# Patient Record
Sex: Female | Born: 1935
Health system: Southern US, Community
[De-identification: ages and names within clinical notes are randomized; demographics above are authoritative.]

## PROBLEM LIST (undated history)

## (undated) DIAGNOSIS — E079 Disorder of thyroid, unspecified: Secondary | ICD-10-CM

## (undated) DIAGNOSIS — N189 Chronic kidney disease, unspecified: Secondary | ICD-10-CM

## (undated) DIAGNOSIS — F32A Depression, unspecified: Secondary | ICD-10-CM

## (undated) DIAGNOSIS — I10 Essential (primary) hypertension: Secondary | ICD-10-CM

## (undated) DIAGNOSIS — L309 Dermatitis, unspecified: Secondary | ICD-10-CM

## (undated) DIAGNOSIS — F419 Anxiety disorder, unspecified: Secondary | ICD-10-CM

## (undated) DIAGNOSIS — F0390 Unspecified dementia without behavioral disturbance: Secondary | ICD-10-CM

## (undated) DIAGNOSIS — C801 Malignant (primary) neoplasm, unspecified: Secondary | ICD-10-CM

## (undated) DIAGNOSIS — J45909 Unspecified asthma, uncomplicated: Secondary | ICD-10-CM

## (undated) DIAGNOSIS — I639 Cerebral infarction, unspecified: Secondary | ICD-10-CM

## (undated) DIAGNOSIS — F329 Major depressive disorder, single episode, unspecified: Secondary | ICD-10-CM

## (undated) DIAGNOSIS — M1990 Unspecified osteoarthritis, unspecified site: Secondary | ICD-10-CM

## (undated) DIAGNOSIS — K219 Gastro-esophageal reflux disease without esophagitis: Secondary | ICD-10-CM

## (undated) DIAGNOSIS — Z1379 Encounter for other screening for genetic and chromosomal anomalies: Secondary | ICD-10-CM

## (undated) HISTORY — DX: Encounter for other screening for genetic and chromosomal anomalies: Z13.79

## (undated) HISTORY — PX: ABDOMINAL HYSTERECTOMY: SHX81

## (undated) HISTORY — DX: Malignant (primary) neoplasm, unspecified: C80.1

## (undated) HISTORY — PX: TONSILLECTOMY: SUR1361

## (undated) HISTORY — PX: CHOLECYSTECTOMY: SHX55

## (undated) HISTORY — PX: CATARACT EXTRACTION: SUR2

## (undated) HISTORY — PX: BREAST LUMPECTOMY: SHX2

## (undated) HISTORY — PX: LAMINECTOMY: SHX219

---

## 2004-10-11 ENCOUNTER — Ambulatory Visit: Payer: Self-pay

## 2004-11-28 ENCOUNTER — Other Ambulatory Visit: Payer: Self-pay

## 2004-12-13 ENCOUNTER — Inpatient Hospital Stay: Payer: Self-pay | Admitting: Orthopedic Surgery

## 2005-04-12 ENCOUNTER — Other Ambulatory Visit: Payer: Self-pay

## 2005-04-12 ENCOUNTER — Emergency Department: Payer: Self-pay | Admitting: Emergency Medicine

## 2005-05-14 ENCOUNTER — Other Ambulatory Visit: Payer: Self-pay

## 2005-05-14 ENCOUNTER — Emergency Department: Payer: Self-pay | Admitting: Emergency Medicine

## 2005-05-15 ENCOUNTER — Ambulatory Visit: Payer: Self-pay | Admitting: Emergency Medicine

## 2005-07-13 ENCOUNTER — Ambulatory Visit: Payer: Self-pay | Admitting: Internal Medicine

## 2005-10-17 ENCOUNTER — Ambulatory Visit: Payer: Self-pay | Admitting: Gastroenterology

## 2005-12-17 ENCOUNTER — Emergency Department: Payer: Self-pay | Admitting: Emergency Medicine

## 2006-01-10 ENCOUNTER — Ambulatory Visit: Payer: Self-pay | Admitting: Gastroenterology

## 2006-08-06 ENCOUNTER — Ambulatory Visit: Payer: Self-pay | Admitting: Internal Medicine

## 2006-08-07 ENCOUNTER — Emergency Department: Payer: Self-pay | Admitting: Emergency Medicine

## 2007-03-28 ENCOUNTER — Ambulatory Visit: Payer: Self-pay | Admitting: Internal Medicine

## 2007-09-16 ENCOUNTER — Ambulatory Visit: Payer: Self-pay | Admitting: Internal Medicine

## 2008-07-31 ENCOUNTER — Ambulatory Visit: Payer: Self-pay | Admitting: Specialist

## 2008-08-12 ENCOUNTER — Ambulatory Visit: Payer: Self-pay | Admitting: Specialist

## 2009-03-25 ENCOUNTER — Ambulatory Visit: Payer: Self-pay | Admitting: Internal Medicine

## 2009-11-10 ENCOUNTER — Ambulatory Visit: Payer: Self-pay | Admitting: Gastroenterology

## 2009-12-08 ENCOUNTER — Ambulatory Visit: Payer: Self-pay | Admitting: Gastroenterology

## 2010-03-01 ENCOUNTER — Ambulatory Visit: Payer: Self-pay | Admitting: Specialist

## 2010-03-31 ENCOUNTER — Ambulatory Visit: Payer: Self-pay | Admitting: Internal Medicine

## 2010-09-28 ENCOUNTER — Inpatient Hospital Stay: Payer: Self-pay | Admitting: Internal Medicine

## 2010-10-27 ENCOUNTER — Ambulatory Visit: Payer: Self-pay | Admitting: Nephrology

## 2011-05-10 DIAGNOSIS — M6281 Muscle weakness (generalized): Secondary | ICD-10-CM | POA: Diagnosis not present

## 2011-05-10 DIAGNOSIS — I10 Essential (primary) hypertension: Secondary | ICD-10-CM | POA: Diagnosis not present

## 2011-05-10 DIAGNOSIS — IMO0001 Reserved for inherently not codable concepts without codable children: Secondary | ICD-10-CM | POA: Diagnosis not present

## 2011-05-10 DIAGNOSIS — R262 Difficulty in walking, not elsewhere classified: Secondary | ICD-10-CM | POA: Diagnosis not present

## 2011-05-10 DIAGNOSIS — R569 Unspecified convulsions: Secondary | ICD-10-CM | POA: Diagnosis not present

## 2011-05-10 DIAGNOSIS — G309 Alzheimer's disease, unspecified: Secondary | ICD-10-CM | POA: Diagnosis not present

## 2011-05-10 DIAGNOSIS — J449 Chronic obstructive pulmonary disease, unspecified: Secondary | ICD-10-CM | POA: Diagnosis not present

## 2011-05-10 DIAGNOSIS — F0280 Dementia in other diseases classified elsewhere without behavioral disturbance: Secondary | ICD-10-CM | POA: Diagnosis not present

## 2011-05-10 DIAGNOSIS — Z87440 Personal history of urinary (tract) infections: Secondary | ICD-10-CM | POA: Diagnosis not present

## 2011-05-14 DIAGNOSIS — I10 Essential (primary) hypertension: Secondary | ICD-10-CM | POA: Diagnosis not present

## 2011-05-14 DIAGNOSIS — IMO0001 Reserved for inherently not codable concepts without codable children: Secondary | ICD-10-CM | POA: Diagnosis not present

## 2011-05-14 DIAGNOSIS — R262 Difficulty in walking, not elsewhere classified: Secondary | ICD-10-CM | POA: Diagnosis not present

## 2011-05-14 DIAGNOSIS — M6281 Muscle weakness (generalized): Secondary | ICD-10-CM | POA: Diagnosis not present

## 2011-05-14 DIAGNOSIS — J449 Chronic obstructive pulmonary disease, unspecified: Secondary | ICD-10-CM | POA: Diagnosis not present

## 2011-05-14 DIAGNOSIS — G309 Alzheimer's disease, unspecified: Secondary | ICD-10-CM | POA: Diagnosis not present

## 2011-05-16 DIAGNOSIS — G309 Alzheimer's disease, unspecified: Secondary | ICD-10-CM | POA: Diagnosis not present

## 2011-05-16 DIAGNOSIS — I10 Essential (primary) hypertension: Secondary | ICD-10-CM | POA: Diagnosis not present

## 2011-05-16 DIAGNOSIS — J449 Chronic obstructive pulmonary disease, unspecified: Secondary | ICD-10-CM | POA: Diagnosis not present

## 2011-05-16 DIAGNOSIS — IMO0001 Reserved for inherently not codable concepts without codable children: Secondary | ICD-10-CM | POA: Diagnosis not present

## 2011-05-16 DIAGNOSIS — M6281 Muscle weakness (generalized): Secondary | ICD-10-CM | POA: Diagnosis not present

## 2011-05-16 DIAGNOSIS — R262 Difficulty in walking, not elsewhere classified: Secondary | ICD-10-CM | POA: Diagnosis not present

## 2011-05-22 DIAGNOSIS — IMO0001 Reserved for inherently not codable concepts without codable children: Secondary | ICD-10-CM | POA: Diagnosis not present

## 2011-05-22 DIAGNOSIS — R262 Difficulty in walking, not elsewhere classified: Secondary | ICD-10-CM | POA: Diagnosis not present

## 2011-05-22 DIAGNOSIS — G309 Alzheimer's disease, unspecified: Secondary | ICD-10-CM | POA: Diagnosis not present

## 2011-05-22 DIAGNOSIS — I10 Essential (primary) hypertension: Secondary | ICD-10-CM | POA: Diagnosis not present

## 2011-05-22 DIAGNOSIS — J449 Chronic obstructive pulmonary disease, unspecified: Secondary | ICD-10-CM | POA: Diagnosis not present

## 2011-05-22 DIAGNOSIS — M6281 Muscle weakness (generalized): Secondary | ICD-10-CM | POA: Diagnosis not present

## 2011-05-24 DIAGNOSIS — J449 Chronic obstructive pulmonary disease, unspecified: Secondary | ICD-10-CM | POA: Diagnosis not present

## 2011-05-24 DIAGNOSIS — F0280 Dementia in other diseases classified elsewhere without behavioral disturbance: Secondary | ICD-10-CM | POA: Diagnosis not present

## 2011-05-24 DIAGNOSIS — R262 Difficulty in walking, not elsewhere classified: Secondary | ICD-10-CM | POA: Diagnosis not present

## 2011-05-24 DIAGNOSIS — I10 Essential (primary) hypertension: Secondary | ICD-10-CM | POA: Diagnosis not present

## 2011-05-24 DIAGNOSIS — IMO0001 Reserved for inherently not codable concepts without codable children: Secondary | ICD-10-CM | POA: Diagnosis not present

## 2011-05-24 DIAGNOSIS — M6281 Muscle weakness (generalized): Secondary | ICD-10-CM | POA: Diagnosis not present

## 2011-05-25 DIAGNOSIS — IMO0001 Reserved for inherently not codable concepts without codable children: Secondary | ICD-10-CM | POA: Diagnosis not present

## 2011-05-25 DIAGNOSIS — J449 Chronic obstructive pulmonary disease, unspecified: Secondary | ICD-10-CM | POA: Diagnosis not present

## 2011-05-25 DIAGNOSIS — I10 Essential (primary) hypertension: Secondary | ICD-10-CM | POA: Diagnosis not present

## 2011-05-25 DIAGNOSIS — M6281 Muscle weakness (generalized): Secondary | ICD-10-CM | POA: Diagnosis not present

## 2011-05-25 DIAGNOSIS — R262 Difficulty in walking, not elsewhere classified: Secondary | ICD-10-CM | POA: Diagnosis not present

## 2011-05-25 DIAGNOSIS — G309 Alzheimer's disease, unspecified: Secondary | ICD-10-CM | POA: Diagnosis not present

## 2011-05-30 DIAGNOSIS — IMO0001 Reserved for inherently not codable concepts without codable children: Secondary | ICD-10-CM | POA: Diagnosis not present

## 2011-05-30 DIAGNOSIS — M6281 Muscle weakness (generalized): Secondary | ICD-10-CM | POA: Diagnosis not present

## 2011-05-30 DIAGNOSIS — G309 Alzheimer's disease, unspecified: Secondary | ICD-10-CM | POA: Diagnosis not present

## 2011-05-30 DIAGNOSIS — I10 Essential (primary) hypertension: Secondary | ICD-10-CM | POA: Diagnosis not present

## 2011-05-30 DIAGNOSIS — R262 Difficulty in walking, not elsewhere classified: Secondary | ICD-10-CM | POA: Diagnosis not present

## 2011-05-30 DIAGNOSIS — J449 Chronic obstructive pulmonary disease, unspecified: Secondary | ICD-10-CM | POA: Diagnosis not present

## 2011-05-31 DIAGNOSIS — G309 Alzheimer's disease, unspecified: Secondary | ICD-10-CM | POA: Diagnosis not present

## 2011-05-31 DIAGNOSIS — IMO0001 Reserved for inherently not codable concepts without codable children: Secondary | ICD-10-CM | POA: Diagnosis not present

## 2011-05-31 DIAGNOSIS — R262 Difficulty in walking, not elsewhere classified: Secondary | ICD-10-CM | POA: Diagnosis not present

## 2011-05-31 DIAGNOSIS — J449 Chronic obstructive pulmonary disease, unspecified: Secondary | ICD-10-CM | POA: Diagnosis not present

## 2011-05-31 DIAGNOSIS — I10 Essential (primary) hypertension: Secondary | ICD-10-CM | POA: Diagnosis not present

## 2011-05-31 DIAGNOSIS — M6281 Muscle weakness (generalized): Secondary | ICD-10-CM | POA: Diagnosis not present

## 2011-06-01 DIAGNOSIS — R262 Difficulty in walking, not elsewhere classified: Secondary | ICD-10-CM | POA: Diagnosis not present

## 2011-06-01 DIAGNOSIS — G309 Alzheimer's disease, unspecified: Secondary | ICD-10-CM | POA: Diagnosis not present

## 2011-06-01 DIAGNOSIS — I10 Essential (primary) hypertension: Secondary | ICD-10-CM | POA: Diagnosis not present

## 2011-06-01 DIAGNOSIS — J449 Chronic obstructive pulmonary disease, unspecified: Secondary | ICD-10-CM | POA: Diagnosis not present

## 2011-06-01 DIAGNOSIS — M6281 Muscle weakness (generalized): Secondary | ICD-10-CM | POA: Diagnosis not present

## 2011-06-01 DIAGNOSIS — IMO0001 Reserved for inherently not codable concepts without codable children: Secondary | ICD-10-CM | POA: Diagnosis not present

## 2011-06-05 DIAGNOSIS — F0280 Dementia in other diseases classified elsewhere without behavioral disturbance: Secondary | ICD-10-CM | POA: Diagnosis not present

## 2011-06-05 DIAGNOSIS — I10 Essential (primary) hypertension: Secondary | ICD-10-CM | POA: Diagnosis not present

## 2011-06-05 DIAGNOSIS — J449 Chronic obstructive pulmonary disease, unspecified: Secondary | ICD-10-CM | POA: Diagnosis not present

## 2011-06-05 DIAGNOSIS — R262 Difficulty in walking, not elsewhere classified: Secondary | ICD-10-CM | POA: Diagnosis not present

## 2011-06-05 DIAGNOSIS — IMO0001 Reserved for inherently not codable concepts without codable children: Secondary | ICD-10-CM | POA: Diagnosis not present

## 2011-06-05 DIAGNOSIS — M6281 Muscle weakness (generalized): Secondary | ICD-10-CM | POA: Diagnosis not present

## 2011-06-06 DIAGNOSIS — IMO0001 Reserved for inherently not codable concepts without codable children: Secondary | ICD-10-CM | POA: Diagnosis not present

## 2011-06-06 DIAGNOSIS — I10 Essential (primary) hypertension: Secondary | ICD-10-CM | POA: Diagnosis not present

## 2011-06-06 DIAGNOSIS — G309 Alzheimer's disease, unspecified: Secondary | ICD-10-CM | POA: Diagnosis not present

## 2011-06-06 DIAGNOSIS — M6281 Muscle weakness (generalized): Secondary | ICD-10-CM | POA: Diagnosis not present

## 2011-06-06 DIAGNOSIS — R262 Difficulty in walking, not elsewhere classified: Secondary | ICD-10-CM | POA: Diagnosis not present

## 2011-06-06 DIAGNOSIS — J449 Chronic obstructive pulmonary disease, unspecified: Secondary | ICD-10-CM | POA: Diagnosis not present

## 2011-06-14 DIAGNOSIS — M6281 Muscle weakness (generalized): Secondary | ICD-10-CM | POA: Diagnosis not present

## 2011-06-14 DIAGNOSIS — IMO0001 Reserved for inherently not codable concepts without codable children: Secondary | ICD-10-CM | POA: Diagnosis not present

## 2011-06-14 DIAGNOSIS — J449 Chronic obstructive pulmonary disease, unspecified: Secondary | ICD-10-CM | POA: Diagnosis not present

## 2011-06-14 DIAGNOSIS — G309 Alzheimer's disease, unspecified: Secondary | ICD-10-CM | POA: Diagnosis not present

## 2011-06-14 DIAGNOSIS — I10 Essential (primary) hypertension: Secondary | ICD-10-CM | POA: Diagnosis not present

## 2011-06-14 DIAGNOSIS — R262 Difficulty in walking, not elsewhere classified: Secondary | ICD-10-CM | POA: Diagnosis not present

## 2011-06-15 DIAGNOSIS — IMO0001 Reserved for inherently not codable concepts without codable children: Secondary | ICD-10-CM | POA: Diagnosis not present

## 2011-06-15 DIAGNOSIS — J449 Chronic obstructive pulmonary disease, unspecified: Secondary | ICD-10-CM | POA: Diagnosis not present

## 2011-06-15 DIAGNOSIS — M6281 Muscle weakness (generalized): Secondary | ICD-10-CM | POA: Diagnosis not present

## 2011-06-15 DIAGNOSIS — R262 Difficulty in walking, not elsewhere classified: Secondary | ICD-10-CM | POA: Diagnosis not present

## 2011-06-15 DIAGNOSIS — I10 Essential (primary) hypertension: Secondary | ICD-10-CM | POA: Diagnosis not present

## 2011-06-15 DIAGNOSIS — G309 Alzheimer's disease, unspecified: Secondary | ICD-10-CM | POA: Diagnosis not present

## 2011-06-16 DIAGNOSIS — R262 Difficulty in walking, not elsewhere classified: Secondary | ICD-10-CM | POA: Diagnosis not present

## 2011-06-16 DIAGNOSIS — IMO0001 Reserved for inherently not codable concepts without codable children: Secondary | ICD-10-CM | POA: Diagnosis not present

## 2011-06-16 DIAGNOSIS — J449 Chronic obstructive pulmonary disease, unspecified: Secondary | ICD-10-CM | POA: Diagnosis not present

## 2011-06-16 DIAGNOSIS — M6281 Muscle weakness (generalized): Secondary | ICD-10-CM | POA: Diagnosis not present

## 2011-06-16 DIAGNOSIS — F0280 Dementia in other diseases classified elsewhere without behavioral disturbance: Secondary | ICD-10-CM | POA: Diagnosis not present

## 2011-06-16 DIAGNOSIS — I10 Essential (primary) hypertension: Secondary | ICD-10-CM | POA: Diagnosis not present

## 2011-06-21 DIAGNOSIS — IMO0001 Reserved for inherently not codable concepts without codable children: Secondary | ICD-10-CM | POA: Diagnosis not present

## 2011-06-21 DIAGNOSIS — R262 Difficulty in walking, not elsewhere classified: Secondary | ICD-10-CM | POA: Diagnosis not present

## 2011-06-21 DIAGNOSIS — J449 Chronic obstructive pulmonary disease, unspecified: Secondary | ICD-10-CM | POA: Diagnosis not present

## 2011-06-21 DIAGNOSIS — I10 Essential (primary) hypertension: Secondary | ICD-10-CM | POA: Diagnosis not present

## 2011-06-21 DIAGNOSIS — F0280 Dementia in other diseases classified elsewhere without behavioral disturbance: Secondary | ICD-10-CM | POA: Diagnosis not present

## 2011-06-21 DIAGNOSIS — M6281 Muscle weakness (generalized): Secondary | ICD-10-CM | POA: Diagnosis not present

## 2011-06-28 DIAGNOSIS — F0280 Dementia in other diseases classified elsewhere without behavioral disturbance: Secondary | ICD-10-CM | POA: Diagnosis not present

## 2011-06-28 DIAGNOSIS — J449 Chronic obstructive pulmonary disease, unspecified: Secondary | ICD-10-CM | POA: Diagnosis not present

## 2011-06-28 DIAGNOSIS — R262 Difficulty in walking, not elsewhere classified: Secondary | ICD-10-CM | POA: Diagnosis not present

## 2011-06-28 DIAGNOSIS — M6281 Muscle weakness (generalized): Secondary | ICD-10-CM | POA: Diagnosis not present

## 2011-06-28 DIAGNOSIS — IMO0001 Reserved for inherently not codable concepts without codable children: Secondary | ICD-10-CM | POA: Diagnosis not present

## 2011-06-28 DIAGNOSIS — I10 Essential (primary) hypertension: Secondary | ICD-10-CM | POA: Diagnosis not present

## 2011-07-05 DIAGNOSIS — J449 Chronic obstructive pulmonary disease, unspecified: Secondary | ICD-10-CM | POA: Diagnosis not present

## 2011-07-05 DIAGNOSIS — G309 Alzheimer's disease, unspecified: Secondary | ICD-10-CM | POA: Diagnosis not present

## 2011-07-05 DIAGNOSIS — M6281 Muscle weakness (generalized): Secondary | ICD-10-CM | POA: Diagnosis not present

## 2011-07-05 DIAGNOSIS — IMO0001 Reserved for inherently not codable concepts without codable children: Secondary | ICD-10-CM | POA: Diagnosis not present

## 2011-07-05 DIAGNOSIS — I10 Essential (primary) hypertension: Secondary | ICD-10-CM | POA: Diagnosis not present

## 2011-07-05 DIAGNOSIS — R262 Difficulty in walking, not elsewhere classified: Secondary | ICD-10-CM | POA: Diagnosis not present

## 2011-07-11 DIAGNOSIS — B351 Tinea unguium: Secondary | ICD-10-CM | POA: Diagnosis not present

## 2011-07-11 DIAGNOSIS — M79609 Pain in unspecified limb: Secondary | ICD-10-CM | POA: Diagnosis not present

## 2011-07-14 DIAGNOSIS — M19019 Primary osteoarthritis, unspecified shoulder: Secondary | ICD-10-CM | POA: Diagnosis not present

## 2011-07-14 DIAGNOSIS — S40019A Contusion of unspecified shoulder, initial encounter: Secondary | ICD-10-CM | POA: Diagnosis not present

## 2011-07-18 DIAGNOSIS — L578 Other skin changes due to chronic exposure to nonionizing radiation: Secondary | ICD-10-CM | POA: Diagnosis not present

## 2011-07-18 DIAGNOSIS — L408 Other psoriasis: Secondary | ICD-10-CM | POA: Diagnosis not present

## 2011-07-27 DIAGNOSIS — R609 Edema, unspecified: Secondary | ICD-10-CM | POA: Diagnosis not present

## 2011-07-27 DIAGNOSIS — E782 Mixed hyperlipidemia: Secondary | ICD-10-CM | POA: Diagnosis not present

## 2011-07-27 DIAGNOSIS — N393 Stress incontinence (female) (male): Secondary | ICD-10-CM | POA: Diagnosis not present

## 2011-07-27 DIAGNOSIS — E039 Hypothyroidism, unspecified: Secondary | ICD-10-CM | POA: Diagnosis not present

## 2011-07-27 DIAGNOSIS — I10 Essential (primary) hypertension: Secondary | ICD-10-CM | POA: Diagnosis not present

## 2011-07-27 DIAGNOSIS — K219 Gastro-esophageal reflux disease without esophagitis: Secondary | ICD-10-CM | POA: Diagnosis not present

## 2011-08-22 DIAGNOSIS — J45909 Unspecified asthma, uncomplicated: Secondary | ICD-10-CM | POA: Diagnosis not present

## 2011-08-22 DIAGNOSIS — R5383 Other fatigue: Secondary | ICD-10-CM | POA: Diagnosis not present

## 2011-08-22 DIAGNOSIS — H6690 Otitis media, unspecified, unspecified ear: Secondary | ICD-10-CM | POA: Diagnosis not present

## 2011-08-22 DIAGNOSIS — I10 Essential (primary) hypertension: Secondary | ICD-10-CM | POA: Diagnosis not present

## 2011-08-22 DIAGNOSIS — F329 Major depressive disorder, single episode, unspecified: Secondary | ICD-10-CM | POA: Diagnosis not present

## 2011-08-22 DIAGNOSIS — F411 Generalized anxiety disorder: Secondary | ICD-10-CM | POA: Diagnosis not present

## 2011-08-22 DIAGNOSIS — R300 Dysuria: Secondary | ICD-10-CM | POA: Diagnosis not present

## 2011-08-22 DIAGNOSIS — R5381 Other malaise: Secondary | ICD-10-CM | POA: Diagnosis not present

## 2011-08-28 DIAGNOSIS — N3946 Mixed incontinence: Secondary | ICD-10-CM | POA: Diagnosis not present

## 2011-09-01 DIAGNOSIS — E559 Vitamin D deficiency, unspecified: Secondary | ICD-10-CM | POA: Diagnosis not present

## 2011-09-01 DIAGNOSIS — R609 Edema, unspecified: Secondary | ICD-10-CM | POA: Diagnosis not present

## 2011-09-01 DIAGNOSIS — I10 Essential (primary) hypertension: Secondary | ICD-10-CM | POA: Diagnosis not present

## 2011-09-06 DIAGNOSIS — N390 Urinary tract infection, site not specified: Secondary | ICD-10-CM | POA: Diagnosis not present

## 2011-09-11 DIAGNOSIS — M79609 Pain in unspecified limb: Secondary | ICD-10-CM | POA: Diagnosis not present

## 2011-09-11 DIAGNOSIS — B351 Tinea unguium: Secondary | ICD-10-CM | POA: Diagnosis not present

## 2011-09-12 DIAGNOSIS — R262 Difficulty in walking, not elsewhere classified: Secondary | ICD-10-CM | POA: Diagnosis not present

## 2011-09-12 DIAGNOSIS — R32 Unspecified urinary incontinence: Secondary | ICD-10-CM | POA: Diagnosis not present

## 2011-09-12 DIAGNOSIS — L909 Atrophic disorder of skin, unspecified: Secondary | ICD-10-CM | POA: Diagnosis not present

## 2011-09-12 DIAGNOSIS — N390 Urinary tract infection, site not specified: Secondary | ICD-10-CM | POA: Diagnosis not present

## 2011-09-12 DIAGNOSIS — R609 Edema, unspecified: Secondary | ICD-10-CM | POA: Diagnosis not present

## 2011-09-12 DIAGNOSIS — M1990 Unspecified osteoarthritis, unspecified site: Secondary | ICD-10-CM | POA: Diagnosis not present

## 2011-09-12 DIAGNOSIS — F0280 Dementia in other diseases classified elsewhere without behavioral disturbance: Secondary | ICD-10-CM | POA: Diagnosis not present

## 2011-09-12 DIAGNOSIS — J449 Chronic obstructive pulmonary disease, unspecified: Secondary | ICD-10-CM | POA: Diagnosis not present

## 2011-09-12 DIAGNOSIS — M810 Age-related osteoporosis without current pathological fracture: Secondary | ICD-10-CM | POA: Diagnosis not present

## 2011-09-12 DIAGNOSIS — Z8673 Personal history of transient ischemic attack (TIA), and cerebral infarction without residual deficits: Secondary | ICD-10-CM | POA: Diagnosis not present

## 2011-09-12 DIAGNOSIS — M6281 Muscle weakness (generalized): Secondary | ICD-10-CM | POA: Diagnosis not present

## 2011-09-12 DIAGNOSIS — F411 Generalized anxiety disorder: Secondary | ICD-10-CM | POA: Diagnosis not present

## 2011-09-12 DIAGNOSIS — Z9181 History of falling: Secondary | ICD-10-CM | POA: Diagnosis not present

## 2011-09-12 DIAGNOSIS — G309 Alzheimer's disease, unspecified: Secondary | ICD-10-CM | POA: Diagnosis not present

## 2011-09-13 DIAGNOSIS — R609 Edema, unspecified: Secondary | ICD-10-CM | POA: Diagnosis not present

## 2011-09-13 DIAGNOSIS — M1990 Unspecified osteoarthritis, unspecified site: Secondary | ICD-10-CM | POA: Diagnosis not present

## 2011-09-13 DIAGNOSIS — G309 Alzheimer's disease, unspecified: Secondary | ICD-10-CM | POA: Diagnosis not present

## 2011-09-13 DIAGNOSIS — F0280 Dementia in other diseases classified elsewhere without behavioral disturbance: Secondary | ICD-10-CM | POA: Diagnosis not present

## 2011-09-13 DIAGNOSIS — J449 Chronic obstructive pulmonary disease, unspecified: Secondary | ICD-10-CM | POA: Diagnosis not present

## 2011-09-13 DIAGNOSIS — N390 Urinary tract infection, site not specified: Secondary | ICD-10-CM | POA: Diagnosis not present

## 2011-09-14 ENCOUNTER — Ambulatory Visit: Payer: Self-pay | Admitting: Internal Medicine

## 2011-09-14 DIAGNOSIS — F0280 Dementia in other diseases classified elsewhere without behavioral disturbance: Secondary | ICD-10-CM | POA: Diagnosis not present

## 2011-09-14 DIAGNOSIS — N390 Urinary tract infection, site not specified: Secondary | ICD-10-CM | POA: Diagnosis not present

## 2011-09-14 DIAGNOSIS — N6489 Other specified disorders of breast: Secondary | ICD-10-CM | POA: Diagnosis not present

## 2011-09-14 DIAGNOSIS — R609 Edema, unspecified: Secondary | ICD-10-CM | POA: Diagnosis not present

## 2011-09-14 DIAGNOSIS — J449 Chronic obstructive pulmonary disease, unspecified: Secondary | ICD-10-CM | POA: Diagnosis not present

## 2011-09-14 DIAGNOSIS — Z853 Personal history of malignant neoplasm of breast: Secondary | ICD-10-CM | POA: Diagnosis not present

## 2011-09-14 DIAGNOSIS — G309 Alzheimer's disease, unspecified: Secondary | ICD-10-CM | POA: Diagnosis not present

## 2011-09-14 DIAGNOSIS — M1990 Unspecified osteoarthritis, unspecified site: Secondary | ICD-10-CM | POA: Diagnosis not present

## 2011-09-15 DIAGNOSIS — R609 Edema, unspecified: Secondary | ICD-10-CM | POA: Diagnosis not present

## 2011-09-15 DIAGNOSIS — M1990 Unspecified osteoarthritis, unspecified site: Secondary | ICD-10-CM | POA: Diagnosis not present

## 2011-09-15 DIAGNOSIS — F0280 Dementia in other diseases classified elsewhere without behavioral disturbance: Secondary | ICD-10-CM | POA: Diagnosis not present

## 2011-09-15 DIAGNOSIS — J449 Chronic obstructive pulmonary disease, unspecified: Secondary | ICD-10-CM | POA: Diagnosis not present

## 2011-09-15 DIAGNOSIS — N390 Urinary tract infection, site not specified: Secondary | ICD-10-CM | POA: Diagnosis not present

## 2011-09-18 DIAGNOSIS — N3946 Mixed incontinence: Secondary | ICD-10-CM | POA: Diagnosis not present

## 2011-09-18 DIAGNOSIS — F0280 Dementia in other diseases classified elsewhere without behavioral disturbance: Secondary | ICD-10-CM | POA: Diagnosis not present

## 2011-09-18 DIAGNOSIS — M1990 Unspecified osteoarthritis, unspecified site: Secondary | ICD-10-CM | POA: Diagnosis not present

## 2011-09-18 DIAGNOSIS — R609 Edema, unspecified: Secondary | ICD-10-CM | POA: Diagnosis not present

## 2011-09-18 DIAGNOSIS — N390 Urinary tract infection, site not specified: Secondary | ICD-10-CM | POA: Diagnosis not present

## 2011-09-18 DIAGNOSIS — J449 Chronic obstructive pulmonary disease, unspecified: Secondary | ICD-10-CM | POA: Diagnosis not present

## 2011-09-19 DIAGNOSIS — F0280 Dementia in other diseases classified elsewhere without behavioral disturbance: Secondary | ICD-10-CM | POA: Diagnosis not present

## 2011-09-19 DIAGNOSIS — N390 Urinary tract infection, site not specified: Secondary | ICD-10-CM | POA: Diagnosis not present

## 2011-09-19 DIAGNOSIS — M1990 Unspecified osteoarthritis, unspecified site: Secondary | ICD-10-CM | POA: Diagnosis not present

## 2011-09-19 DIAGNOSIS — R609 Edema, unspecified: Secondary | ICD-10-CM | POA: Diagnosis not present

## 2011-09-19 DIAGNOSIS — J449 Chronic obstructive pulmonary disease, unspecified: Secondary | ICD-10-CM | POA: Diagnosis not present

## 2011-09-21 DIAGNOSIS — G309 Alzheimer's disease, unspecified: Secondary | ICD-10-CM | POA: Diagnosis not present

## 2011-09-21 DIAGNOSIS — F0280 Dementia in other diseases classified elsewhere without behavioral disturbance: Secondary | ICD-10-CM | POA: Diagnosis not present

## 2011-09-21 DIAGNOSIS — R609 Edema, unspecified: Secondary | ICD-10-CM | POA: Diagnosis not present

## 2011-09-21 DIAGNOSIS — M1990 Unspecified osteoarthritis, unspecified site: Secondary | ICD-10-CM | POA: Diagnosis not present

## 2011-09-21 DIAGNOSIS — J449 Chronic obstructive pulmonary disease, unspecified: Secondary | ICD-10-CM | POA: Diagnosis not present

## 2011-09-21 DIAGNOSIS — N390 Urinary tract infection, site not specified: Secondary | ICD-10-CM | POA: Diagnosis not present

## 2011-09-22 DIAGNOSIS — G309 Alzheimer's disease, unspecified: Secondary | ICD-10-CM | POA: Diagnosis not present

## 2011-09-22 DIAGNOSIS — F0280 Dementia in other diseases classified elsewhere without behavioral disturbance: Secondary | ICD-10-CM | POA: Diagnosis not present

## 2011-09-22 DIAGNOSIS — M1990 Unspecified osteoarthritis, unspecified site: Secondary | ICD-10-CM | POA: Diagnosis not present

## 2011-09-22 DIAGNOSIS — J449 Chronic obstructive pulmonary disease, unspecified: Secondary | ICD-10-CM | POA: Diagnosis not present

## 2011-09-22 DIAGNOSIS — R609 Edema, unspecified: Secondary | ICD-10-CM | POA: Diagnosis not present

## 2011-09-22 DIAGNOSIS — N390 Urinary tract infection, site not specified: Secondary | ICD-10-CM | POA: Diagnosis not present

## 2011-09-25 DIAGNOSIS — J449 Chronic obstructive pulmonary disease, unspecified: Secondary | ICD-10-CM | POA: Diagnosis not present

## 2011-09-25 DIAGNOSIS — F0280 Dementia in other diseases classified elsewhere without behavioral disturbance: Secondary | ICD-10-CM | POA: Diagnosis not present

## 2011-09-25 DIAGNOSIS — N390 Urinary tract infection, site not specified: Secondary | ICD-10-CM | POA: Diagnosis not present

## 2011-09-25 DIAGNOSIS — M1990 Unspecified osteoarthritis, unspecified site: Secondary | ICD-10-CM | POA: Diagnosis not present

## 2011-09-25 DIAGNOSIS — R609 Edema, unspecified: Secondary | ICD-10-CM | POA: Diagnosis not present

## 2011-09-27 DIAGNOSIS — R609 Edema, unspecified: Secondary | ICD-10-CM | POA: Diagnosis not present

## 2011-09-27 DIAGNOSIS — J449 Chronic obstructive pulmonary disease, unspecified: Secondary | ICD-10-CM | POA: Diagnosis not present

## 2011-09-27 DIAGNOSIS — F0280 Dementia in other diseases classified elsewhere without behavioral disturbance: Secondary | ICD-10-CM | POA: Diagnosis not present

## 2011-09-27 DIAGNOSIS — M1990 Unspecified osteoarthritis, unspecified site: Secondary | ICD-10-CM | POA: Diagnosis not present

## 2011-09-27 DIAGNOSIS — G309 Alzheimer's disease, unspecified: Secondary | ICD-10-CM | POA: Diagnosis not present

## 2011-09-27 DIAGNOSIS — N390 Urinary tract infection, site not specified: Secondary | ICD-10-CM | POA: Diagnosis not present

## 2011-10-02 DIAGNOSIS — I10 Essential (primary) hypertension: Secondary | ICD-10-CM | POA: Diagnosis not present

## 2011-10-02 DIAGNOSIS — F329 Major depressive disorder, single episode, unspecified: Secondary | ICD-10-CM | POA: Diagnosis not present

## 2011-10-03 DIAGNOSIS — F0280 Dementia in other diseases classified elsewhere without behavioral disturbance: Secondary | ICD-10-CM | POA: Diagnosis not present

## 2011-10-03 DIAGNOSIS — R609 Edema, unspecified: Secondary | ICD-10-CM | POA: Diagnosis not present

## 2011-10-03 DIAGNOSIS — M1990 Unspecified osteoarthritis, unspecified site: Secondary | ICD-10-CM | POA: Diagnosis not present

## 2011-10-03 DIAGNOSIS — J449 Chronic obstructive pulmonary disease, unspecified: Secondary | ICD-10-CM | POA: Diagnosis not present

## 2011-10-03 DIAGNOSIS — N390 Urinary tract infection, site not specified: Secondary | ICD-10-CM | POA: Diagnosis not present

## 2011-10-04 DIAGNOSIS — J449 Chronic obstructive pulmonary disease, unspecified: Secondary | ICD-10-CM | POA: Diagnosis not present

## 2011-10-04 DIAGNOSIS — M1990 Unspecified osteoarthritis, unspecified site: Secondary | ICD-10-CM | POA: Diagnosis not present

## 2011-10-04 DIAGNOSIS — N390 Urinary tract infection, site not specified: Secondary | ICD-10-CM | POA: Diagnosis not present

## 2011-10-04 DIAGNOSIS — G309 Alzheimer's disease, unspecified: Secondary | ICD-10-CM | POA: Diagnosis not present

## 2011-10-04 DIAGNOSIS — R609 Edema, unspecified: Secondary | ICD-10-CM | POA: Diagnosis not present

## 2011-10-04 DIAGNOSIS — F0280 Dementia in other diseases classified elsewhere without behavioral disturbance: Secondary | ICD-10-CM | POA: Diagnosis not present

## 2011-10-10 DIAGNOSIS — G309 Alzheimer's disease, unspecified: Secondary | ICD-10-CM | POA: Diagnosis not present

## 2011-10-10 DIAGNOSIS — M1990 Unspecified osteoarthritis, unspecified site: Secondary | ICD-10-CM | POA: Diagnosis not present

## 2011-10-10 DIAGNOSIS — N390 Urinary tract infection, site not specified: Secondary | ICD-10-CM | POA: Diagnosis not present

## 2011-10-10 DIAGNOSIS — J449 Chronic obstructive pulmonary disease, unspecified: Secondary | ICD-10-CM | POA: Diagnosis not present

## 2011-10-10 DIAGNOSIS — F0280 Dementia in other diseases classified elsewhere without behavioral disturbance: Secondary | ICD-10-CM | POA: Diagnosis not present

## 2011-10-10 DIAGNOSIS — R609 Edema, unspecified: Secondary | ICD-10-CM | POA: Diagnosis not present

## 2011-10-11 DIAGNOSIS — J449 Chronic obstructive pulmonary disease, unspecified: Secondary | ICD-10-CM | POA: Diagnosis not present

## 2011-10-11 DIAGNOSIS — R609 Edema, unspecified: Secondary | ICD-10-CM | POA: Diagnosis not present

## 2011-10-11 DIAGNOSIS — M1990 Unspecified osteoarthritis, unspecified site: Secondary | ICD-10-CM | POA: Diagnosis not present

## 2011-10-11 DIAGNOSIS — F0280 Dementia in other diseases classified elsewhere without behavioral disturbance: Secondary | ICD-10-CM | POA: Diagnosis not present

## 2011-10-11 DIAGNOSIS — N390 Urinary tract infection, site not specified: Secondary | ICD-10-CM | POA: Diagnosis not present

## 2011-10-16 DIAGNOSIS — H353290 Exudative age-related macular degeneration, unspecified eye, stage unspecified: Secondary | ICD-10-CM | POA: Diagnosis not present

## 2011-10-17 DIAGNOSIS — R609 Edema, unspecified: Secondary | ICD-10-CM | POA: Diagnosis not present

## 2011-10-17 DIAGNOSIS — F0280 Dementia in other diseases classified elsewhere without behavioral disturbance: Secondary | ICD-10-CM | POA: Diagnosis not present

## 2011-10-17 DIAGNOSIS — N390 Urinary tract infection, site not specified: Secondary | ICD-10-CM | POA: Diagnosis not present

## 2011-10-17 DIAGNOSIS — M1990 Unspecified osteoarthritis, unspecified site: Secondary | ICD-10-CM | POA: Diagnosis not present

## 2011-10-17 DIAGNOSIS — J449 Chronic obstructive pulmonary disease, unspecified: Secondary | ICD-10-CM | POA: Diagnosis not present

## 2011-10-18 DIAGNOSIS — R609 Edema, unspecified: Secondary | ICD-10-CM | POA: Diagnosis not present

## 2011-10-18 DIAGNOSIS — M1990 Unspecified osteoarthritis, unspecified site: Secondary | ICD-10-CM | POA: Diagnosis not present

## 2011-10-18 DIAGNOSIS — J449 Chronic obstructive pulmonary disease, unspecified: Secondary | ICD-10-CM | POA: Diagnosis not present

## 2011-10-18 DIAGNOSIS — N390 Urinary tract infection, site not specified: Secondary | ICD-10-CM | POA: Diagnosis not present

## 2011-10-18 DIAGNOSIS — F0280 Dementia in other diseases classified elsewhere without behavioral disturbance: Secondary | ICD-10-CM | POA: Diagnosis not present

## 2011-10-23 DIAGNOSIS — R609 Edema, unspecified: Secondary | ICD-10-CM | POA: Diagnosis not present

## 2011-10-23 DIAGNOSIS — M1990 Unspecified osteoarthritis, unspecified site: Secondary | ICD-10-CM | POA: Diagnosis not present

## 2011-10-23 DIAGNOSIS — N390 Urinary tract infection, site not specified: Secondary | ICD-10-CM | POA: Diagnosis not present

## 2011-10-23 DIAGNOSIS — J449 Chronic obstructive pulmonary disease, unspecified: Secondary | ICD-10-CM | POA: Diagnosis not present

## 2011-10-23 DIAGNOSIS — F0280 Dementia in other diseases classified elsewhere without behavioral disturbance: Secondary | ICD-10-CM | POA: Diagnosis not present

## 2011-10-25 DIAGNOSIS — N390 Urinary tract infection, site not specified: Secondary | ICD-10-CM | POA: Diagnosis not present

## 2011-10-25 DIAGNOSIS — R609 Edema, unspecified: Secondary | ICD-10-CM | POA: Diagnosis not present

## 2011-10-25 DIAGNOSIS — J449 Chronic obstructive pulmonary disease, unspecified: Secondary | ICD-10-CM | POA: Diagnosis not present

## 2011-10-25 DIAGNOSIS — F0280 Dementia in other diseases classified elsewhere without behavioral disturbance: Secondary | ICD-10-CM | POA: Diagnosis not present

## 2011-10-25 DIAGNOSIS — M1990 Unspecified osteoarthritis, unspecified site: Secondary | ICD-10-CM | POA: Diagnosis not present

## 2011-10-31 DIAGNOSIS — M1990 Unspecified osteoarthritis, unspecified site: Secondary | ICD-10-CM | POA: Diagnosis not present

## 2011-10-31 DIAGNOSIS — N390 Urinary tract infection, site not specified: Secondary | ICD-10-CM | POA: Diagnosis not present

## 2011-10-31 DIAGNOSIS — R609 Edema, unspecified: Secondary | ICD-10-CM | POA: Diagnosis not present

## 2011-10-31 DIAGNOSIS — F0280 Dementia in other diseases classified elsewhere without behavioral disturbance: Secondary | ICD-10-CM | POA: Diagnosis not present

## 2011-10-31 DIAGNOSIS — J449 Chronic obstructive pulmonary disease, unspecified: Secondary | ICD-10-CM | POA: Diagnosis not present

## 2011-10-31 DIAGNOSIS — G309 Alzheimer's disease, unspecified: Secondary | ICD-10-CM | POA: Diagnosis not present

## 2011-11-01 DIAGNOSIS — J449 Chronic obstructive pulmonary disease, unspecified: Secondary | ICD-10-CM | POA: Diagnosis not present

## 2011-11-01 DIAGNOSIS — E782 Mixed hyperlipidemia: Secondary | ICD-10-CM | POA: Diagnosis not present

## 2011-11-01 DIAGNOSIS — M1990 Unspecified osteoarthritis, unspecified site: Secondary | ICD-10-CM | POA: Diagnosis not present

## 2011-11-01 DIAGNOSIS — R609 Edema, unspecified: Secondary | ICD-10-CM | POA: Diagnosis not present

## 2011-11-01 DIAGNOSIS — E559 Vitamin D deficiency, unspecified: Secondary | ICD-10-CM | POA: Diagnosis not present

## 2011-11-01 DIAGNOSIS — F0280 Dementia in other diseases classified elsewhere without behavioral disturbance: Secondary | ICD-10-CM | POA: Diagnosis not present

## 2011-11-01 DIAGNOSIS — I10 Essential (primary) hypertension: Secondary | ICD-10-CM | POA: Diagnosis not present

## 2011-11-01 DIAGNOSIS — R062 Wheezing: Secondary | ICD-10-CM | POA: Diagnosis not present

## 2011-11-01 DIAGNOSIS — E039 Hypothyroidism, unspecified: Secondary | ICD-10-CM | POA: Diagnosis not present

## 2011-11-01 DIAGNOSIS — N390 Urinary tract infection, site not specified: Secondary | ICD-10-CM | POA: Diagnosis not present

## 2011-11-01 DIAGNOSIS — J45909 Unspecified asthma, uncomplicated: Secondary | ICD-10-CM | POA: Diagnosis not present

## 2011-11-01 DIAGNOSIS — R0602 Shortness of breath: Secondary | ICD-10-CM | POA: Diagnosis not present

## 2011-11-07 DIAGNOSIS — J449 Chronic obstructive pulmonary disease, unspecified: Secondary | ICD-10-CM | POA: Diagnosis not present

## 2011-11-07 DIAGNOSIS — R609 Edema, unspecified: Secondary | ICD-10-CM | POA: Diagnosis not present

## 2011-11-07 DIAGNOSIS — N390 Urinary tract infection, site not specified: Secondary | ICD-10-CM | POA: Diagnosis not present

## 2011-11-07 DIAGNOSIS — F0280 Dementia in other diseases classified elsewhere without behavioral disturbance: Secondary | ICD-10-CM | POA: Diagnosis not present

## 2011-11-07 DIAGNOSIS — M1990 Unspecified osteoarthritis, unspecified site: Secondary | ICD-10-CM | POA: Diagnosis not present

## 2011-11-08 DIAGNOSIS — F0280 Dementia in other diseases classified elsewhere without behavioral disturbance: Secondary | ICD-10-CM | POA: Diagnosis not present

## 2011-11-08 DIAGNOSIS — M1990 Unspecified osteoarthritis, unspecified site: Secondary | ICD-10-CM | POA: Diagnosis not present

## 2011-11-08 DIAGNOSIS — R609 Edema, unspecified: Secondary | ICD-10-CM | POA: Diagnosis not present

## 2011-11-08 DIAGNOSIS — N390 Urinary tract infection, site not specified: Secondary | ICD-10-CM | POA: Diagnosis not present

## 2011-11-08 DIAGNOSIS — J449 Chronic obstructive pulmonary disease, unspecified: Secondary | ICD-10-CM | POA: Diagnosis not present

## 2011-11-11 DIAGNOSIS — Z8673 Personal history of transient ischemic attack (TIA), and cerebral infarction without residual deficits: Secondary | ICD-10-CM | POA: Diagnosis not present

## 2011-11-11 DIAGNOSIS — M810 Age-related osteoporosis without current pathological fracture: Secondary | ICD-10-CM | POA: Diagnosis not present

## 2011-11-11 DIAGNOSIS — J440 Chronic obstructive pulmonary disease with acute lower respiratory infection: Secondary | ICD-10-CM | POA: Diagnosis not present

## 2011-11-11 DIAGNOSIS — R262 Difficulty in walking, not elsewhere classified: Secondary | ICD-10-CM | POA: Diagnosis not present

## 2011-11-11 DIAGNOSIS — Z87440 Personal history of urinary (tract) infections: Secondary | ICD-10-CM | POA: Diagnosis not present

## 2011-11-11 DIAGNOSIS — I10 Essential (primary) hypertension: Secondary | ICD-10-CM | POA: Diagnosis not present

## 2011-11-11 DIAGNOSIS — F411 Generalized anxiety disorder: Secondary | ICD-10-CM | POA: Diagnosis not present

## 2011-11-11 DIAGNOSIS — M1990 Unspecified osteoarthritis, unspecified site: Secondary | ICD-10-CM | POA: Diagnosis not present

## 2011-11-11 DIAGNOSIS — IMO0001 Reserved for inherently not codable concepts without codable children: Secondary | ICD-10-CM | POA: Diagnosis not present

## 2011-11-11 DIAGNOSIS — R279 Unspecified lack of coordination: Secondary | ICD-10-CM | POA: Diagnosis not present

## 2011-11-11 DIAGNOSIS — Z9181 History of falling: Secondary | ICD-10-CM | POA: Diagnosis not present

## 2011-11-11 DIAGNOSIS — F0280 Dementia in other diseases classified elsewhere without behavioral disturbance: Secondary | ICD-10-CM | POA: Diagnosis not present

## 2011-11-17 DIAGNOSIS — R279 Unspecified lack of coordination: Secondary | ICD-10-CM | POA: Diagnosis not present

## 2011-11-17 DIAGNOSIS — IMO0001 Reserved for inherently not codable concepts without codable children: Secondary | ICD-10-CM | POA: Diagnosis not present

## 2011-11-17 DIAGNOSIS — R262 Difficulty in walking, not elsewhere classified: Secondary | ICD-10-CM | POA: Diagnosis not present

## 2011-11-17 DIAGNOSIS — J440 Chronic obstructive pulmonary disease with acute lower respiratory infection: Secondary | ICD-10-CM | POA: Diagnosis not present

## 2011-11-17 DIAGNOSIS — F0280 Dementia in other diseases classified elsewhere without behavioral disturbance: Secondary | ICD-10-CM | POA: Diagnosis not present

## 2011-11-20 DIAGNOSIS — F0280 Dementia in other diseases classified elsewhere without behavioral disturbance: Secondary | ICD-10-CM | POA: Diagnosis not present

## 2011-11-20 DIAGNOSIS — G309 Alzheimer's disease, unspecified: Secondary | ICD-10-CM | POA: Diagnosis not present

## 2011-11-20 DIAGNOSIS — IMO0001 Reserved for inherently not codable concepts without codable children: Secondary | ICD-10-CM | POA: Diagnosis not present

## 2011-11-20 DIAGNOSIS — J440 Chronic obstructive pulmonary disease with acute lower respiratory infection: Secondary | ICD-10-CM | POA: Diagnosis not present

## 2011-11-20 DIAGNOSIS — R262 Difficulty in walking, not elsewhere classified: Secondary | ICD-10-CM | POA: Diagnosis not present

## 2011-11-20 DIAGNOSIS — R279 Unspecified lack of coordination: Secondary | ICD-10-CM | POA: Diagnosis not present

## 2011-11-23 DIAGNOSIS — R279 Unspecified lack of coordination: Secondary | ICD-10-CM | POA: Diagnosis not present

## 2011-11-23 DIAGNOSIS — J440 Chronic obstructive pulmonary disease with acute lower respiratory infection: Secondary | ICD-10-CM | POA: Diagnosis not present

## 2011-11-23 DIAGNOSIS — F0280 Dementia in other diseases classified elsewhere without behavioral disturbance: Secondary | ICD-10-CM | POA: Diagnosis not present

## 2011-11-23 DIAGNOSIS — W010XXA Fall on same level from slipping, tripping and stumbling without subsequent striking against object, initial encounter: Secondary | ICD-10-CM | POA: Diagnosis not present

## 2011-11-23 DIAGNOSIS — IMO0001 Reserved for inherently not codable concepts without codable children: Secondary | ICD-10-CM | POA: Diagnosis not present

## 2011-11-23 DIAGNOSIS — R262 Difficulty in walking, not elsewhere classified: Secondary | ICD-10-CM | POA: Diagnosis not present

## 2011-11-23 DIAGNOSIS — IMO0002 Reserved for concepts with insufficient information to code with codable children: Secondary | ICD-10-CM | POA: Diagnosis not present

## 2011-11-28 DIAGNOSIS — J440 Chronic obstructive pulmonary disease with acute lower respiratory infection: Secondary | ICD-10-CM | POA: Diagnosis not present

## 2011-11-28 DIAGNOSIS — IMO0001 Reserved for inherently not codable concepts without codable children: Secondary | ICD-10-CM | POA: Diagnosis not present

## 2011-11-28 DIAGNOSIS — R279 Unspecified lack of coordination: Secondary | ICD-10-CM | POA: Diagnosis not present

## 2011-11-28 DIAGNOSIS — F0280 Dementia in other diseases classified elsewhere without behavioral disturbance: Secondary | ICD-10-CM | POA: Diagnosis not present

## 2011-11-28 DIAGNOSIS — R262 Difficulty in walking, not elsewhere classified: Secondary | ICD-10-CM | POA: Diagnosis not present

## 2011-11-29 DIAGNOSIS — IMO0001 Reserved for inherently not codable concepts without codable children: Secondary | ICD-10-CM | POA: Diagnosis not present

## 2011-11-29 DIAGNOSIS — R279 Unspecified lack of coordination: Secondary | ICD-10-CM | POA: Diagnosis not present

## 2011-11-29 DIAGNOSIS — R262 Difficulty in walking, not elsewhere classified: Secondary | ICD-10-CM | POA: Diagnosis not present

## 2011-11-29 DIAGNOSIS — F0280 Dementia in other diseases classified elsewhere without behavioral disturbance: Secondary | ICD-10-CM | POA: Diagnosis not present

## 2011-11-29 DIAGNOSIS — G309 Alzheimer's disease, unspecified: Secondary | ICD-10-CM | POA: Diagnosis not present

## 2011-11-29 DIAGNOSIS — J440 Chronic obstructive pulmonary disease with acute lower respiratory infection: Secondary | ICD-10-CM | POA: Diagnosis not present

## 2011-11-30 DIAGNOSIS — W19XXXA Unspecified fall, initial encounter: Secondary | ICD-10-CM | POA: Diagnosis not present

## 2011-11-30 DIAGNOSIS — J309 Allergic rhinitis, unspecified: Secondary | ICD-10-CM | POA: Diagnosis not present

## 2011-11-30 DIAGNOSIS — IMO0002 Reserved for concepts with insufficient information to code with codable children: Secondary | ICD-10-CM | POA: Diagnosis not present

## 2011-12-04 DIAGNOSIS — R262 Difficulty in walking, not elsewhere classified: Secondary | ICD-10-CM | POA: Diagnosis not present

## 2011-12-04 DIAGNOSIS — F0280 Dementia in other diseases classified elsewhere without behavioral disturbance: Secondary | ICD-10-CM | POA: Diagnosis not present

## 2011-12-04 DIAGNOSIS — R279 Unspecified lack of coordination: Secondary | ICD-10-CM | POA: Diagnosis not present

## 2011-12-04 DIAGNOSIS — IMO0001 Reserved for inherently not codable concepts without codable children: Secondary | ICD-10-CM | POA: Diagnosis not present

## 2011-12-04 DIAGNOSIS — J440 Chronic obstructive pulmonary disease with acute lower respiratory infection: Secondary | ICD-10-CM | POA: Diagnosis not present

## 2011-12-06 DIAGNOSIS — J440 Chronic obstructive pulmonary disease with acute lower respiratory infection: Secondary | ICD-10-CM | POA: Diagnosis not present

## 2011-12-06 DIAGNOSIS — R262 Difficulty in walking, not elsewhere classified: Secondary | ICD-10-CM | POA: Diagnosis not present

## 2011-12-06 DIAGNOSIS — R279 Unspecified lack of coordination: Secondary | ICD-10-CM | POA: Diagnosis not present

## 2011-12-06 DIAGNOSIS — IMO0001 Reserved for inherently not codable concepts without codable children: Secondary | ICD-10-CM | POA: Diagnosis not present

## 2011-12-06 DIAGNOSIS — G309 Alzheimer's disease, unspecified: Secondary | ICD-10-CM | POA: Diagnosis not present

## 2011-12-06 DIAGNOSIS — F0280 Dementia in other diseases classified elsewhere without behavioral disturbance: Secondary | ICD-10-CM | POA: Diagnosis not present

## 2011-12-12 DIAGNOSIS — F0280 Dementia in other diseases classified elsewhere without behavioral disturbance: Secondary | ICD-10-CM | POA: Diagnosis not present

## 2011-12-12 DIAGNOSIS — R262 Difficulty in walking, not elsewhere classified: Secondary | ICD-10-CM | POA: Diagnosis not present

## 2011-12-12 DIAGNOSIS — IMO0001 Reserved for inherently not codable concepts without codable children: Secondary | ICD-10-CM | POA: Diagnosis not present

## 2011-12-12 DIAGNOSIS — R279 Unspecified lack of coordination: Secondary | ICD-10-CM | POA: Diagnosis not present

## 2011-12-12 DIAGNOSIS — IMO0002 Reserved for concepts with insufficient information to code with codable children: Secondary | ICD-10-CM | POA: Diagnosis not present

## 2011-12-12 DIAGNOSIS — J440 Chronic obstructive pulmonary disease with acute lower respiratory infection: Secondary | ICD-10-CM | POA: Diagnosis not present

## 2011-12-14 DIAGNOSIS — R279 Unspecified lack of coordination: Secondary | ICD-10-CM | POA: Diagnosis not present

## 2011-12-14 DIAGNOSIS — F0280 Dementia in other diseases classified elsewhere without behavioral disturbance: Secondary | ICD-10-CM | POA: Diagnosis not present

## 2011-12-14 DIAGNOSIS — R262 Difficulty in walking, not elsewhere classified: Secondary | ICD-10-CM | POA: Diagnosis not present

## 2011-12-14 DIAGNOSIS — J440 Chronic obstructive pulmonary disease with acute lower respiratory infection: Secondary | ICD-10-CM | POA: Diagnosis not present

## 2011-12-14 DIAGNOSIS — IMO0001 Reserved for inherently not codable concepts without codable children: Secondary | ICD-10-CM | POA: Diagnosis not present

## 2011-12-19 DIAGNOSIS — E559 Vitamin D deficiency, unspecified: Secondary | ICD-10-CM | POA: Diagnosis not present

## 2011-12-19 DIAGNOSIS — F0280 Dementia in other diseases classified elsewhere without behavioral disturbance: Secondary | ICD-10-CM | POA: Diagnosis not present

## 2011-12-19 DIAGNOSIS — R279 Unspecified lack of coordination: Secondary | ICD-10-CM | POA: Diagnosis not present

## 2011-12-19 DIAGNOSIS — J440 Chronic obstructive pulmonary disease with acute lower respiratory infection: Secondary | ICD-10-CM | POA: Diagnosis not present

## 2011-12-19 DIAGNOSIS — R609 Edema, unspecified: Secondary | ICD-10-CM | POA: Diagnosis not present

## 2011-12-19 DIAGNOSIS — I10 Essential (primary) hypertension: Secondary | ICD-10-CM | POA: Diagnosis not present

## 2011-12-19 DIAGNOSIS — R262 Difficulty in walking, not elsewhere classified: Secondary | ICD-10-CM | POA: Diagnosis not present

## 2011-12-19 DIAGNOSIS — IMO0001 Reserved for inherently not codable concepts without codable children: Secondary | ICD-10-CM | POA: Diagnosis not present

## 2011-12-22 DIAGNOSIS — R262 Difficulty in walking, not elsewhere classified: Secondary | ICD-10-CM | POA: Diagnosis not present

## 2011-12-22 DIAGNOSIS — G309 Alzheimer's disease, unspecified: Secondary | ICD-10-CM | POA: Diagnosis not present

## 2011-12-22 DIAGNOSIS — R279 Unspecified lack of coordination: Secondary | ICD-10-CM | POA: Diagnosis not present

## 2011-12-22 DIAGNOSIS — F0280 Dementia in other diseases classified elsewhere without behavioral disturbance: Secondary | ICD-10-CM | POA: Diagnosis not present

## 2011-12-22 DIAGNOSIS — IMO0001 Reserved for inherently not codable concepts without codable children: Secondary | ICD-10-CM | POA: Diagnosis not present

## 2011-12-22 DIAGNOSIS — J440 Chronic obstructive pulmonary disease with acute lower respiratory infection: Secondary | ICD-10-CM | POA: Diagnosis not present

## 2011-12-27 DIAGNOSIS — R262 Difficulty in walking, not elsewhere classified: Secondary | ICD-10-CM | POA: Diagnosis not present

## 2011-12-27 DIAGNOSIS — J440 Chronic obstructive pulmonary disease with acute lower respiratory infection: Secondary | ICD-10-CM | POA: Diagnosis not present

## 2011-12-27 DIAGNOSIS — R279 Unspecified lack of coordination: Secondary | ICD-10-CM | POA: Diagnosis not present

## 2011-12-27 DIAGNOSIS — F0280 Dementia in other diseases classified elsewhere without behavioral disturbance: Secondary | ICD-10-CM | POA: Diagnosis not present

## 2011-12-27 DIAGNOSIS — IMO0001 Reserved for inherently not codable concepts without codable children: Secondary | ICD-10-CM | POA: Diagnosis not present

## 2011-12-28 DIAGNOSIS — J440 Chronic obstructive pulmonary disease with acute lower respiratory infection: Secondary | ICD-10-CM | POA: Diagnosis not present

## 2011-12-28 DIAGNOSIS — G309 Alzheimer's disease, unspecified: Secondary | ICD-10-CM | POA: Diagnosis not present

## 2011-12-28 DIAGNOSIS — R262 Difficulty in walking, not elsewhere classified: Secondary | ICD-10-CM | POA: Diagnosis not present

## 2011-12-28 DIAGNOSIS — R279 Unspecified lack of coordination: Secondary | ICD-10-CM | POA: Diagnosis not present

## 2011-12-28 DIAGNOSIS — IMO0001 Reserved for inherently not codable concepts without codable children: Secondary | ICD-10-CM | POA: Diagnosis not present

## 2011-12-28 DIAGNOSIS — F0280 Dementia in other diseases classified elsewhere without behavioral disturbance: Secondary | ICD-10-CM | POA: Diagnosis not present

## 2012-01-05 DIAGNOSIS — J449 Chronic obstructive pulmonary disease, unspecified: Secondary | ICD-10-CM | POA: Diagnosis not present

## 2012-01-09 DIAGNOSIS — M159 Polyosteoarthritis, unspecified: Secondary | ICD-10-CM | POA: Diagnosis not present

## 2012-01-09 DIAGNOSIS — I10 Essential (primary) hypertension: Secondary | ICD-10-CM | POA: Diagnosis not present

## 2012-01-09 DIAGNOSIS — H8110 Benign paroxysmal vertigo, unspecified ear: Secondary | ICD-10-CM | POA: Diagnosis not present

## 2012-01-09 DIAGNOSIS — J45909 Unspecified asthma, uncomplicated: Secondary | ICD-10-CM | POA: Diagnosis not present

## 2012-01-09 DIAGNOSIS — N390 Urinary tract infection, site not specified: Secondary | ICD-10-CM | POA: Diagnosis not present

## 2012-01-09 DIAGNOSIS — R5381 Other malaise: Secondary | ICD-10-CM | POA: Diagnosis not present

## 2012-01-22 DIAGNOSIS — R609 Edema, unspecified: Secondary | ICD-10-CM | POA: Diagnosis not present

## 2012-01-22 DIAGNOSIS — R0602 Shortness of breath: Secondary | ICD-10-CM | POA: Diagnosis not present

## 2012-01-22 DIAGNOSIS — N3946 Mixed incontinence: Secondary | ICD-10-CM | POA: Diagnosis not present

## 2012-01-22 DIAGNOSIS — J45909 Unspecified asthma, uncomplicated: Secondary | ICD-10-CM | POA: Diagnosis not present

## 2012-02-01 DIAGNOSIS — M79609 Pain in unspecified limb: Secondary | ICD-10-CM | POA: Diagnosis not present

## 2012-02-01 DIAGNOSIS — B351 Tinea unguium: Secondary | ICD-10-CM | POA: Diagnosis not present

## 2012-02-02 DIAGNOSIS — R0602 Shortness of breath: Secondary | ICD-10-CM | POA: Diagnosis not present

## 2012-02-14 DIAGNOSIS — J449 Chronic obstructive pulmonary disease, unspecified: Secondary | ICD-10-CM | POA: Diagnosis not present

## 2012-02-15 DIAGNOSIS — N390 Urinary tract infection, site not specified: Secondary | ICD-10-CM | POA: Diagnosis not present

## 2012-02-15 DIAGNOSIS — D485 Neoplasm of uncertain behavior of skin: Secondary | ICD-10-CM | POA: Diagnosis not present

## 2012-02-15 DIAGNOSIS — F411 Generalized anxiety disorder: Secondary | ICD-10-CM | POA: Diagnosis not present

## 2012-02-15 DIAGNOSIS — R300 Dysuria: Secondary | ICD-10-CM | POA: Diagnosis not present

## 2012-03-07 DIAGNOSIS — N390 Urinary tract infection, site not specified: Secondary | ICD-10-CM | POA: Diagnosis not present

## 2012-03-28 DIAGNOSIS — R609 Edema, unspecified: Secondary | ICD-10-CM | POA: Diagnosis not present

## 2012-03-28 DIAGNOSIS — J45909 Unspecified asthma, uncomplicated: Secondary | ICD-10-CM | POA: Diagnosis not present

## 2012-03-28 DIAGNOSIS — I288 Other diseases of pulmonary vessels: Secondary | ICD-10-CM | POA: Diagnosis not present

## 2012-04-05 DIAGNOSIS — B351 Tinea unguium: Secondary | ICD-10-CM | POA: Diagnosis not present

## 2012-04-05 DIAGNOSIS — M79609 Pain in unspecified limb: Secondary | ICD-10-CM | POA: Diagnosis not present

## 2012-04-10 DIAGNOSIS — R300 Dysuria: Secondary | ICD-10-CM | POA: Diagnosis not present

## 2012-04-12 DIAGNOSIS — F329 Major depressive disorder, single episode, unspecified: Secondary | ICD-10-CM | POA: Diagnosis not present

## 2012-04-12 DIAGNOSIS — F0281 Dementia in other diseases classified elsewhere with behavioral disturbance: Secondary | ICD-10-CM | POA: Diagnosis not present

## 2012-04-12 DIAGNOSIS — E039 Hypothyroidism, unspecified: Secondary | ICD-10-CM | POA: Diagnosis not present

## 2012-04-12 DIAGNOSIS — R5381 Other malaise: Secondary | ICD-10-CM | POA: Diagnosis not present

## 2012-04-12 DIAGNOSIS — G4700 Insomnia, unspecified: Secondary | ICD-10-CM | POA: Diagnosis not present

## 2012-04-12 DIAGNOSIS — R443 Hallucinations, unspecified: Secondary | ICD-10-CM | POA: Diagnosis not present

## 2012-04-29 DIAGNOSIS — R0602 Shortness of breath: Secondary | ICD-10-CM | POA: Diagnosis not present

## 2012-04-29 DIAGNOSIS — R413 Other amnesia: Secondary | ICD-10-CM | POA: Diagnosis not present

## 2012-04-29 DIAGNOSIS — G4700 Insomnia, unspecified: Secondary | ICD-10-CM | POA: Diagnosis not present

## 2012-04-29 DIAGNOSIS — N189 Chronic kidney disease, unspecified: Secondary | ICD-10-CM | POA: Diagnosis not present

## 2012-04-29 DIAGNOSIS — F0281 Dementia in other diseases classified elsewhere with behavioral disturbance: Secondary | ICD-10-CM | POA: Diagnosis not present

## 2012-04-29 DIAGNOSIS — G4710 Hypersomnia, unspecified: Secondary | ICD-10-CM | POA: Diagnosis not present

## 2012-04-29 DIAGNOSIS — R262 Difficulty in walking, not elsewhere classified: Secondary | ICD-10-CM | POA: Diagnosis not present

## 2012-04-29 DIAGNOSIS — R443 Hallucinations, unspecified: Secondary | ICD-10-CM | POA: Diagnosis not present

## 2012-04-29 DIAGNOSIS — D649 Anemia, unspecified: Secondary | ICD-10-CM | POA: Diagnosis not present

## 2012-04-30 DIAGNOSIS — F0281 Dementia in other diseases classified elsewhere with behavioral disturbance: Secondary | ICD-10-CM | POA: Diagnosis not present

## 2012-04-30 DIAGNOSIS — R350 Frequency of micturition: Secondary | ICD-10-CM | POA: Diagnosis not present

## 2012-05-02 DIAGNOSIS — Z23 Encounter for immunization: Secondary | ICD-10-CM | POA: Diagnosis not present

## 2012-05-07 DIAGNOSIS — E559 Vitamin D deficiency, unspecified: Secondary | ICD-10-CM | POA: Diagnosis not present

## 2012-05-07 DIAGNOSIS — D631 Anemia in chronic kidney disease: Secondary | ICD-10-CM | POA: Diagnosis not present

## 2012-05-07 DIAGNOSIS — N039 Chronic nephritic syndrome with unspecified morphologic changes: Secondary | ICD-10-CM | POA: Diagnosis not present

## 2012-05-07 DIAGNOSIS — R609 Edema, unspecified: Secondary | ICD-10-CM | POA: Diagnosis not present

## 2012-05-07 DIAGNOSIS — I10 Essential (primary) hypertension: Secondary | ICD-10-CM | POA: Diagnosis not present

## 2012-05-09 DIAGNOSIS — H353190 Nonexudative age-related macular degeneration, unspecified eye, stage unspecified: Secondary | ICD-10-CM | POA: Diagnosis not present

## 2012-05-14 DIAGNOSIS — R413 Other amnesia: Secondary | ICD-10-CM | POA: Diagnosis not present

## 2012-05-14 DIAGNOSIS — E039 Hypothyroidism, unspecified: Secondary | ICD-10-CM | POA: Diagnosis not present

## 2012-05-14 DIAGNOSIS — R443 Hallucinations, unspecified: Secondary | ICD-10-CM | POA: Diagnosis not present

## 2012-05-14 DIAGNOSIS — M159 Polyosteoarthritis, unspecified: Secondary | ICD-10-CM | POA: Diagnosis not present

## 2012-05-14 DIAGNOSIS — F329 Major depressive disorder, single episode, unspecified: Secondary | ICD-10-CM | POA: Diagnosis not present

## 2012-05-14 DIAGNOSIS — F068 Other specified mental disorders due to known physiological condition: Secondary | ICD-10-CM | POA: Diagnosis not present

## 2012-05-14 DIAGNOSIS — I10 Essential (primary) hypertension: Secondary | ICD-10-CM | POA: Diagnosis not present

## 2012-05-14 DIAGNOSIS — G4700 Insomnia, unspecified: Secondary | ICD-10-CM | POA: Diagnosis not present

## 2012-06-07 DIAGNOSIS — M79609 Pain in unspecified limb: Secondary | ICD-10-CM | POA: Diagnosis not present

## 2012-06-07 DIAGNOSIS — B351 Tinea unguium: Secondary | ICD-10-CM | POA: Diagnosis not present

## 2012-06-20 DIAGNOSIS — F329 Major depressive disorder, single episode, unspecified: Secondary | ICD-10-CM | POA: Diagnosis not present

## 2012-06-20 DIAGNOSIS — F068 Other specified mental disorders due to known physiological condition: Secondary | ICD-10-CM | POA: Diagnosis not present

## 2012-06-25 DIAGNOSIS — N390 Urinary tract infection, site not specified: Secondary | ICD-10-CM | POA: Diagnosis not present

## 2012-06-25 DIAGNOSIS — D509 Iron deficiency anemia, unspecified: Secondary | ICD-10-CM | POA: Diagnosis not present

## 2012-06-25 DIAGNOSIS — D518 Other vitamin B12 deficiency anemias: Secondary | ICD-10-CM | POA: Diagnosis not present

## 2012-06-25 DIAGNOSIS — E039 Hypothyroidism, unspecified: Secondary | ICD-10-CM | POA: Diagnosis not present

## 2012-06-25 DIAGNOSIS — R300 Dysuria: Secondary | ICD-10-CM | POA: Diagnosis not present

## 2012-06-25 DIAGNOSIS — F0280 Dementia in other diseases classified elsewhere without behavioral disturbance: Secondary | ICD-10-CM | POA: Diagnosis not present

## 2012-07-17 DIAGNOSIS — F068 Other specified mental disorders due to known physiological condition: Secondary | ICD-10-CM | POA: Diagnosis not present

## 2012-07-17 DIAGNOSIS — F329 Major depressive disorder, single episode, unspecified: Secondary | ICD-10-CM | POA: Diagnosis not present

## 2012-07-25 DIAGNOSIS — L03317 Cellulitis of buttock: Secondary | ICD-10-CM | POA: Diagnosis not present

## 2012-07-25 DIAGNOSIS — R262 Difficulty in walking, not elsewhere classified: Secondary | ICD-10-CM | POA: Diagnosis not present

## 2012-07-29 DIAGNOSIS — J449 Chronic obstructive pulmonary disease, unspecified: Secondary | ICD-10-CM | POA: Diagnosis not present

## 2012-07-29 DIAGNOSIS — I10 Essential (primary) hypertension: Secondary | ICD-10-CM | POA: Diagnosis not present

## 2012-07-29 DIAGNOSIS — G309 Alzheimer's disease, unspecified: Secondary | ICD-10-CM | POA: Diagnosis not present

## 2012-07-29 DIAGNOSIS — R609 Edema, unspecified: Secondary | ICD-10-CM | POA: Diagnosis not present

## 2012-07-29 DIAGNOSIS — M1990 Unspecified osteoarthritis, unspecified site: Secondary | ICD-10-CM | POA: Diagnosis not present

## 2012-07-29 DIAGNOSIS — F0280 Dementia in other diseases classified elsewhere without behavioral disturbance: Secondary | ICD-10-CM | POA: Diagnosis not present

## 2012-07-29 DIAGNOSIS — F411 Generalized anxiety disorder: Secondary | ICD-10-CM | POA: Diagnosis not present

## 2012-07-29 DIAGNOSIS — L0231 Cutaneous abscess of buttock: Secondary | ICD-10-CM | POA: Diagnosis not present

## 2012-07-29 DIAGNOSIS — Z9181 History of falling: Secondary | ICD-10-CM | POA: Diagnosis not present

## 2012-07-29 DIAGNOSIS — Z87440 Personal history of urinary (tract) infections: Secondary | ICD-10-CM | POA: Diagnosis not present

## 2012-07-29 DIAGNOSIS — Z9981 Dependence on supplemental oxygen: Secondary | ICD-10-CM | POA: Diagnosis not present

## 2012-07-29 DIAGNOSIS — L03317 Cellulitis of buttock: Secondary | ICD-10-CM | POA: Diagnosis not present

## 2012-08-02 DIAGNOSIS — J449 Chronic obstructive pulmonary disease, unspecified: Secondary | ICD-10-CM | POA: Diagnosis not present

## 2012-08-02 DIAGNOSIS — I10 Essential (primary) hypertension: Secondary | ICD-10-CM | POA: Diagnosis not present

## 2012-08-02 DIAGNOSIS — L0231 Cutaneous abscess of buttock: Secondary | ICD-10-CM | POA: Diagnosis not present

## 2012-08-02 DIAGNOSIS — F0280 Dementia in other diseases classified elsewhere without behavioral disturbance: Secondary | ICD-10-CM | POA: Diagnosis not present

## 2012-08-02 DIAGNOSIS — M1990 Unspecified osteoarthritis, unspecified site: Secondary | ICD-10-CM | POA: Diagnosis not present

## 2012-08-05 DIAGNOSIS — M1990 Unspecified osteoarthritis, unspecified site: Secondary | ICD-10-CM | POA: Diagnosis not present

## 2012-08-05 DIAGNOSIS — F0280 Dementia in other diseases classified elsewhere without behavioral disturbance: Secondary | ICD-10-CM | POA: Diagnosis not present

## 2012-08-05 DIAGNOSIS — J449 Chronic obstructive pulmonary disease, unspecified: Secondary | ICD-10-CM | POA: Diagnosis not present

## 2012-08-05 DIAGNOSIS — I10 Essential (primary) hypertension: Secondary | ICD-10-CM | POA: Diagnosis not present

## 2012-08-05 DIAGNOSIS — L0231 Cutaneous abscess of buttock: Secondary | ICD-10-CM | POA: Diagnosis not present

## 2012-08-12 DIAGNOSIS — R609 Edema, unspecified: Secondary | ICD-10-CM | POA: Diagnosis not present

## 2012-08-12 DIAGNOSIS — J449 Chronic obstructive pulmonary disease, unspecified: Secondary | ICD-10-CM | POA: Diagnosis not present

## 2012-08-12 DIAGNOSIS — L0231 Cutaneous abscess of buttock: Secondary | ICD-10-CM | POA: Diagnosis not present

## 2012-08-12 DIAGNOSIS — G309 Alzheimer's disease, unspecified: Secondary | ICD-10-CM | POA: Diagnosis not present

## 2012-08-12 DIAGNOSIS — I10 Essential (primary) hypertension: Secondary | ICD-10-CM | POA: Diagnosis not present

## 2012-08-12 DIAGNOSIS — F411 Generalized anxiety disorder: Secondary | ICD-10-CM | POA: Diagnosis not present

## 2012-08-12 DIAGNOSIS — M1990 Unspecified osteoarthritis, unspecified site: Secondary | ICD-10-CM | POA: Diagnosis not present

## 2012-08-19 DIAGNOSIS — L0231 Cutaneous abscess of buttock: Secondary | ICD-10-CM | POA: Diagnosis not present

## 2012-08-19 DIAGNOSIS — J449 Chronic obstructive pulmonary disease, unspecified: Secondary | ICD-10-CM | POA: Diagnosis not present

## 2012-08-22 DIAGNOSIS — M79609 Pain in unspecified limb: Secondary | ICD-10-CM | POA: Diagnosis not present

## 2012-08-22 DIAGNOSIS — B351 Tinea unguium: Secondary | ICD-10-CM | POA: Diagnosis not present

## 2012-08-26 DIAGNOSIS — I10 Essential (primary) hypertension: Secondary | ICD-10-CM | POA: Diagnosis not present

## 2012-08-27 DIAGNOSIS — R609 Edema, unspecified: Secondary | ICD-10-CM | POA: Diagnosis not present

## 2012-08-27 DIAGNOSIS — I10 Essential (primary) hypertension: Secondary | ICD-10-CM | POA: Diagnosis not present

## 2012-08-27 DIAGNOSIS — N039 Chronic nephritic syndrome with unspecified morphologic changes: Secondary | ICD-10-CM | POA: Diagnosis not present

## 2012-08-27 DIAGNOSIS — N183 Chronic kidney disease, stage 3 unspecified: Secondary | ICD-10-CM | POA: Diagnosis not present

## 2012-08-29 DIAGNOSIS — L570 Actinic keratosis: Secondary | ICD-10-CM | POA: Diagnosis not present

## 2012-08-29 DIAGNOSIS — L821 Other seborrheic keratosis: Secondary | ICD-10-CM | POA: Diagnosis not present

## 2012-08-29 DIAGNOSIS — L408 Other psoriasis: Secondary | ICD-10-CM | POA: Diagnosis not present

## 2012-08-29 DIAGNOSIS — L578 Other skin changes due to chronic exposure to nonionizing radiation: Secondary | ICD-10-CM | POA: Diagnosis not present

## 2012-09-12 DIAGNOSIS — F329 Major depressive disorder, single episode, unspecified: Secondary | ICD-10-CM | POA: Diagnosis not present

## 2012-09-12 DIAGNOSIS — R300 Dysuria: Secondary | ICD-10-CM | POA: Diagnosis not present

## 2012-09-12 DIAGNOSIS — I10 Essential (primary) hypertension: Secondary | ICD-10-CM | POA: Diagnosis not present

## 2012-09-12 DIAGNOSIS — N390 Urinary tract infection, site not specified: Secondary | ICD-10-CM | POA: Diagnosis not present

## 2012-09-12 DIAGNOSIS — F0280 Dementia in other diseases classified elsewhere without behavioral disturbance: Secondary | ICD-10-CM | POA: Diagnosis not present

## 2012-09-12 DIAGNOSIS — N3946 Mixed incontinence: Secondary | ICD-10-CM | POA: Diagnosis not present

## 2012-09-12 DIAGNOSIS — R413 Other amnesia: Secondary | ICD-10-CM | POA: Diagnosis not present

## 2012-09-12 DIAGNOSIS — N189 Chronic kidney disease, unspecified: Secondary | ICD-10-CM | POA: Diagnosis not present

## 2012-09-12 DIAGNOSIS — I288 Other diseases of pulmonary vessels: Secondary | ICD-10-CM | POA: Diagnosis not present

## 2012-09-12 DIAGNOSIS — E039 Hypothyroidism, unspecified: Secondary | ICD-10-CM | POA: Diagnosis not present

## 2012-09-12 DIAGNOSIS — R262 Difficulty in walking, not elsewhere classified: Secondary | ICD-10-CM | POA: Diagnosis not present

## 2012-09-12 DIAGNOSIS — F068 Other specified mental disorders due to known physiological condition: Secondary | ICD-10-CM | POA: Diagnosis not present

## 2012-10-03 DIAGNOSIS — J45909 Unspecified asthma, uncomplicated: Secondary | ICD-10-CM | POA: Diagnosis not present

## 2012-10-03 DIAGNOSIS — I288 Other diseases of pulmonary vessels: Secondary | ICD-10-CM | POA: Diagnosis not present

## 2012-10-25 DIAGNOSIS — B351 Tinea unguium: Secondary | ICD-10-CM | POA: Diagnosis not present

## 2012-10-25 DIAGNOSIS — M79609 Pain in unspecified limb: Secondary | ICD-10-CM | POA: Diagnosis not present

## 2012-11-07 DIAGNOSIS — H353190 Nonexudative age-related macular degeneration, unspecified eye, stage unspecified: Secondary | ICD-10-CM | POA: Diagnosis not present

## 2012-11-14 DIAGNOSIS — E559 Vitamin D deficiency, unspecified: Secondary | ICD-10-CM | POA: Diagnosis not present

## 2012-11-14 DIAGNOSIS — I10 Essential (primary) hypertension: Secondary | ICD-10-CM | POA: Diagnosis not present

## 2012-11-14 DIAGNOSIS — N3946 Mixed incontinence: Secondary | ICD-10-CM | POA: Diagnosis not present

## 2012-11-14 DIAGNOSIS — N3020 Other chronic cystitis without hematuria: Secondary | ICD-10-CM | POA: Diagnosis not present

## 2012-11-14 DIAGNOSIS — D649 Anemia, unspecified: Secondary | ICD-10-CM | POA: Diagnosis not present

## 2012-11-14 DIAGNOSIS — E039 Hypothyroidism, unspecified: Secondary | ICD-10-CM | POA: Diagnosis not present

## 2012-11-14 DIAGNOSIS — Z0000 Encounter for general adult medical examination without abnormal findings: Secondary | ICD-10-CM | POA: Diagnosis not present

## 2012-11-14 DIAGNOSIS — F0280 Dementia in other diseases classified elsewhere without behavioral disturbance: Secondary | ICD-10-CM | POA: Diagnosis not present

## 2012-11-14 DIAGNOSIS — N189 Chronic kidney disease, unspecified: Secondary | ICD-10-CM | POA: Diagnosis not present

## 2012-11-14 DIAGNOSIS — I288 Other diseases of pulmonary vessels: Secondary | ICD-10-CM | POA: Diagnosis not present

## 2012-12-17 DIAGNOSIS — R609 Edema, unspecified: Secondary | ICD-10-CM | POA: Diagnosis not present

## 2012-12-17 DIAGNOSIS — N039 Chronic nephritic syndrome with unspecified morphologic changes: Secondary | ICD-10-CM | POA: Diagnosis not present

## 2012-12-17 DIAGNOSIS — I10 Essential (primary) hypertension: Secondary | ICD-10-CM | POA: Diagnosis not present

## 2012-12-17 DIAGNOSIS — D631 Anemia in chronic kidney disease: Secondary | ICD-10-CM | POA: Diagnosis not present

## 2013-01-08 DIAGNOSIS — M79609 Pain in unspecified limb: Secondary | ICD-10-CM | POA: Diagnosis not present

## 2013-01-08 DIAGNOSIS — B351 Tinea unguium: Secondary | ICD-10-CM | POA: Diagnosis not present

## 2013-02-07 DIAGNOSIS — R300 Dysuria: Secondary | ICD-10-CM | POA: Diagnosis not present

## 2013-02-07 DIAGNOSIS — N3946 Mixed incontinence: Secondary | ICD-10-CM | POA: Diagnosis not present

## 2013-02-07 DIAGNOSIS — N390 Urinary tract infection, site not specified: Secondary | ICD-10-CM | POA: Diagnosis not present

## 2013-02-11 DIAGNOSIS — H6120 Impacted cerumen, unspecified ear: Secondary | ICD-10-CM | POA: Diagnosis not present

## 2013-02-11 DIAGNOSIS — F0280 Dementia in other diseases classified elsewhere without behavioral disturbance: Secondary | ICD-10-CM | POA: Diagnosis not present

## 2013-02-11 DIAGNOSIS — N3020 Other chronic cystitis without hematuria: Secondary | ICD-10-CM | POA: Diagnosis not present

## 2013-02-11 DIAGNOSIS — D649 Anemia, unspecified: Secondary | ICD-10-CM | POA: Diagnosis not present

## 2013-02-11 DIAGNOSIS — N390 Urinary tract infection, site not specified: Secondary | ICD-10-CM | POA: Diagnosis not present

## 2013-02-11 DIAGNOSIS — E559 Vitamin D deficiency, unspecified: Secondary | ICD-10-CM | POA: Diagnosis not present

## 2013-02-11 DIAGNOSIS — Z006 Encounter for examination for normal comparison and control in clinical research program: Secondary | ICD-10-CM | POA: Diagnosis not present

## 2013-02-11 DIAGNOSIS — I10 Essential (primary) hypertension: Secondary | ICD-10-CM | POA: Diagnosis not present

## 2013-02-11 DIAGNOSIS — N3946 Mixed incontinence: Secondary | ICD-10-CM | POA: Diagnosis not present

## 2013-02-11 DIAGNOSIS — N189 Chronic kidney disease, unspecified: Secondary | ICD-10-CM | POA: Diagnosis not present

## 2013-02-11 DIAGNOSIS — F411 Generalized anxiety disorder: Secondary | ICD-10-CM | POA: Diagnosis not present

## 2013-03-07 DIAGNOSIS — N390 Urinary tract infection, site not specified: Secondary | ICD-10-CM | POA: Diagnosis not present

## 2013-03-20 DIAGNOSIS — L821 Other seborrheic keratosis: Secondary | ICD-10-CM | POA: Diagnosis not present

## 2013-03-20 DIAGNOSIS — L408 Other psoriasis: Secondary | ICD-10-CM | POA: Diagnosis not present

## 2013-03-20 DIAGNOSIS — L570 Actinic keratosis: Secondary | ICD-10-CM | POA: Diagnosis not present

## 2013-03-20 DIAGNOSIS — D239 Other benign neoplasm of skin, unspecified: Secondary | ICD-10-CM | POA: Diagnosis not present

## 2013-03-20 DIAGNOSIS — L578 Other skin changes due to chronic exposure to nonionizing radiation: Secondary | ICD-10-CM | POA: Diagnosis not present

## 2013-03-20 DIAGNOSIS — L820 Inflamed seborrheic keratosis: Secondary | ICD-10-CM | POA: Diagnosis not present

## 2013-03-20 DIAGNOSIS — D1800 Hemangioma unspecified site: Secondary | ICD-10-CM | POA: Diagnosis not present

## 2013-03-31 DIAGNOSIS — M79609 Pain in unspecified limb: Secondary | ICD-10-CM | POA: Diagnosis not present

## 2013-03-31 DIAGNOSIS — B351 Tinea unguium: Secondary | ICD-10-CM | POA: Diagnosis not present

## 2013-04-03 DIAGNOSIS — J309 Allergic rhinitis, unspecified: Secondary | ICD-10-CM | POA: Diagnosis not present

## 2013-04-03 DIAGNOSIS — I288 Other diseases of pulmonary vessels: Secondary | ICD-10-CM | POA: Diagnosis not present

## 2013-04-03 DIAGNOSIS — J449 Chronic obstructive pulmonary disease, unspecified: Secondary | ICD-10-CM | POA: Diagnosis not present

## 2013-04-07 DIAGNOSIS — I10 Essential (primary) hypertension: Secondary | ICD-10-CM | POA: Diagnosis not present

## 2013-04-07 DIAGNOSIS — D631 Anemia in chronic kidney disease: Secondary | ICD-10-CM | POA: Diagnosis not present

## 2013-04-07 DIAGNOSIS — R609 Edema, unspecified: Secondary | ICD-10-CM | POA: Diagnosis not present

## 2013-04-19 DIAGNOSIS — Z23 Encounter for immunization: Secondary | ICD-10-CM | POA: Diagnosis not present

## 2013-05-07 DIAGNOSIS — F329 Major depressive disorder, single episode, unspecified: Secondary | ICD-10-CM | POA: Diagnosis not present

## 2013-05-07 DIAGNOSIS — F068 Other specified mental disorders due to known physiological condition: Secondary | ICD-10-CM | POA: Diagnosis not present

## 2013-05-08 DIAGNOSIS — H40009 Preglaucoma, unspecified, unspecified eye: Secondary | ICD-10-CM | POA: Diagnosis not present

## 2013-05-11 DIAGNOSIS — F0280 Dementia in other diseases classified elsewhere without behavioral disturbance: Secondary | ICD-10-CM | POA: Diagnosis not present

## 2013-05-11 DIAGNOSIS — R0602 Shortness of breath: Secondary | ICD-10-CM | POA: Diagnosis not present

## 2013-05-11 DIAGNOSIS — J441 Chronic obstructive pulmonary disease with (acute) exacerbation: Secondary | ICD-10-CM | POA: Diagnosis not present

## 2013-05-11 DIAGNOSIS — R569 Unspecified convulsions: Secondary | ICD-10-CM | POA: Diagnosis not present

## 2013-05-11 DIAGNOSIS — F411 Generalized anxiety disorder: Secondary | ICD-10-CM | POA: Diagnosis not present

## 2013-05-11 DIAGNOSIS — Z9181 History of falling: Secondary | ICD-10-CM | POA: Diagnosis not present

## 2013-05-11 DIAGNOSIS — R609 Edema, unspecified: Secondary | ICD-10-CM | POA: Diagnosis not present

## 2013-05-11 DIAGNOSIS — M6281 Muscle weakness (generalized): Secondary | ICD-10-CM | POA: Diagnosis not present

## 2013-05-12 DIAGNOSIS — N189 Chronic kidney disease, unspecified: Secondary | ICD-10-CM | POA: Diagnosis not present

## 2013-05-12 DIAGNOSIS — E039 Hypothyroidism, unspecified: Secondary | ICD-10-CM | POA: Diagnosis not present

## 2013-05-12 DIAGNOSIS — J449 Chronic obstructive pulmonary disease, unspecified: Secondary | ICD-10-CM | POA: Diagnosis not present

## 2013-05-12 DIAGNOSIS — I10 Essential (primary) hypertension: Secondary | ICD-10-CM | POA: Diagnosis not present

## 2013-05-12 DIAGNOSIS — H6120 Impacted cerumen, unspecified ear: Secondary | ICD-10-CM | POA: Diagnosis not present

## 2013-05-12 DIAGNOSIS — J309 Allergic rhinitis, unspecified: Secondary | ICD-10-CM | POA: Diagnosis not present

## 2013-05-16 DIAGNOSIS — R0602 Shortness of breath: Secondary | ICD-10-CM | POA: Diagnosis not present

## 2013-05-16 DIAGNOSIS — R609 Edema, unspecified: Secondary | ICD-10-CM | POA: Diagnosis not present

## 2013-05-16 DIAGNOSIS — J441 Chronic obstructive pulmonary disease with (acute) exacerbation: Secondary | ICD-10-CM | POA: Diagnosis not present

## 2013-05-16 DIAGNOSIS — M6281 Muscle weakness (generalized): Secondary | ICD-10-CM | POA: Diagnosis not present

## 2013-05-16 DIAGNOSIS — F0280 Dementia in other diseases classified elsewhere without behavioral disturbance: Secondary | ICD-10-CM | POA: Diagnosis not present

## 2013-05-20 DIAGNOSIS — R0602 Shortness of breath: Secondary | ICD-10-CM | POA: Diagnosis not present

## 2013-05-20 DIAGNOSIS — F0280 Dementia in other diseases classified elsewhere without behavioral disturbance: Secondary | ICD-10-CM | POA: Diagnosis not present

## 2013-05-20 DIAGNOSIS — M6281 Muscle weakness (generalized): Secondary | ICD-10-CM | POA: Diagnosis not present

## 2013-05-20 DIAGNOSIS — R609 Edema, unspecified: Secondary | ICD-10-CM | POA: Diagnosis not present

## 2013-05-20 DIAGNOSIS — J441 Chronic obstructive pulmonary disease with (acute) exacerbation: Secondary | ICD-10-CM | POA: Diagnosis not present

## 2013-05-21 DIAGNOSIS — F0280 Dementia in other diseases classified elsewhere without behavioral disturbance: Secondary | ICD-10-CM | POA: Diagnosis not present

## 2013-05-21 DIAGNOSIS — R609 Edema, unspecified: Secondary | ICD-10-CM | POA: Diagnosis not present

## 2013-05-21 DIAGNOSIS — J441 Chronic obstructive pulmonary disease with (acute) exacerbation: Secondary | ICD-10-CM | POA: Diagnosis not present

## 2013-05-21 DIAGNOSIS — R0602 Shortness of breath: Secondary | ICD-10-CM | POA: Diagnosis not present

## 2013-05-21 DIAGNOSIS — M6281 Muscle weakness (generalized): Secondary | ICD-10-CM | POA: Diagnosis not present

## 2013-05-23 DIAGNOSIS — F0280 Dementia in other diseases classified elsewhere without behavioral disturbance: Secondary | ICD-10-CM | POA: Diagnosis not present

## 2013-05-23 DIAGNOSIS — J441 Chronic obstructive pulmonary disease with (acute) exacerbation: Secondary | ICD-10-CM | POA: Diagnosis not present

## 2013-05-23 DIAGNOSIS — M6281 Muscle weakness (generalized): Secondary | ICD-10-CM | POA: Diagnosis not present

## 2013-05-23 DIAGNOSIS — R609 Edema, unspecified: Secondary | ICD-10-CM | POA: Diagnosis not present

## 2013-05-23 DIAGNOSIS — R0602 Shortness of breath: Secondary | ICD-10-CM | POA: Diagnosis not present

## 2013-05-26 DIAGNOSIS — R0602 Shortness of breath: Secondary | ICD-10-CM | POA: Diagnosis not present

## 2013-05-26 DIAGNOSIS — R609 Edema, unspecified: Secondary | ICD-10-CM | POA: Diagnosis not present

## 2013-05-26 DIAGNOSIS — J441 Chronic obstructive pulmonary disease with (acute) exacerbation: Secondary | ICD-10-CM | POA: Diagnosis not present

## 2013-05-26 DIAGNOSIS — F0280 Dementia in other diseases classified elsewhere without behavioral disturbance: Secondary | ICD-10-CM | POA: Diagnosis not present

## 2013-05-26 DIAGNOSIS — M6281 Muscle weakness (generalized): Secondary | ICD-10-CM | POA: Diagnosis not present

## 2013-05-27 DIAGNOSIS — R0602 Shortness of breath: Secondary | ICD-10-CM | POA: Diagnosis not present

## 2013-05-27 DIAGNOSIS — F411 Generalized anxiety disorder: Secondary | ICD-10-CM | POA: Diagnosis not present

## 2013-05-27 DIAGNOSIS — J441 Chronic obstructive pulmonary disease with (acute) exacerbation: Secondary | ICD-10-CM | POA: Diagnosis not present

## 2013-05-27 DIAGNOSIS — R609 Edema, unspecified: Secondary | ICD-10-CM | POA: Diagnosis not present

## 2013-05-27 DIAGNOSIS — R569 Unspecified convulsions: Secondary | ICD-10-CM | POA: Diagnosis not present

## 2013-05-27 DIAGNOSIS — F0280 Dementia in other diseases classified elsewhere without behavioral disturbance: Secondary | ICD-10-CM | POA: Diagnosis not present

## 2013-05-27 DIAGNOSIS — M6281 Muscle weakness (generalized): Secondary | ICD-10-CM | POA: Diagnosis not present

## 2013-05-30 DIAGNOSIS — R300 Dysuria: Secondary | ICD-10-CM | POA: Diagnosis not present

## 2013-05-30 DIAGNOSIS — N390 Urinary tract infection, site not specified: Secondary | ICD-10-CM | POA: Diagnosis not present

## 2013-06-02 DIAGNOSIS — F0280 Dementia in other diseases classified elsewhere without behavioral disturbance: Secondary | ICD-10-CM | POA: Diagnosis not present

## 2013-06-02 DIAGNOSIS — M6281 Muscle weakness (generalized): Secondary | ICD-10-CM | POA: Diagnosis not present

## 2013-06-02 DIAGNOSIS — J441 Chronic obstructive pulmonary disease with (acute) exacerbation: Secondary | ICD-10-CM | POA: Diagnosis not present

## 2013-06-02 DIAGNOSIS — R0602 Shortness of breath: Secondary | ICD-10-CM | POA: Diagnosis not present

## 2013-06-02 DIAGNOSIS — R609 Edema, unspecified: Secondary | ICD-10-CM | POA: Diagnosis not present

## 2013-06-09 DIAGNOSIS — M6281 Muscle weakness (generalized): Secondary | ICD-10-CM | POA: Diagnosis not present

## 2013-06-09 DIAGNOSIS — J441 Chronic obstructive pulmonary disease with (acute) exacerbation: Secondary | ICD-10-CM | POA: Diagnosis not present

## 2013-06-09 DIAGNOSIS — F0280 Dementia in other diseases classified elsewhere without behavioral disturbance: Secondary | ICD-10-CM | POA: Diagnosis not present

## 2013-06-09 DIAGNOSIS — R0602 Shortness of breath: Secondary | ICD-10-CM | POA: Diagnosis not present

## 2013-06-09 DIAGNOSIS — R609 Edema, unspecified: Secondary | ICD-10-CM | POA: Diagnosis not present

## 2013-06-17 DIAGNOSIS — B351 Tinea unguium: Secondary | ICD-10-CM | POA: Diagnosis not present

## 2013-06-17 DIAGNOSIS — M79609 Pain in unspecified limb: Secondary | ICD-10-CM | POA: Diagnosis not present

## 2013-07-05 DIAGNOSIS — R05 Cough: Secondary | ICD-10-CM | POA: Diagnosis not present

## 2013-07-05 DIAGNOSIS — J00 Acute nasopharyngitis [common cold]: Secondary | ICD-10-CM | POA: Diagnosis not present

## 2013-07-05 DIAGNOSIS — R059 Cough, unspecified: Secondary | ICD-10-CM | POA: Diagnosis not present

## 2013-07-08 DIAGNOSIS — R05 Cough: Secondary | ICD-10-CM | POA: Diagnosis not present

## 2013-07-08 DIAGNOSIS — J209 Acute bronchitis, unspecified: Secondary | ICD-10-CM | POA: Diagnosis not present

## 2013-07-08 DIAGNOSIS — R059 Cough, unspecified: Secondary | ICD-10-CM | POA: Diagnosis not present

## 2013-07-18 DIAGNOSIS — M159 Polyosteoarthritis, unspecified: Secondary | ICD-10-CM | POA: Diagnosis not present

## 2013-07-18 DIAGNOSIS — J45901 Unspecified asthma with (acute) exacerbation: Secondary | ICD-10-CM | POA: Diagnosis not present

## 2013-07-18 DIAGNOSIS — Z9181 History of falling: Secondary | ICD-10-CM | POA: Diagnosis not present

## 2013-07-18 DIAGNOSIS — G309 Alzheimer's disease, unspecified: Secondary | ICD-10-CM | POA: Diagnosis not present

## 2013-07-18 DIAGNOSIS — M6281 Muscle weakness (generalized): Secondary | ICD-10-CM | POA: Diagnosis not present

## 2013-07-18 DIAGNOSIS — N189 Chronic kidney disease, unspecified: Secondary | ICD-10-CM | POA: Diagnosis not present

## 2013-07-18 DIAGNOSIS — I129 Hypertensive chronic kidney disease with stage 1 through stage 4 chronic kidney disease, or unspecified chronic kidney disease: Secondary | ICD-10-CM | POA: Diagnosis not present

## 2013-07-18 DIAGNOSIS — J449 Chronic obstructive pulmonary disease, unspecified: Secondary | ICD-10-CM | POA: Diagnosis not present

## 2013-07-18 DIAGNOSIS — Z87440 Personal history of urinary (tract) infections: Secondary | ICD-10-CM | POA: Diagnosis not present

## 2013-07-18 DIAGNOSIS — F0280 Dementia in other diseases classified elsewhere without behavioral disturbance: Secondary | ICD-10-CM | POA: Diagnosis not present

## 2013-07-18 DIAGNOSIS — J441 Chronic obstructive pulmonary disease with (acute) exacerbation: Secondary | ICD-10-CM | POA: Diagnosis not present

## 2013-07-18 DIAGNOSIS — Z9981 Dependence on supplemental oxygen: Secondary | ICD-10-CM | POA: Diagnosis not present

## 2013-07-22 DIAGNOSIS — I10 Essential (primary) hypertension: Secondary | ICD-10-CM | POA: Diagnosis not present

## 2013-07-22 DIAGNOSIS — R062 Wheezing: Secondary | ICD-10-CM | POA: Diagnosis not present

## 2013-07-22 DIAGNOSIS — J209 Acute bronchitis, unspecified: Secondary | ICD-10-CM | POA: Diagnosis not present

## 2013-07-22 DIAGNOSIS — E039 Hypothyroidism, unspecified: Secondary | ICD-10-CM | POA: Diagnosis not present

## 2013-07-22 DIAGNOSIS — J449 Chronic obstructive pulmonary disease, unspecified: Secondary | ICD-10-CM | POA: Diagnosis not present

## 2013-07-22 DIAGNOSIS — R0602 Shortness of breath: Secondary | ICD-10-CM | POA: Diagnosis not present

## 2013-07-23 DIAGNOSIS — N189 Chronic kidney disease, unspecified: Secondary | ICD-10-CM | POA: Diagnosis not present

## 2013-07-23 DIAGNOSIS — M6281 Muscle weakness (generalized): Secondary | ICD-10-CM | POA: Diagnosis not present

## 2013-07-23 DIAGNOSIS — G309 Alzheimer's disease, unspecified: Secondary | ICD-10-CM | POA: Diagnosis not present

## 2013-07-23 DIAGNOSIS — F0280 Dementia in other diseases classified elsewhere without behavioral disturbance: Secondary | ICD-10-CM | POA: Diagnosis not present

## 2013-07-23 DIAGNOSIS — J449 Chronic obstructive pulmonary disease, unspecified: Secondary | ICD-10-CM | POA: Diagnosis not present

## 2013-07-23 DIAGNOSIS — J441 Chronic obstructive pulmonary disease with (acute) exacerbation: Secondary | ICD-10-CM | POA: Diagnosis not present

## 2013-07-23 DIAGNOSIS — I129 Hypertensive chronic kidney disease with stage 1 through stage 4 chronic kidney disease, or unspecified chronic kidney disease: Secondary | ICD-10-CM | POA: Diagnosis not present

## 2013-07-24 DIAGNOSIS — F0280 Dementia in other diseases classified elsewhere without behavioral disturbance: Secondary | ICD-10-CM | POA: Diagnosis not present

## 2013-07-24 DIAGNOSIS — G309 Alzheimer's disease, unspecified: Secondary | ICD-10-CM | POA: Diagnosis not present

## 2013-07-24 DIAGNOSIS — J449 Chronic obstructive pulmonary disease, unspecified: Secondary | ICD-10-CM | POA: Diagnosis not present

## 2013-07-24 DIAGNOSIS — J441 Chronic obstructive pulmonary disease with (acute) exacerbation: Secondary | ICD-10-CM | POA: Diagnosis not present

## 2013-07-24 DIAGNOSIS — J45901 Unspecified asthma with (acute) exacerbation: Secondary | ICD-10-CM | POA: Diagnosis not present

## 2013-07-24 DIAGNOSIS — M6281 Muscle weakness (generalized): Secondary | ICD-10-CM | POA: Diagnosis not present

## 2013-07-25 DIAGNOSIS — M6281 Muscle weakness (generalized): Secondary | ICD-10-CM | POA: Diagnosis not present

## 2013-07-25 DIAGNOSIS — G309 Alzheimer's disease, unspecified: Secondary | ICD-10-CM | POA: Diagnosis not present

## 2013-07-25 DIAGNOSIS — F0280 Dementia in other diseases classified elsewhere without behavioral disturbance: Secondary | ICD-10-CM | POA: Diagnosis not present

## 2013-07-25 DIAGNOSIS — J449 Chronic obstructive pulmonary disease, unspecified: Secondary | ICD-10-CM | POA: Diagnosis not present

## 2013-07-25 DIAGNOSIS — J441 Chronic obstructive pulmonary disease with (acute) exacerbation: Secondary | ICD-10-CM | POA: Diagnosis not present

## 2013-07-28 DIAGNOSIS — J441 Chronic obstructive pulmonary disease with (acute) exacerbation: Secondary | ICD-10-CM | POA: Diagnosis not present

## 2013-07-28 DIAGNOSIS — N183 Chronic kidney disease, stage 3 unspecified: Secondary | ICD-10-CM | POA: Diagnosis not present

## 2013-07-28 DIAGNOSIS — R609 Edema, unspecified: Secondary | ICD-10-CM | POA: Diagnosis not present

## 2013-07-28 DIAGNOSIS — I10 Essential (primary) hypertension: Secondary | ICD-10-CM | POA: Diagnosis not present

## 2013-07-28 DIAGNOSIS — J449 Chronic obstructive pulmonary disease, unspecified: Secondary | ICD-10-CM | POA: Diagnosis not present

## 2013-07-28 DIAGNOSIS — G309 Alzheimer's disease, unspecified: Secondary | ICD-10-CM | POA: Diagnosis not present

## 2013-07-28 DIAGNOSIS — E559 Vitamin D deficiency, unspecified: Secondary | ICD-10-CM | POA: Diagnosis not present

## 2013-07-28 DIAGNOSIS — M6281 Muscle weakness (generalized): Secondary | ICD-10-CM | POA: Diagnosis not present

## 2013-07-28 DIAGNOSIS — J45901 Unspecified asthma with (acute) exacerbation: Secondary | ICD-10-CM | POA: Diagnosis not present

## 2013-07-28 DIAGNOSIS — F0280 Dementia in other diseases classified elsewhere without behavioral disturbance: Secondary | ICD-10-CM | POA: Diagnosis not present

## 2013-07-30 DIAGNOSIS — F0280 Dementia in other diseases classified elsewhere without behavioral disturbance: Secondary | ICD-10-CM | POA: Diagnosis not present

## 2013-07-30 DIAGNOSIS — G309 Alzheimer's disease, unspecified: Secondary | ICD-10-CM | POA: Diagnosis not present

## 2013-07-30 DIAGNOSIS — J441 Chronic obstructive pulmonary disease with (acute) exacerbation: Secondary | ICD-10-CM | POA: Diagnosis not present

## 2013-07-30 DIAGNOSIS — J449 Chronic obstructive pulmonary disease, unspecified: Secondary | ICD-10-CM | POA: Diagnosis not present

## 2013-07-30 DIAGNOSIS — M6281 Muscle weakness (generalized): Secondary | ICD-10-CM | POA: Diagnosis not present

## 2013-08-01 DIAGNOSIS — M6281 Muscle weakness (generalized): Secondary | ICD-10-CM | POA: Diagnosis not present

## 2013-08-01 DIAGNOSIS — F0280 Dementia in other diseases classified elsewhere without behavioral disturbance: Secondary | ICD-10-CM | POA: Diagnosis not present

## 2013-08-01 DIAGNOSIS — J449 Chronic obstructive pulmonary disease, unspecified: Secondary | ICD-10-CM | POA: Diagnosis not present

## 2013-08-01 DIAGNOSIS — G309 Alzheimer's disease, unspecified: Secondary | ICD-10-CM | POA: Diagnosis not present

## 2013-08-01 DIAGNOSIS — J441 Chronic obstructive pulmonary disease with (acute) exacerbation: Secondary | ICD-10-CM | POA: Diagnosis not present

## 2013-08-06 DIAGNOSIS — J449 Chronic obstructive pulmonary disease, unspecified: Secondary | ICD-10-CM | POA: Diagnosis not present

## 2013-08-06 DIAGNOSIS — G309 Alzheimer's disease, unspecified: Secondary | ICD-10-CM | POA: Diagnosis not present

## 2013-08-06 DIAGNOSIS — F0280 Dementia in other diseases classified elsewhere without behavioral disturbance: Secondary | ICD-10-CM | POA: Diagnosis not present

## 2013-08-06 DIAGNOSIS — J441 Chronic obstructive pulmonary disease with (acute) exacerbation: Secondary | ICD-10-CM | POA: Diagnosis not present

## 2013-08-06 DIAGNOSIS — M6281 Muscle weakness (generalized): Secondary | ICD-10-CM | POA: Diagnosis not present

## 2013-08-08 DIAGNOSIS — F3289 Other specified depressive episodes: Secondary | ICD-10-CM | POA: Diagnosis not present

## 2013-08-08 DIAGNOSIS — F329 Major depressive disorder, single episode, unspecified: Secondary | ICD-10-CM | POA: Diagnosis not present

## 2013-08-08 DIAGNOSIS — J441 Chronic obstructive pulmonary disease with (acute) exacerbation: Secondary | ICD-10-CM | POA: Diagnosis not present

## 2013-08-08 DIAGNOSIS — G309 Alzheimer's disease, unspecified: Secondary | ICD-10-CM | POA: Diagnosis not present

## 2013-08-08 DIAGNOSIS — J449 Chronic obstructive pulmonary disease, unspecified: Secondary | ICD-10-CM | POA: Diagnosis not present

## 2013-08-08 DIAGNOSIS — F0280 Dementia in other diseases classified elsewhere without behavioral disturbance: Secondary | ICD-10-CM | POA: Diagnosis not present

## 2013-08-08 DIAGNOSIS — F068 Other specified mental disorders due to known physiological condition: Secondary | ICD-10-CM | POA: Diagnosis not present

## 2013-08-08 DIAGNOSIS — M6281 Muscle weakness (generalized): Secondary | ICD-10-CM | POA: Diagnosis not present

## 2013-08-12 DIAGNOSIS — J441 Chronic obstructive pulmonary disease with (acute) exacerbation: Secondary | ICD-10-CM | POA: Diagnosis not present

## 2013-08-12 DIAGNOSIS — M6281 Muscle weakness (generalized): Secondary | ICD-10-CM | POA: Diagnosis not present

## 2013-08-12 DIAGNOSIS — G309 Alzheimer's disease, unspecified: Secondary | ICD-10-CM | POA: Diagnosis not present

## 2013-08-12 DIAGNOSIS — F0280 Dementia in other diseases classified elsewhere without behavioral disturbance: Secondary | ICD-10-CM | POA: Diagnosis not present

## 2013-08-12 DIAGNOSIS — J449 Chronic obstructive pulmonary disease, unspecified: Secondary | ICD-10-CM | POA: Diagnosis not present

## 2013-08-13 DIAGNOSIS — J441 Chronic obstructive pulmonary disease with (acute) exacerbation: Secondary | ICD-10-CM | POA: Diagnosis not present

## 2013-08-13 DIAGNOSIS — M6281 Muscle weakness (generalized): Secondary | ICD-10-CM | POA: Diagnosis not present

## 2013-08-13 DIAGNOSIS — J449 Chronic obstructive pulmonary disease, unspecified: Secondary | ICD-10-CM | POA: Diagnosis not present

## 2013-08-13 DIAGNOSIS — F0280 Dementia in other diseases classified elsewhere without behavioral disturbance: Secondary | ICD-10-CM | POA: Diagnosis not present

## 2013-08-15 DIAGNOSIS — M6281 Muscle weakness (generalized): Secondary | ICD-10-CM | POA: Diagnosis not present

## 2013-08-15 DIAGNOSIS — J449 Chronic obstructive pulmonary disease, unspecified: Secondary | ICD-10-CM | POA: Diagnosis not present

## 2013-08-15 DIAGNOSIS — J441 Chronic obstructive pulmonary disease with (acute) exacerbation: Secondary | ICD-10-CM | POA: Diagnosis not present

## 2013-08-15 DIAGNOSIS — F0280 Dementia in other diseases classified elsewhere without behavioral disturbance: Secondary | ICD-10-CM | POA: Diagnosis not present

## 2013-08-20 DIAGNOSIS — J449 Chronic obstructive pulmonary disease, unspecified: Secondary | ICD-10-CM | POA: Diagnosis not present

## 2013-08-20 DIAGNOSIS — J45901 Unspecified asthma with (acute) exacerbation: Secondary | ICD-10-CM | POA: Diagnosis not present

## 2013-08-20 DIAGNOSIS — M6281 Muscle weakness (generalized): Secondary | ICD-10-CM | POA: Diagnosis not present

## 2013-08-20 DIAGNOSIS — F0280 Dementia in other diseases classified elsewhere without behavioral disturbance: Secondary | ICD-10-CM | POA: Diagnosis not present

## 2013-08-20 DIAGNOSIS — J441 Chronic obstructive pulmonary disease with (acute) exacerbation: Secondary | ICD-10-CM | POA: Diagnosis not present

## 2013-08-21 DIAGNOSIS — F0280 Dementia in other diseases classified elsewhere without behavioral disturbance: Secondary | ICD-10-CM | POA: Diagnosis not present

## 2013-08-21 DIAGNOSIS — J441 Chronic obstructive pulmonary disease with (acute) exacerbation: Secondary | ICD-10-CM | POA: Diagnosis not present

## 2013-08-21 DIAGNOSIS — G309 Alzheimer's disease, unspecified: Secondary | ICD-10-CM | POA: Diagnosis not present

## 2013-08-21 DIAGNOSIS — M6281 Muscle weakness (generalized): Secondary | ICD-10-CM | POA: Diagnosis not present

## 2013-08-21 DIAGNOSIS — J449 Chronic obstructive pulmonary disease, unspecified: Secondary | ICD-10-CM | POA: Diagnosis not present

## 2013-08-22 DIAGNOSIS — F0280 Dementia in other diseases classified elsewhere without behavioral disturbance: Secondary | ICD-10-CM | POA: Diagnosis not present

## 2013-08-22 DIAGNOSIS — G309 Alzheimer's disease, unspecified: Secondary | ICD-10-CM | POA: Diagnosis not present

## 2013-08-22 DIAGNOSIS — J441 Chronic obstructive pulmonary disease with (acute) exacerbation: Secondary | ICD-10-CM | POA: Diagnosis not present

## 2013-08-22 DIAGNOSIS — M6281 Muscle weakness (generalized): Secondary | ICD-10-CM | POA: Diagnosis not present

## 2013-08-22 DIAGNOSIS — J449 Chronic obstructive pulmonary disease, unspecified: Secondary | ICD-10-CM | POA: Diagnosis not present

## 2013-08-22 DIAGNOSIS — J45901 Unspecified asthma with (acute) exacerbation: Secondary | ICD-10-CM | POA: Diagnosis not present

## 2013-08-26 DIAGNOSIS — F0280 Dementia in other diseases classified elsewhere without behavioral disturbance: Secondary | ICD-10-CM | POA: Diagnosis not present

## 2013-08-26 DIAGNOSIS — M6281 Muscle weakness (generalized): Secondary | ICD-10-CM | POA: Diagnosis not present

## 2013-08-26 DIAGNOSIS — J441 Chronic obstructive pulmonary disease with (acute) exacerbation: Secondary | ICD-10-CM | POA: Diagnosis not present

## 2013-08-26 DIAGNOSIS — J45901 Unspecified asthma with (acute) exacerbation: Secondary | ICD-10-CM | POA: Diagnosis not present

## 2013-08-26 DIAGNOSIS — J449 Chronic obstructive pulmonary disease, unspecified: Secondary | ICD-10-CM | POA: Diagnosis not present

## 2013-08-27 DIAGNOSIS — F0280 Dementia in other diseases classified elsewhere without behavioral disturbance: Secondary | ICD-10-CM | POA: Diagnosis not present

## 2013-08-27 DIAGNOSIS — M6281 Muscle weakness (generalized): Secondary | ICD-10-CM | POA: Diagnosis not present

## 2013-08-27 DIAGNOSIS — G309 Alzheimer's disease, unspecified: Secondary | ICD-10-CM | POA: Diagnosis not present

## 2013-08-27 DIAGNOSIS — J449 Chronic obstructive pulmonary disease, unspecified: Secondary | ICD-10-CM | POA: Diagnosis not present

## 2013-08-27 DIAGNOSIS — J441 Chronic obstructive pulmonary disease with (acute) exacerbation: Secondary | ICD-10-CM | POA: Diagnosis not present

## 2013-09-04 DIAGNOSIS — J449 Chronic obstructive pulmonary disease, unspecified: Secondary | ICD-10-CM | POA: Diagnosis not present

## 2013-09-04 DIAGNOSIS — J45901 Unspecified asthma with (acute) exacerbation: Secondary | ICD-10-CM | POA: Diagnosis not present

## 2013-09-04 DIAGNOSIS — M6281 Muscle weakness (generalized): Secondary | ICD-10-CM | POA: Diagnosis not present

## 2013-09-04 DIAGNOSIS — G309 Alzheimer's disease, unspecified: Secondary | ICD-10-CM | POA: Diagnosis not present

## 2013-09-04 DIAGNOSIS — J441 Chronic obstructive pulmonary disease with (acute) exacerbation: Secondary | ICD-10-CM | POA: Diagnosis not present

## 2013-09-04 DIAGNOSIS — F0280 Dementia in other diseases classified elsewhere without behavioral disturbance: Secondary | ICD-10-CM | POA: Diagnosis not present

## 2013-09-23 DIAGNOSIS — L820 Inflamed seborrheic keratosis: Secondary | ICD-10-CM | POA: Diagnosis not present

## 2013-09-23 DIAGNOSIS — L570 Actinic keratosis: Secondary | ICD-10-CM | POA: Diagnosis not present

## 2013-09-23 DIAGNOSIS — L578 Other skin changes due to chronic exposure to nonionizing radiation: Secondary | ICD-10-CM | POA: Diagnosis not present

## 2013-09-23 DIAGNOSIS — L408 Other psoriasis: Secondary | ICD-10-CM | POA: Diagnosis not present

## 2013-10-02 DIAGNOSIS — J449 Chronic obstructive pulmonary disease, unspecified: Secondary | ICD-10-CM | POA: Diagnosis not present

## 2013-10-02 DIAGNOSIS — J309 Allergic rhinitis, unspecified: Secondary | ICD-10-CM | POA: Diagnosis not present

## 2013-10-06 DIAGNOSIS — M79609 Pain in unspecified limb: Secondary | ICD-10-CM | POA: Diagnosis not present

## 2013-10-06 DIAGNOSIS — B351 Tinea unguium: Secondary | ICD-10-CM | POA: Diagnosis not present

## 2013-10-10 ENCOUNTER — Emergency Department: Payer: Self-pay | Admitting: Emergency Medicine

## 2013-10-10 DIAGNOSIS — I10 Essential (primary) hypertension: Secondary | ICD-10-CM | POA: Diagnosis not present

## 2013-10-10 DIAGNOSIS — Z8679 Personal history of other diseases of the circulatory system: Secondary | ICD-10-CM | POA: Diagnosis not present

## 2013-10-10 DIAGNOSIS — Z79899 Other long term (current) drug therapy: Secondary | ICD-10-CM | POA: Diagnosis not present

## 2013-10-10 DIAGNOSIS — S4980XA Other specified injuries of shoulder and upper arm, unspecified arm, initial encounter: Secondary | ICD-10-CM | POA: Diagnosis not present

## 2013-10-10 DIAGNOSIS — M25519 Pain in unspecified shoulder: Secondary | ICD-10-CM | POA: Diagnosis not present

## 2013-10-10 DIAGNOSIS — S52609A Unspecified fracture of lower end of unspecified ulna, initial encounter for closed fracture: Secondary | ICD-10-CM | POA: Diagnosis not present

## 2013-10-10 DIAGNOSIS — S59909A Unspecified injury of unspecified elbow, initial encounter: Secondary | ICD-10-CM | POA: Diagnosis not present

## 2013-10-10 DIAGNOSIS — S46909A Unspecified injury of unspecified muscle, fascia and tendon at shoulder and upper arm level, unspecified arm, initial encounter: Secondary | ICD-10-CM | POA: Diagnosis not present

## 2013-10-10 DIAGNOSIS — S6990XA Unspecified injury of unspecified wrist, hand and finger(s), initial encounter: Secondary | ICD-10-CM | POA: Diagnosis not present

## 2013-10-10 DIAGNOSIS — R9431 Abnormal electrocardiogram [ECG] [EKG]: Secondary | ICD-10-CM | POA: Diagnosis not present

## 2013-10-10 DIAGNOSIS — S52509A Unspecified fracture of the lower end of unspecified radius, initial encounter for closed fracture: Secondary | ICD-10-CM | POA: Diagnosis not present

## 2013-10-10 DIAGNOSIS — J45909 Unspecified asthma, uncomplicated: Secondary | ICD-10-CM | POA: Diagnosis not present

## 2013-10-10 DIAGNOSIS — IMO0002 Reserved for concepts with insufficient information to code with codable children: Secondary | ICD-10-CM | POA: Diagnosis not present

## 2013-10-10 DIAGNOSIS — S51809A Unspecified open wound of unspecified forearm, initial encounter: Secondary | ICD-10-CM | POA: Diagnosis not present

## 2013-10-10 DIAGNOSIS — S52539A Colles' fracture of unspecified radius, initial encounter for closed fracture: Secondary | ICD-10-CM | POA: Diagnosis not present

## 2013-10-10 LAB — CBC
HCT: 38.3 % (ref 35.0–47.0)
HGB: 12.5 g/dL (ref 12.0–16.0)
MCH: 31.8 pg (ref 26.0–34.0)
MCHC: 32.7 g/dL (ref 32.0–36.0)
MCV: 98 fL (ref 80–100)
PLATELETS: 139 10*3/uL — AB (ref 150–440)
RBC: 3.93 10*6/uL (ref 3.80–5.20)
RDW: 13.9 % (ref 11.5–14.5)
WBC: 6 10*3/uL (ref 3.6–11.0)

## 2013-10-10 LAB — COMPREHENSIVE METABOLIC PANEL
ALBUMIN: 3.4 g/dL (ref 3.4–5.0)
Alkaline Phosphatase: 80 U/L
Anion Gap: 3 — ABNORMAL LOW (ref 7–16)
BILIRUBIN TOTAL: 0.5 mg/dL (ref 0.2–1.0)
BUN: 19 mg/dL — ABNORMAL HIGH (ref 7–18)
CHLORIDE: 107 mmol/L (ref 98–107)
CO2: 31 mmol/L (ref 21–32)
Calcium, Total: 8.8 mg/dL (ref 8.5–10.1)
Creatinine: 1.41 mg/dL — ABNORMAL HIGH (ref 0.60–1.30)
EGFR (African American): 38 — ABNORMAL LOW
GFR CALC NON AF AMER: 33 — AB
GLUCOSE: 86 mg/dL (ref 65–99)
OSMOLALITY: 283 (ref 275–301)
POTASSIUM: 4.3 mmol/L (ref 3.5–5.1)
SGOT(AST): 11 U/L — ABNORMAL LOW (ref 15–37)
SGPT (ALT): 22 U/L (ref 12–78)
Sodium: 141 mmol/L (ref 136–145)
TOTAL PROTEIN: 6.4 g/dL (ref 6.4–8.2)

## 2013-10-10 LAB — TROPONIN I: Troponin-I: <0.02 ng/mL

## 2013-10-15 DIAGNOSIS — S52539A Colles' fracture of unspecified radius, initial encounter for closed fracture: Secondary | ICD-10-CM | POA: Diagnosis not present

## 2013-10-20 ENCOUNTER — Emergency Department: Payer: Self-pay | Admitting: Emergency Medicine

## 2013-10-20 DIAGNOSIS — F29 Unspecified psychosis not due to a substance or known physiological condition: Secondary | ICD-10-CM | POA: Diagnosis not present

## 2013-10-20 DIAGNOSIS — Z79899 Other long term (current) drug therapy: Secondary | ICD-10-CM | POA: Diagnosis not present

## 2013-10-20 DIAGNOSIS — I10 Essential (primary) hypertension: Secondary | ICD-10-CM | POA: Diagnosis not present

## 2013-10-20 DIAGNOSIS — I498 Other specified cardiac arrhythmias: Secondary | ICD-10-CM | POA: Diagnosis not present

## 2013-10-20 DIAGNOSIS — R4182 Altered mental status, unspecified: Secondary | ICD-10-CM | POA: Diagnosis not present

## 2013-10-20 DIAGNOSIS — J45909 Unspecified asthma, uncomplicated: Secondary | ICD-10-CM | POA: Diagnosis not present

## 2013-10-20 DIAGNOSIS — Z66 Do not resuscitate: Secondary | ICD-10-CM | POA: Diagnosis not present

## 2013-10-20 DIAGNOSIS — N390 Urinary tract infection, site not specified: Secondary | ICD-10-CM | POA: Diagnosis not present

## 2013-10-20 DIAGNOSIS — R05 Cough: Secondary | ICD-10-CM | POA: Diagnosis not present

## 2013-10-20 DIAGNOSIS — Z8679 Personal history of other diseases of the circulatory system: Secondary | ICD-10-CM | POA: Diagnosis not present

## 2013-10-20 DIAGNOSIS — R059 Cough, unspecified: Secondary | ICD-10-CM | POA: Diagnosis not present

## 2013-10-20 DIAGNOSIS — E039 Hypothyroidism, unspecified: Secondary | ICD-10-CM | POA: Diagnosis not present

## 2013-10-20 LAB — URINALYSIS, COMPLETE
Bacteria: NONE SEEN
Bilirubin,UR: NEGATIVE
Blood: NEGATIVE
Glucose,UR: NEGATIVE mg/dL (ref 0–75)
KETONE: NEGATIVE
NITRITE: NEGATIVE
PH: 6 (ref 4.5–8.0)
Protein: 30
Specific Gravity: 1.016 (ref 1.003–1.030)
Squamous Epithelial: 2

## 2013-10-20 LAB — COMPREHENSIVE METABOLIC PANEL
ALBUMIN: 3.1 g/dL — AB (ref 3.4–5.0)
ALK PHOS: 151 U/L — AB
Anion Gap: 5 — ABNORMAL LOW (ref 7–16)
BILIRUBIN TOTAL: 0.4 mg/dL (ref 0.2–1.0)
BUN: 21 mg/dL — ABNORMAL HIGH (ref 7–18)
Calcium, Total: 9.4 mg/dL (ref 8.5–10.1)
Chloride: 104 mmol/L (ref 98–107)
Co2: 29 mmol/L (ref 21–32)
Creatinine: 1.28 mg/dL (ref 0.60–1.30)
EGFR (African American): 43 — ABNORMAL LOW
EGFR (Non-African Amer.): 37 — ABNORMAL LOW
GLUCOSE: 99 mg/dL (ref 65–99)
OSMOLALITY: 279 (ref 275–301)
POTASSIUM: 4.3 mmol/L (ref 3.5–5.1)
SGOT(AST): 27 U/L (ref 15–37)
SGPT (ALT): 42 U/L (ref 12–78)
SODIUM: 138 mmol/L (ref 136–145)
TOTAL PROTEIN: 7 g/dL (ref 6.4–8.2)

## 2013-10-20 LAB — CBC
HCT: 34.5 % — AB (ref 35.0–47.0)
HGB: 11.5 g/dL — AB (ref 12.0–16.0)
MCH: 31.9 pg (ref 26.0–34.0)
MCHC: 33.4 g/dL (ref 32.0–36.0)
MCV: 96 fL (ref 80–100)
Platelet: 347 10*3/uL (ref 150–440)
RBC: 3.61 10*6/uL — ABNORMAL LOW (ref 3.80–5.20)
RDW: 13.7 % (ref 11.5–14.5)
WBC: 6.6 10*3/uL (ref 3.6–11.0)

## 2013-10-20 LAB — TROPONIN I: Troponin-I: <0.02 ng/mL

## 2013-10-20 LAB — CK TOTAL AND CKMB (NOT AT ARMC)
CK, Total: 53 U/L
CK-MB: 0.7 ng/mL (ref 0.5–3.6)

## 2013-10-23 LAB — URINE CULTURE

## 2013-10-24 DIAGNOSIS — S52539A Colles' fracture of unspecified radius, initial encounter for closed fracture: Secondary | ICD-10-CM | POA: Diagnosis not present

## 2013-11-06 DIAGNOSIS — F411 Generalized anxiety disorder: Secondary | ICD-10-CM | POA: Diagnosis not present

## 2013-11-06 DIAGNOSIS — R262 Difficulty in walking, not elsewhere classified: Secondary | ICD-10-CM | POA: Diagnosis not present

## 2013-11-06 DIAGNOSIS — M159 Polyosteoarthritis, unspecified: Secondary | ICD-10-CM | POA: Diagnosis not present

## 2013-11-06 DIAGNOSIS — M545 Low back pain, unspecified: Secondary | ICD-10-CM | POA: Diagnosis not present

## 2013-11-06 DIAGNOSIS — M25559 Pain in unspecified hip: Secondary | ICD-10-CM | POA: Diagnosis not present

## 2013-11-06 DIAGNOSIS — E039 Hypothyroidism, unspecified: Secondary | ICD-10-CM | POA: Diagnosis not present

## 2013-11-06 DIAGNOSIS — I10 Essential (primary) hypertension: Secondary | ICD-10-CM | POA: Diagnosis not present

## 2013-11-18 DIAGNOSIS — I10 Essential (primary) hypertension: Secondary | ICD-10-CM | POA: Diagnosis not present

## 2013-11-18 DIAGNOSIS — N183 Chronic kidney disease, stage 3 unspecified: Secondary | ICD-10-CM | POA: Diagnosis not present

## 2013-11-18 DIAGNOSIS — R809 Proteinuria, unspecified: Secondary | ICD-10-CM | POA: Diagnosis not present

## 2013-11-18 DIAGNOSIS — R609 Edema, unspecified: Secondary | ICD-10-CM | POA: Diagnosis not present

## 2013-11-19 DIAGNOSIS — I10 Essential (primary) hypertension: Secondary | ICD-10-CM | POA: Diagnosis not present

## 2013-11-19 DIAGNOSIS — N183 Chronic kidney disease, stage 3 unspecified: Secondary | ICD-10-CM | POA: Diagnosis not present

## 2013-11-19 DIAGNOSIS — R609 Edema, unspecified: Secondary | ICD-10-CM | POA: Diagnosis not present

## 2013-11-19 DIAGNOSIS — R809 Proteinuria, unspecified: Secondary | ICD-10-CM | POA: Diagnosis not present

## 2013-12-08 DIAGNOSIS — S52539A Colles' fracture of unspecified radius, initial encounter for closed fracture: Secondary | ICD-10-CM | POA: Diagnosis not present

## 2013-12-12 DIAGNOSIS — H02059 Trichiasis without entropian unspecified eye, unspecified eyelid: Secondary | ICD-10-CM | POA: Diagnosis not present

## 2013-12-12 DIAGNOSIS — F411 Generalized anxiety disorder: Secondary | ICD-10-CM | POA: Diagnosis not present

## 2013-12-12 DIAGNOSIS — H353190 Nonexudative age-related macular degeneration, unspecified eye, stage unspecified: Secondary | ICD-10-CM | POA: Diagnosis not present

## 2013-12-15 DIAGNOSIS — F3289 Other specified depressive episodes: Secondary | ICD-10-CM | POA: Diagnosis not present

## 2013-12-15 DIAGNOSIS — M159 Polyosteoarthritis, unspecified: Secondary | ICD-10-CM | POA: Diagnosis not present

## 2013-12-15 DIAGNOSIS — R569 Unspecified convulsions: Secondary | ICD-10-CM | POA: Diagnosis not present

## 2013-12-15 DIAGNOSIS — I10 Essential (primary) hypertension: Secondary | ICD-10-CM | POA: Diagnosis not present

## 2013-12-15 DIAGNOSIS — F0280 Dementia in other diseases classified elsewhere without behavioral disturbance: Secondary | ICD-10-CM | POA: Diagnosis not present

## 2013-12-15 DIAGNOSIS — H3530 Unspecified macular degeneration: Secondary | ICD-10-CM | POA: Diagnosis not present

## 2013-12-15 DIAGNOSIS — G309 Alzheimer's disease, unspecified: Secondary | ICD-10-CM | POA: Diagnosis not present

## 2013-12-15 DIAGNOSIS — M76899 Other specified enthesopathies of unspecified lower limb, excluding foot: Secondary | ICD-10-CM | POA: Diagnosis not present

## 2013-12-15 DIAGNOSIS — F329 Major depressive disorder, single episode, unspecified: Secondary | ICD-10-CM | POA: Diagnosis not present

## 2013-12-16 DIAGNOSIS — F0280 Dementia in other diseases classified elsewhere without behavioral disturbance: Secondary | ICD-10-CM | POA: Diagnosis not present

## 2013-12-16 DIAGNOSIS — I10 Essential (primary) hypertension: Secondary | ICD-10-CM | POA: Diagnosis not present

## 2013-12-16 DIAGNOSIS — F329 Major depressive disorder, single episode, unspecified: Secondary | ICD-10-CM | POA: Diagnosis not present

## 2013-12-16 DIAGNOSIS — F3289 Other specified depressive episodes: Secondary | ICD-10-CM | POA: Diagnosis not present

## 2013-12-16 DIAGNOSIS — M76899 Other specified enthesopathies of unspecified lower limb, excluding foot: Secondary | ICD-10-CM | POA: Diagnosis not present

## 2013-12-16 DIAGNOSIS — M159 Polyosteoarthritis, unspecified: Secondary | ICD-10-CM | POA: Diagnosis not present

## 2013-12-16 DIAGNOSIS — G309 Alzheimer's disease, unspecified: Secondary | ICD-10-CM | POA: Diagnosis not present

## 2013-12-22 DIAGNOSIS — M76899 Other specified enthesopathies of unspecified lower limb, excluding foot: Secondary | ICD-10-CM | POA: Diagnosis not present

## 2013-12-22 DIAGNOSIS — M159 Polyosteoarthritis, unspecified: Secondary | ICD-10-CM | POA: Diagnosis not present

## 2013-12-22 DIAGNOSIS — F329 Major depressive disorder, single episode, unspecified: Secondary | ICD-10-CM | POA: Diagnosis not present

## 2013-12-22 DIAGNOSIS — I10 Essential (primary) hypertension: Secondary | ICD-10-CM | POA: Diagnosis not present

## 2013-12-22 DIAGNOSIS — G309 Alzheimer's disease, unspecified: Secondary | ICD-10-CM | POA: Diagnosis not present

## 2013-12-22 DIAGNOSIS — F3289 Other specified depressive episodes: Secondary | ICD-10-CM | POA: Diagnosis not present

## 2013-12-22 DIAGNOSIS — F0280 Dementia in other diseases classified elsewhere without behavioral disturbance: Secondary | ICD-10-CM | POA: Diagnosis not present

## 2013-12-23 DIAGNOSIS — F3289 Other specified depressive episodes: Secondary | ICD-10-CM | POA: Diagnosis not present

## 2013-12-23 DIAGNOSIS — F329 Major depressive disorder, single episode, unspecified: Secondary | ICD-10-CM | POA: Diagnosis not present

## 2013-12-23 DIAGNOSIS — M76899 Other specified enthesopathies of unspecified lower limb, excluding foot: Secondary | ICD-10-CM | POA: Diagnosis not present

## 2013-12-23 DIAGNOSIS — B351 Tinea unguium: Secondary | ICD-10-CM | POA: Diagnosis not present

## 2013-12-23 DIAGNOSIS — F0280 Dementia in other diseases classified elsewhere without behavioral disturbance: Secondary | ICD-10-CM | POA: Diagnosis not present

## 2013-12-23 DIAGNOSIS — G309 Alzheimer's disease, unspecified: Secondary | ICD-10-CM | POA: Diagnosis not present

## 2013-12-23 DIAGNOSIS — I10 Essential (primary) hypertension: Secondary | ICD-10-CM | POA: Diagnosis not present

## 2013-12-23 DIAGNOSIS — M79609 Pain in unspecified limb: Secondary | ICD-10-CM | POA: Diagnosis not present

## 2013-12-23 DIAGNOSIS — M159 Polyosteoarthritis, unspecified: Secondary | ICD-10-CM | POA: Diagnosis not present

## 2013-12-25 DIAGNOSIS — R569 Unspecified convulsions: Secondary | ICD-10-CM | POA: Diagnosis not present

## 2013-12-25 DIAGNOSIS — G309 Alzheimer's disease, unspecified: Secondary | ICD-10-CM | POA: Diagnosis not present

## 2013-12-25 DIAGNOSIS — M76899 Other specified enthesopathies of unspecified lower limb, excluding foot: Secondary | ICD-10-CM | POA: Diagnosis not present

## 2013-12-25 DIAGNOSIS — F3289 Other specified depressive episodes: Secondary | ICD-10-CM | POA: Diagnosis not present

## 2013-12-25 DIAGNOSIS — M159 Polyosteoarthritis, unspecified: Secondary | ICD-10-CM | POA: Diagnosis not present

## 2013-12-25 DIAGNOSIS — I10 Essential (primary) hypertension: Secondary | ICD-10-CM | POA: Diagnosis not present

## 2013-12-25 DIAGNOSIS — F329 Major depressive disorder, single episode, unspecified: Secondary | ICD-10-CM | POA: Diagnosis not present

## 2013-12-25 DIAGNOSIS — H3530 Unspecified macular degeneration: Secondary | ICD-10-CM | POA: Diagnosis not present

## 2013-12-25 DIAGNOSIS — F0280 Dementia in other diseases classified elsewhere without behavioral disturbance: Secondary | ICD-10-CM | POA: Diagnosis not present

## 2013-12-26 DIAGNOSIS — M159 Polyosteoarthritis, unspecified: Secondary | ICD-10-CM | POA: Diagnosis not present

## 2013-12-26 DIAGNOSIS — F0280 Dementia in other diseases classified elsewhere without behavioral disturbance: Secondary | ICD-10-CM | POA: Diagnosis not present

## 2013-12-26 DIAGNOSIS — M76899 Other specified enthesopathies of unspecified lower limb, excluding foot: Secondary | ICD-10-CM | POA: Diagnosis not present

## 2013-12-26 DIAGNOSIS — F329 Major depressive disorder, single episode, unspecified: Secondary | ICD-10-CM | POA: Diagnosis not present

## 2013-12-26 DIAGNOSIS — F3289 Other specified depressive episodes: Secondary | ICD-10-CM | POA: Diagnosis not present

## 2013-12-26 DIAGNOSIS — I10 Essential (primary) hypertension: Secondary | ICD-10-CM | POA: Diagnosis not present

## 2013-12-29 DIAGNOSIS — M76899 Other specified enthesopathies of unspecified lower limb, excluding foot: Secondary | ICD-10-CM | POA: Diagnosis not present

## 2013-12-29 DIAGNOSIS — I10 Essential (primary) hypertension: Secondary | ICD-10-CM | POA: Diagnosis not present

## 2013-12-29 DIAGNOSIS — F329 Major depressive disorder, single episode, unspecified: Secondary | ICD-10-CM | POA: Diagnosis not present

## 2013-12-29 DIAGNOSIS — M159 Polyosteoarthritis, unspecified: Secondary | ICD-10-CM | POA: Diagnosis not present

## 2013-12-29 DIAGNOSIS — F3289 Other specified depressive episodes: Secondary | ICD-10-CM | POA: Diagnosis not present

## 2013-12-29 DIAGNOSIS — G309 Alzheimer's disease, unspecified: Secondary | ICD-10-CM | POA: Diagnosis not present

## 2013-12-29 DIAGNOSIS — F0280 Dementia in other diseases classified elsewhere without behavioral disturbance: Secondary | ICD-10-CM | POA: Diagnosis not present

## 2013-12-30 DIAGNOSIS — I10 Essential (primary) hypertension: Secondary | ICD-10-CM | POA: Diagnosis not present

## 2013-12-30 DIAGNOSIS — M159 Polyosteoarthritis, unspecified: Secondary | ICD-10-CM | POA: Diagnosis not present

## 2013-12-30 DIAGNOSIS — F3289 Other specified depressive episodes: Secondary | ICD-10-CM | POA: Diagnosis not present

## 2013-12-30 DIAGNOSIS — M76899 Other specified enthesopathies of unspecified lower limb, excluding foot: Secondary | ICD-10-CM | POA: Diagnosis not present

## 2013-12-30 DIAGNOSIS — F0280 Dementia in other diseases classified elsewhere without behavioral disturbance: Secondary | ICD-10-CM | POA: Diagnosis not present

## 2013-12-30 DIAGNOSIS — F329 Major depressive disorder, single episode, unspecified: Secondary | ICD-10-CM | POA: Diagnosis not present

## 2014-01-01 DIAGNOSIS — M159 Polyosteoarthritis, unspecified: Secondary | ICD-10-CM | POA: Diagnosis not present

## 2014-01-01 DIAGNOSIS — F3289 Other specified depressive episodes: Secondary | ICD-10-CM | POA: Diagnosis not present

## 2014-01-01 DIAGNOSIS — I10 Essential (primary) hypertension: Secondary | ICD-10-CM | POA: Diagnosis not present

## 2014-01-01 DIAGNOSIS — F329 Major depressive disorder, single episode, unspecified: Secondary | ICD-10-CM | POA: Diagnosis not present

## 2014-01-01 DIAGNOSIS — M76899 Other specified enthesopathies of unspecified lower limb, excluding foot: Secondary | ICD-10-CM | POA: Diagnosis not present

## 2014-01-01 DIAGNOSIS — G309 Alzheimer's disease, unspecified: Secondary | ICD-10-CM | POA: Diagnosis not present

## 2014-01-01 DIAGNOSIS — F0280 Dementia in other diseases classified elsewhere without behavioral disturbance: Secondary | ICD-10-CM | POA: Diagnosis not present

## 2014-01-05 DIAGNOSIS — M159 Polyosteoarthritis, unspecified: Secondary | ICD-10-CM | POA: Diagnosis not present

## 2014-01-05 DIAGNOSIS — G309 Alzheimer's disease, unspecified: Secondary | ICD-10-CM | POA: Diagnosis not present

## 2014-01-05 DIAGNOSIS — F329 Major depressive disorder, single episode, unspecified: Secondary | ICD-10-CM | POA: Diagnosis not present

## 2014-01-05 DIAGNOSIS — F3289 Other specified depressive episodes: Secondary | ICD-10-CM | POA: Diagnosis not present

## 2014-01-05 DIAGNOSIS — F0280 Dementia in other diseases classified elsewhere without behavioral disturbance: Secondary | ICD-10-CM | POA: Diagnosis not present

## 2014-01-05 DIAGNOSIS — I10 Essential (primary) hypertension: Secondary | ICD-10-CM | POA: Diagnosis not present

## 2014-01-05 DIAGNOSIS — M76899 Other specified enthesopathies of unspecified lower limb, excluding foot: Secondary | ICD-10-CM | POA: Diagnosis not present

## 2014-01-22 DIAGNOSIS — J449 Chronic obstructive pulmonary disease, unspecified: Secondary | ICD-10-CM | POA: Diagnosis not present

## 2014-01-22 DIAGNOSIS — F329 Major depressive disorder, single episode, unspecified: Secondary | ICD-10-CM | POA: Diagnosis not present

## 2014-01-22 DIAGNOSIS — R413 Other amnesia: Secondary | ICD-10-CM | POA: Diagnosis not present

## 2014-01-22 DIAGNOSIS — F3289 Other specified depressive episodes: Secondary | ICD-10-CM | POA: Diagnosis not present

## 2014-01-22 DIAGNOSIS — L03317 Cellulitis of buttock: Secondary | ICD-10-CM | POA: Diagnosis not present

## 2014-01-22 DIAGNOSIS — I10 Essential (primary) hypertension: Secondary | ICD-10-CM | POA: Diagnosis not present

## 2014-01-22 DIAGNOSIS — L0231 Cutaneous abscess of buttock: Secondary | ICD-10-CM | POA: Diagnosis not present

## 2014-01-22 DIAGNOSIS — R262 Difficulty in walking, not elsewhere classified: Secondary | ICD-10-CM | POA: Diagnosis not present

## 2014-01-22 DIAGNOSIS — E039 Hypothyroidism, unspecified: Secondary | ICD-10-CM | POA: Diagnosis not present

## 2014-01-23 DIAGNOSIS — S52539A Colles' fracture of unspecified radius, initial encounter for closed fracture: Secondary | ICD-10-CM | POA: Diagnosis not present

## 2014-02-03 DIAGNOSIS — I288 Other diseases of pulmonary vessels: Secondary | ICD-10-CM | POA: Diagnosis not present

## 2014-02-03 DIAGNOSIS — J449 Chronic obstructive pulmonary disease, unspecified: Secondary | ICD-10-CM | POA: Diagnosis not present

## 2014-03-03 DIAGNOSIS — R609 Edema, unspecified: Secondary | ICD-10-CM | POA: Diagnosis not present

## 2014-03-03 DIAGNOSIS — N183 Chronic kidney disease, stage 3 unspecified: Secondary | ICD-10-CM | POA: Diagnosis not present

## 2014-03-03 DIAGNOSIS — E559 Vitamin D deficiency, unspecified: Secondary | ICD-10-CM | POA: Diagnosis not present

## 2014-03-03 DIAGNOSIS — I10 Essential (primary) hypertension: Secondary | ICD-10-CM | POA: Diagnosis not present

## 2014-03-04 DIAGNOSIS — N183 Chronic kidney disease, stage 3 unspecified: Secondary | ICD-10-CM | POA: Diagnosis not present

## 2014-03-05 DIAGNOSIS — I270 Primary pulmonary hypertension: Secondary | ICD-10-CM | POA: Diagnosis not present

## 2014-03-09 DIAGNOSIS — F0281 Dementia in other diseases classified elsewhere with behavioral disturbance: Secondary | ICD-10-CM | POA: Diagnosis not present

## 2014-03-09 DIAGNOSIS — R0602 Shortness of breath: Secondary | ICD-10-CM | POA: Diagnosis not present

## 2014-03-09 DIAGNOSIS — R262 Difficulty in walking, not elsewhere classified: Secondary | ICD-10-CM | POA: Diagnosis not present

## 2014-03-09 DIAGNOSIS — I270 Primary pulmonary hypertension: Secondary | ICD-10-CM | POA: Diagnosis not present

## 2014-03-09 DIAGNOSIS — J449 Chronic obstructive pulmonary disease, unspecified: Secondary | ICD-10-CM | POA: Diagnosis not present

## 2014-03-09 DIAGNOSIS — F02818 Dementia in other diseases classified elsewhere, unspecified severity, with other behavioral disturbance: Secondary | ICD-10-CM | POA: Diagnosis not present

## 2014-04-21 DIAGNOSIS — Z23 Encounter for immunization: Secondary | ICD-10-CM | POA: Diagnosis not present

## 2014-04-29 DIAGNOSIS — R300 Dysuria: Secondary | ICD-10-CM | POA: Diagnosis not present

## 2014-04-29 DIAGNOSIS — N390 Urinary tract infection, site not specified: Secondary | ICD-10-CM | POA: Diagnosis not present

## 2014-05-06 DIAGNOSIS — B351 Tinea unguium: Secondary | ICD-10-CM | POA: Diagnosis not present

## 2014-05-06 DIAGNOSIS — M79672 Pain in left foot: Secondary | ICD-10-CM | POA: Diagnosis not present

## 2014-05-06 DIAGNOSIS — M79675 Pain in left toe(s): Secondary | ICD-10-CM | POA: Diagnosis not present

## 2014-07-07 DIAGNOSIS — H3531 Nonexudative age-related macular degeneration: Secondary | ICD-10-CM | POA: Diagnosis not present

## 2014-07-20 DIAGNOSIS — R0602 Shortness of breath: Secondary | ICD-10-CM | POA: Diagnosis not present

## 2014-07-20 DIAGNOSIS — J449 Chronic obstructive pulmonary disease, unspecified: Secondary | ICD-10-CM | POA: Diagnosis not present

## 2014-07-27 DIAGNOSIS — N183 Chronic kidney disease, stage 3 (moderate): Secondary | ICD-10-CM | POA: Diagnosis not present

## 2014-07-27 DIAGNOSIS — R600 Localized edema: Secondary | ICD-10-CM | POA: Diagnosis not present

## 2014-07-27 DIAGNOSIS — E559 Vitamin D deficiency, unspecified: Secondary | ICD-10-CM | POA: Diagnosis not present

## 2014-07-27 DIAGNOSIS — I10 Essential (primary) hypertension: Secondary | ICD-10-CM | POA: Diagnosis not present

## 2014-07-28 DIAGNOSIS — I10 Essential (primary) hypertension: Secondary | ICD-10-CM | POA: Diagnosis not present

## 2014-07-28 DIAGNOSIS — N183 Chronic kidney disease, stage 3 (moderate): Secondary | ICD-10-CM | POA: Diagnosis not present

## 2014-07-28 DIAGNOSIS — E559 Vitamin D deficiency, unspecified: Secondary | ICD-10-CM | POA: Diagnosis not present

## 2014-07-28 DIAGNOSIS — R600 Localized edema: Secondary | ICD-10-CM | POA: Diagnosis not present

## 2014-08-17 DIAGNOSIS — M24641 Ankylosis, right hand: Secondary | ICD-10-CM | POA: Diagnosis not present

## 2014-08-28 DIAGNOSIS — M810 Age-related osteoporosis without current pathological fracture: Secondary | ICD-10-CM | POA: Diagnosis not present

## 2014-08-28 DIAGNOSIS — F339 Major depressive disorder, recurrent, unspecified: Secondary | ICD-10-CM | POA: Diagnosis not present

## 2014-08-28 DIAGNOSIS — F0281 Dementia in other diseases classified elsewhere with behavioral disturbance: Secondary | ICD-10-CM | POA: Diagnosis not present

## 2014-08-28 DIAGNOSIS — I129 Hypertensive chronic kidney disease with stage 1 through stage 4 chronic kidney disease, or unspecified chronic kidney disease: Secondary | ICD-10-CM | POA: Diagnosis not present

## 2014-08-28 DIAGNOSIS — I279 Pulmonary heart disease, unspecified: Secondary | ICD-10-CM | POA: Diagnosis not present

## 2014-08-31 DIAGNOSIS — M79675 Pain in left toe(s): Secondary | ICD-10-CM | POA: Diagnosis not present

## 2014-08-31 DIAGNOSIS — B351 Tinea unguium: Secondary | ICD-10-CM | POA: Diagnosis not present

## 2014-09-10 DIAGNOSIS — M810 Age-related osteoporosis without current pathological fracture: Secondary | ICD-10-CM | POA: Diagnosis not present

## 2014-09-23 DIAGNOSIS — H1012 Acute atopic conjunctivitis, left eye: Secondary | ICD-10-CM | POA: Diagnosis not present

## 2014-10-02 DIAGNOSIS — H02105 Unspecified ectropion of left lower eyelid: Secondary | ICD-10-CM | POA: Diagnosis not present

## 2014-11-23 ENCOUNTER — Inpatient Hospital Stay
Admission: EM | Admit: 2014-11-23 | Discharge: 2014-11-27 | DRG: 871 | Disposition: A | Discharge status: Skilled Nursing Facility | Payer: Medicare Other | Attending: Internal Medicine | Admitting: Internal Medicine

## 2014-11-23 ENCOUNTER — Emergency Department: Payer: Medicare Other

## 2014-11-23 ENCOUNTER — Encounter: Payer: Self-pay | Admitting: Emergency Medicine

## 2014-11-23 DIAGNOSIS — Z9981 Dependence on supplemental oxygen: Secondary | ICD-10-CM

## 2014-11-23 DIAGNOSIS — A4151 Sepsis due to Escherichia coli [E. coli]: Secondary | ICD-10-CM | POA: Diagnosis not present

## 2014-11-23 DIAGNOSIS — J189 Pneumonia, unspecified organism: Secondary | ICD-10-CM | POA: Diagnosis present

## 2014-11-23 DIAGNOSIS — G309 Alzheimer's disease, unspecified: Secondary | ICD-10-CM | POA: Diagnosis present

## 2014-11-23 DIAGNOSIS — K219 Gastro-esophageal reflux disease without esophagitis: Secondary | ICD-10-CM | POA: Diagnosis present

## 2014-11-23 DIAGNOSIS — E079 Disorder of thyroid, unspecified: Secondary | ICD-10-CM | POA: Diagnosis present

## 2014-11-23 DIAGNOSIS — Z7951 Long term (current) use of inhaled steroids: Secondary | ICD-10-CM

## 2014-11-23 DIAGNOSIS — N170 Acute kidney failure with tubular necrosis: Secondary | ICD-10-CM | POA: Diagnosis present

## 2014-11-23 DIAGNOSIS — A419 Sepsis, unspecified organism: Secondary | ICD-10-CM

## 2014-11-23 DIAGNOSIS — J45909 Unspecified asthma, uncomplicated: Secondary | ICD-10-CM | POA: Insufficient documentation

## 2014-11-23 DIAGNOSIS — R918 Other nonspecific abnormal finding of lung field: Secondary | ICD-10-CM | POA: Diagnosis not present

## 2014-11-23 DIAGNOSIS — Z79899 Other long term (current) drug therapy: Secondary | ICD-10-CM

## 2014-11-23 DIAGNOSIS — B950 Streptococcus, group A, as the cause of diseases classified elsewhere: Secondary | ICD-10-CM | POA: Diagnosis not present

## 2014-11-23 DIAGNOSIS — Z66 Do not resuscitate: Secondary | ICD-10-CM | POA: Diagnosis present

## 2014-11-23 DIAGNOSIS — M1990 Unspecified osteoarthritis, unspecified site: Secondary | ICD-10-CM | POA: Diagnosis present

## 2014-11-23 DIAGNOSIS — I10 Essential (primary) hypertension: Secondary | ICD-10-CM | POA: Diagnosis present

## 2014-11-23 DIAGNOSIS — F329 Major depressive disorder, single episode, unspecified: Secondary | ICD-10-CM | POA: Diagnosis present

## 2014-11-23 DIAGNOSIS — R7881 Bacteremia: Secondary | ICD-10-CM | POA: Diagnosis not present

## 2014-11-23 DIAGNOSIS — R509 Fever, unspecified: Secondary | ICD-10-CM | POA: Diagnosis present

## 2014-11-23 DIAGNOSIS — F32A Depression, unspecified: Secondary | ICD-10-CM | POA: Insufficient documentation

## 2014-11-23 DIAGNOSIS — L309 Dermatitis, unspecified: Secondary | ICD-10-CM | POA: Diagnosis present

## 2014-11-23 DIAGNOSIS — N390 Urinary tract infection, site not specified: Secondary | ICD-10-CM | POA: Diagnosis present

## 2014-11-23 DIAGNOSIS — Z7982 Long term (current) use of aspirin: Secondary | ICD-10-CM | POA: Diagnosis not present

## 2014-11-23 DIAGNOSIS — G40909 Epilepsy, unspecified, not intractable, without status epilepticus: Secondary | ICD-10-CM | POA: Insufficient documentation

## 2014-11-23 DIAGNOSIS — F0390 Unspecified dementia without behavioral disturbance: Secondary | ICD-10-CM | POA: Insufficient documentation

## 2014-11-23 DIAGNOSIS — C569 Malignant neoplasm of unspecified ovary: Secondary | ICD-10-CM | POA: Insufficient documentation

## 2014-11-23 DIAGNOSIS — Z8673 Personal history of transient ischemic attack (TIA), and cerebral infarction without residual deficits: Secondary | ICD-10-CM | POA: Insufficient documentation

## 2014-11-23 DIAGNOSIS — R6520 Severe sepsis without septic shock: Secondary | ICD-10-CM | POA: Diagnosis present

## 2014-11-23 DIAGNOSIS — N189 Chronic kidney disease, unspecified: Secondary | ICD-10-CM | POA: Insufficient documentation

## 2014-11-23 DIAGNOSIS — B9620 Unspecified Escherichia coli [E. coli] as the cause of diseases classified elsewhere: Secondary | ICD-10-CM | POA: Diagnosis present

## 2014-11-23 DIAGNOSIS — R1011 Right upper quadrant pain: Secondary | ICD-10-CM | POA: Diagnosis present

## 2014-11-23 DIAGNOSIS — F419 Anxiety disorder, unspecified: Secondary | ICD-10-CM | POA: Diagnosis present

## 2014-11-23 DIAGNOSIS — R32 Unspecified urinary incontinence: Secondary | ICD-10-CM | POA: Insufficient documentation

## 2014-11-23 DIAGNOSIS — R69 Illness, unspecified: Secondary | ICD-10-CM | POA: Diagnosis not present

## 2014-11-23 DIAGNOSIS — Z87440 Personal history of urinary (tract) infections: Secondary | ICD-10-CM

## 2014-11-23 DIAGNOSIS — F0280 Dementia in other diseases classified elsewhere without behavioral disturbance: Secondary | ICD-10-CM | POA: Diagnosis present

## 2014-11-23 DIAGNOSIS — G459 Transient cerebral ischemic attack, unspecified: Secondary | ICD-10-CM | POA: Insufficient documentation

## 2014-11-23 DIAGNOSIS — Z743 Need for continuous supervision: Secondary | ICD-10-CM | POA: Diagnosis not present

## 2014-11-23 DIAGNOSIS — N179 Acute kidney failure, unspecified: Secondary | ICD-10-CM | POA: Diagnosis present

## 2014-11-23 DIAGNOSIS — K259 Gastric ulcer, unspecified as acute or chronic, without hemorrhage or perforation: Secondary | ICD-10-CM | POA: Insufficient documentation

## 2014-11-23 DIAGNOSIS — R0902 Hypoxemia: Secondary | ICD-10-CM

## 2014-11-23 DIAGNOSIS — E039 Hypothyroidism, unspecified: Secondary | ICD-10-CM | POA: Insufficient documentation

## 2014-11-23 HISTORY — DX: Unspecified osteoarthritis, unspecified site: M19.90

## 2014-11-23 HISTORY — DX: Major depressive disorder, single episode, unspecified: F32.9

## 2014-11-23 HISTORY — DX: Depression, unspecified: F32.A

## 2014-11-23 HISTORY — DX: Essential (primary) hypertension: I10

## 2014-11-23 HISTORY — DX: Unspecified asthma, uncomplicated: J45.909

## 2014-11-23 HISTORY — DX: Gastro-esophageal reflux disease without esophagitis: K21.9

## 2014-11-23 HISTORY — DX: Disorder of thyroid, unspecified: E07.9

## 2014-11-23 HISTORY — DX: Dermatitis, unspecified: L30.9

## 2014-11-23 HISTORY — DX: Unspecified dementia, unspecified severity, without behavioral disturbance, psychotic disturbance, mood disturbance, and anxiety: F03.90

## 2014-11-23 HISTORY — DX: Anxiety disorder, unspecified: F41.9

## 2014-11-23 LAB — CBC
HCT: 32.8 % — ABNORMAL LOW (ref 35.0–47.0)
Hemoglobin: 10.7 g/dL — ABNORMAL LOW (ref 12.0–16.0)
MCH: 31 pg (ref 26.0–34.0)
MCHC: 32.5 g/dL (ref 32.0–36.0)
MCV: 95.4 fL (ref 80.0–100.0)
Platelets: 117 10*3/uL — ABNORMAL LOW (ref 150–440)
RBC: 3.43 MIL/uL — AB (ref 3.80–5.20)
RDW: 13.9 % (ref 11.5–14.5)
WBC: 26 10*3/uL — ABNORMAL HIGH (ref 3.6–11.0)

## 2014-11-23 LAB — CBC WITH DIFFERENTIAL/PLATELET
Band Neutrophils: 9 %
Basophils Absolute: 0 10*3/uL (ref 0–0.1)
Basophils Relative: 0 %
EOS ABS: 0 10*3/uL (ref 0–0.7)
Eosinophils Relative: 0 %
HEMATOCRIT: 36.8 % (ref 35.0–47.0)
Hemoglobin: 12 g/dL (ref 12.0–16.0)
LYMPHS ABS: 1.4 10*3/uL (ref 1.0–3.6)
Lymphocytes Relative: 5 %
MCH: 31.2 pg (ref 26.0–34.0)
MCHC: 32.7 g/dL (ref 32.0–36.0)
MCV: 95.5 fL (ref 80.0–100.0)
MONO ABS: 0.6 10*3/uL (ref 0.2–0.9)
Metamyelocytes Relative: 2 %
Monocytes Relative: 2 %
NEUTROS PCT: 82 %
Neutro Abs: 26 10*3/uL — ABNORMAL HIGH (ref 1.4–6.5)
Platelets: 137 10*3/uL — ABNORMAL LOW (ref 150–440)
RBC: 3.85 MIL/uL (ref 3.80–5.20)
RDW: 13.8 % (ref 11.5–14.5)
WBC: 28 10*3/uL — ABNORMAL HIGH (ref 3.6–11.0)

## 2014-11-23 LAB — COMPREHENSIVE METABOLIC PANEL
ALK PHOS: 60 U/L (ref 38–126)
ALT: 21 U/L (ref 14–54)
AST: 29 U/L (ref 15–41)
Albumin: 3.4 g/dL — ABNORMAL LOW (ref 3.5–5.0)
Anion gap: 11 (ref 5–15)
BUN: 33 mg/dL — ABNORMAL HIGH (ref 6–20)
CALCIUM: 9.2 mg/dL (ref 8.9–10.3)
CO2: 25 mmol/L (ref 22–32)
Chloride: 102 mmol/L (ref 101–111)
Creatinine, Ser: 2.04 mg/dL — ABNORMAL HIGH (ref 0.44–1.00)
GFR calc non Af Amer: 20 mL/min — ABNORMAL LOW (ref >60)
GFR, EST AFRICAN AMERICAN: 24 mL/min — AB (ref >60)
Glucose, Bld: 110 mg/dL — ABNORMAL HIGH (ref 65–99)
Potassium: 4.3 mmol/L (ref 3.5–5.1)
Sodium: 138 mmol/L (ref 135–145)
TOTAL PROTEIN: 6.2 g/dL — AB (ref 6.5–8.1)
Total Bilirubin: 1.1 mg/dL (ref 0.3–1.2)

## 2014-11-23 LAB — HEPATIC FUNCTION PANEL
ALBUMIN: 2.9 g/dL — AB (ref 3.5–5.0)
ALK PHOS: 52 U/L (ref 38–126)
ALT: 16 U/L (ref 14–54)
AST: 26 U/L (ref 15–41)
Bilirubin, Direct: 0.2 mg/dL (ref 0.1–0.5)
Indirect Bilirubin: 0.8 mg/dL (ref 0.3–0.9)
Total Bilirubin: 1 mg/dL (ref 0.3–1.2)
Total Protein: 5.3 g/dL — ABNORMAL LOW (ref 6.5–8.1)

## 2014-11-23 LAB — CREATININE, SERUM
CREATININE: 1.92 mg/dL — AB (ref 0.44–1.00)
GFR calc Af Amer: 25 mL/min — ABNORMAL LOW (ref >60)
GFR calc non Af Amer: 22 mL/min — ABNORMAL LOW (ref >60)

## 2014-11-23 LAB — URINALYSIS COMPLETE WITH MICROSCOPIC (ARMC ONLY)
Bilirubin Urine: NEGATIVE
Glucose, UA: NEGATIVE mg/dL
Hgb urine dipstick: NEGATIVE
NITRITE: NEGATIVE
Protein, ur: 100 mg/dL — AB
SPECIFIC GRAVITY, URINE: 1.023 (ref 1.005–1.030)
SQUAMOUS EPITHELIAL / LPF: NONE SEEN
pH: 5 (ref 5.0–8.0)

## 2014-11-23 LAB — LIPASE, BLOOD: LIPASE: 22 U/L (ref 22–51)

## 2014-11-23 LAB — LACTIC ACID, PLASMA: Lactic Acid, Venous: 2.5 mmol/L (ref 0.5–2.0)

## 2014-11-23 MED ORDER — MOMETASONE FURO-FORMOTEROL FUM 100-5 MCG/ACT IN AERO
2.0000 | INHALATION_SPRAY | Freq: Two times a day (BID) | RESPIRATORY_TRACT | Status: DC
  Administered 2014-11-23 – 2014-11-27 (×8): 2 via RESPIRATORY_TRACT
  Filled 2014-11-23: qty 8.8

## 2014-11-23 MED ORDER — ONDANSETRON HCL 4 MG/2ML IJ SOLN: 4.0000 mg | Freq: Four times a day (QID) | INTRAMUSCULAR | Status: DC | PRN

## 2014-11-23 MED ORDER — SODIUM CHLORIDE 0.9 % IV SOLN
1000.0000 mL | Freq: Once | INTRAVENOUS | Status: DC
  Administered 2014-11-23: 1000 mL via INTRAVENOUS

## 2014-11-23 MED ORDER — SODIUM CHLORIDE 0.9 % IV SOLN
1000.0000 mL | Freq: Once | INTRAVENOUS | Status: AC
  Administered 2014-11-23: 1000 mL via INTRAVENOUS

## 2014-11-23 MED ORDER — SODIUM CHLORIDE 0.9 % IV SOLN: 1000.0000 mL | Freq: Once | INTRAVENOUS | Status: DC

## 2014-11-23 MED ORDER — AQUAPHOR EX OINT
1.0000 "application " | TOPICAL_OINTMENT | Freq: Four times a day (QID) | CUTANEOUS | Status: DC | PRN
  Filled 2014-11-23: qty 50

## 2014-11-23 MED ORDER — TRAMADOL HCL 50 MG PO TABS: 50.0000 mg | ORAL_TABLET | Freq: Four times a day (QID) | ORAL | Status: DC | PRN

## 2014-11-23 MED ORDER — ACETAMINOPHEN 325 MG PO TABS: 650.0000 mg | ORAL_TABLET | Freq: Four times a day (QID) | ORAL | Status: DC | PRN

## 2014-11-23 MED ORDER — PANTOPRAZOLE SODIUM 40 MG PO TBEC
40.0000 mg | DELAYED_RELEASE_TABLET | Freq: Every day | ORAL | Status: DC
  Administered 2014-11-24 – 2014-11-27 (×4): 40 mg via ORAL
  Filled 2014-11-23 (×3): qty 1

## 2014-11-23 MED ORDER — VANCOMYCIN HCL IN DEXTROSE 1-5 GM/200ML-% IV SOLN
1000.0000 mg | Freq: Once | INTRAVENOUS | Status: AC
  Administered 2014-11-23: 1000 mg via INTRAVENOUS

## 2014-11-23 MED ORDER — VANCOMYCIN HCL IN DEXTROSE 1-5 GM/200ML-% IV SOLN
INTRAVENOUS | Status: AC
  Administered 2014-11-23: 1000 mg via INTRAVENOUS
  Filled 2014-11-23: qty 200

## 2014-11-23 MED ORDER — SALINE SPRAY 0.65 % NA SOLN
1.0000 | NASAL | Status: DC
  Administered 2014-11-24 – 2014-11-27 (×4): 1 via NASAL
  Filled 2014-11-23: qty 44

## 2014-11-23 MED ORDER — ONDANSETRON HCL 4 MG PO TABS: 4.0000 mg | ORAL_TABLET | Freq: Four times a day (QID) | ORAL | Status: DC | PRN

## 2014-11-23 MED ORDER — ALBUTEROL SULFATE (2.5 MG/3ML) 0.083% IN NEBU: 2.5000 mg | INHALATION_SOLUTION | Freq: Four times a day (QID) | RESPIRATORY_TRACT | Status: DC | PRN

## 2014-11-23 MED ORDER — CALCIUM CARBONATE-VITAMIN D 500-200 MG-UNIT PO TABS
1.0000 | ORAL_TABLET | Freq: Every day | ORAL | Status: DC
  Administered 2014-11-24 – 2014-11-27 (×4): 1 via ORAL
  Filled 2014-11-23 (×8): qty 1

## 2014-11-23 MED ORDER — ENALAPRIL MALEATE 5 MG PO TABS
5.0000 mg | ORAL_TABLET | Freq: Every day | ORAL | Status: DC
  Filled 2014-11-23: qty 1

## 2014-11-23 MED ORDER — ACETAMINOPHEN 325 MG PO TABS: 650.0000 mg | ORAL_TABLET | ORAL | Status: DC | PRN

## 2014-11-23 MED ORDER — PIPERACILLIN-TAZOBACTAM 3.375 G IVPB
INTRAVENOUS | Status: AC
  Administered 2014-11-23: 3.375 g via INTRAVENOUS
  Filled 2014-11-23: qty 50

## 2014-11-23 MED ORDER — ALUM & MAG HYDROXIDE-SIMETH 200-200-20 MG/5ML PO SUSP: 30.0000 mL | Freq: Four times a day (QID) | ORAL | Status: DC | PRN

## 2014-11-23 MED ORDER — HEPARIN SODIUM (PORCINE) 5000 UNIT/ML IJ SOLN
5000.0000 [IU] | Freq: Three times a day (TID) | INTRAMUSCULAR | Status: DC
  Administered 2014-11-23 – 2014-11-27 (×12): 5000 [IU] via SUBCUTANEOUS
  Filled 2014-11-23 (×12): qty 1

## 2014-11-23 MED ORDER — LORATADINE 10 MG PO TABS
10.0000 mg | ORAL_TABLET | Freq: Every day | ORAL | Status: DC
  Administered 2014-11-24 – 2014-11-27 (×4): 10 mg via ORAL
  Filled 2014-11-23 (×4): qty 1

## 2014-11-23 MED ORDER — ACETAMINOPHEN 650 MG RE SUPP: 650.0000 mg | Freq: Four times a day (QID) | RECTAL | Status: DC | PRN

## 2014-11-23 MED ORDER — DOCUSATE SODIUM 100 MG PO CAPS
100.0000 mg | ORAL_CAPSULE | Freq: Two times a day (BID) | ORAL | Status: DC
  Administered 2014-11-23 – 2014-11-27 (×8): 100 mg via ORAL
  Filled 2014-11-23 (×8): qty 1

## 2014-11-23 MED ORDER — ALBUTEROL SULFATE HFA 108 (90 BASE) MCG/ACT IN AERS: 2.0000 | INHALATION_SPRAY | Freq: Three times a day (TID) | RESPIRATORY_TRACT | Status: DC | PRN

## 2014-11-23 MED ORDER — ADULT MULTIVITAMIN W/MINERALS CH
1.0000 | ORAL_TABLET | Freq: Two times a day (BID) | ORAL | Status: DC
  Administered 2014-11-24 – 2014-11-27 (×7): 1 via ORAL
  Filled 2014-11-23 (×7): qty 1

## 2014-11-23 MED ORDER — DONEPEZIL HCL 5 MG PO TABS
5.0000 mg | ORAL_TABLET | Freq: Every day | ORAL | Status: DC
  Administered 2014-11-24 – 2014-11-27 (×4): 5 mg via ORAL
  Filled 2014-11-23 (×4): qty 1

## 2014-11-23 MED ORDER — PIPERACILLIN-TAZOBACTAM 3.375 G IVPB
3.3750 g | Freq: Once | INTRAVENOUS | Status: AC
  Administered 2014-11-23: 3.375 g via INTRAVENOUS

## 2014-11-23 MED ORDER — POLYVINYL ALCOHOL 1.4 % OP SOLN
1.0000 [drp] | OPHTHALMIC | Status: DC | PRN
  Filled 2014-11-23: qty 15

## 2014-11-23 MED ORDER — ERYTHROMYCIN 5 MG/GM OP OINT
1.0000 | TOPICAL_OINTMENT | Freq: Four times a day (QID) | OPHTHALMIC | Status: DC
Start: 2014-11-23 — End: 2014-11-27
  Administered 2014-11-23 – 2014-11-27 (×13): 1 via OPHTHALMIC
  Filled 2014-11-23 (×2): qty 3.5

## 2014-11-23 MED ORDER — CEFTRIAXONE SODIUM IN DEXTROSE 20 MG/ML IV SOLN
1.0000 g | INTRAVENOUS | Status: DC
  Administered 2014-11-23 – 2014-11-26 (×4): 1 g via INTRAVENOUS
  Filled 2014-11-23 (×5): qty 50

## 2014-11-23 MED ORDER — DARIFENACIN HYDROBROMIDE ER 7.5 MG PO TB24
7.5000 mg | ORAL_TABLET | Freq: Every day | ORAL | Status: DC
  Administered 2014-11-24 – 2014-11-27 (×4): 7.5 mg via ORAL
  Filled 2014-11-23 (×5): qty 1

## 2014-11-23 MED ORDER — CITALOPRAM HYDROBROMIDE 20 MG PO TABS
20.0000 mg | ORAL_TABLET | Freq: Two times a day (BID) | ORAL | Status: DC
  Administered 2014-11-23 – 2014-11-27 (×8): 20 mg via ORAL
  Filled 2014-11-23 (×8): qty 1

## 2014-11-23 MED ORDER — ALBUTEROL SULFATE (2.5 MG/3ML) 0.083% IN NEBU: 2.5000 mg | INHALATION_SOLUTION | Freq: Three times a day (TID) | RESPIRATORY_TRACT | Status: DC | PRN

## 2014-11-23 MED ORDER — ASPIRIN EC 81 MG PO TBEC
81.0000 mg | DELAYED_RELEASE_TABLET | Freq: Every day | ORAL | Status: DC
  Administered 2014-11-24 – 2014-11-27 (×4): 81 mg via ORAL
  Filled 2014-11-23 (×4): qty 1

## 2014-11-23 MED ORDER — VITAMIN D3 25 MCG (1000 UNIT) PO TABS
1000.0000 [IU] | ORAL_TABLET | Freq: Every day | ORAL | Status: DC
  Administered 2014-11-24 – 2014-11-27 (×4): 1000 [IU] via ORAL
  Filled 2014-11-23 (×9): qty 1

## 2014-11-23 MED ORDER — OLANZAPINE 2.5 MG PO TABS
2.5000 mg | ORAL_TABLET | Freq: Every day | ORAL | Status: DC
  Administered 2014-11-23 – 2014-11-26 (×4): 2.5 mg via ORAL
  Filled 2014-11-23 (×5): qty 1

## 2014-11-23 MED ORDER — BOOST PLUS PO LIQD: 237.0000 mL | Freq: Three times a day (TID) | ORAL | Status: DC | PRN

## 2014-11-23 MED ORDER — SODIUM CHLORIDE 0.9 % IV SOLN
INTRAVENOUS | Status: DC
  Administered 2014-11-23 – 2014-11-24 (×4): via INTRAVENOUS

## 2014-11-23 MED ORDER — MONTELUKAST SODIUM 10 MG PO TABS
10.0000 mg | ORAL_TABLET | Freq: Every day | ORAL | Status: DC
  Administered 2014-11-24 – 2014-11-27 (×4): 10 mg via ORAL
  Filled 2014-11-23 (×4): qty 1

## 2014-11-23 MED ORDER — LEVOTHYROXINE SODIUM 75 MCG PO TABS
75.0000 ug | ORAL_TABLET | Freq: Every day | ORAL | Status: DC
  Administered 2014-11-24 – 2014-11-27 (×4): 75 ug via ORAL
  Filled 2014-11-23 (×4): qty 1

## 2014-11-23 MED ORDER — HYDROCODONE-ACETAMINOPHEN 5-325 MG PO TABS: 1.0000 | ORAL_TABLET | ORAL | Status: DC | PRN

## 2014-11-23 NOTE — Progress Notes (Signed)
PT Attempt Note  Patient Details Name: Christina Hahn MRN: [id number] DOB: [birth year]   Cancelled Treatment:    Reason Eval/Treat Not Completed: Patient at procedure or test/unavailable. Patient with RN going over admission paperwork. PT will re-attempt as patient is appropriate and available.   Kerman Passey, PT, DPT   11/23/2014, 2:12 PM

## 2014-11-23 NOTE — H&P (Signed)
[facility] [place] NAME: Christina Hahn    MR#:  [id number]  DATE OF BIRTH:  September 13, 1922  DATE OF ADMISSION:  11/23/2014  PRIMARY CARE PHYSICIAN: [name], MD   REQUESTING/REFERRING PHYSICIAN:  [place] CHIEF COMPLAINT:   Chief Complaint  Patient presents with  . Fever    HISTORY OF PRESENT ILLNESS: Christina Hahn  is a 81 y.o. female with a known history of  Dementia, who currently resides in assisted living was brought to the hospital after she was noted to have fever. Her daughters are at the bedside. Patient usually does not complain much but has been having some headache which she contributes to some sinus.  She has not had any urinary complaints however has had a history of recurrent urinary tract infection. Patient has not been eating or drinking much for the past few days. She was brought to the ED with fever and noted to have a urinary tract infection. Patient also has been complaining of right upper quadrant abdominal pain PAST MEDICAL HISTORY:   Past Medical History  Diagnosis Date  . Arthritis   . Hypertension   . GERD (gastroesophageal reflux disease)   . Depression   . Eczema   . Asthma   . Anxiety   . Dementia   . Thyroid disease     PAST SURGICAL HISTORY: History reviewed. No pertinent past surgical history.  SOCIAL HISTORY:  History  Substance Use Topics  . Smoking status: Never Smoker   . Smokeless tobacco: Not on file  . Alcohol Use: No    FAMILY HISTORY: History reviewed. No pertinent family history.  DRUG ALLERGIES:  Allergies  Allergen Reactions  . Antivert [Meclizine]   . Codeine   . Dramamine [Dimenhydrinate]   . Flonase [Fluticasone Propionate]   . Penicillins   . Shrimp [Shellfish Allergy]   . Tape Other (See Comments)    Tears skin  . Voltaren [Diclofenac Sodium]     REVIEW OF SYSTEMS: Not a very reliable historian  CONSTITUTIONAL: Positive fever, fatigue or weakness.  EYES: No blurred  or double vision.  EARS, NOSE, AND THROAT: No tinnitus or ear pain.  RESPIRATORY: No cough, shortness of breath, wheezing or hemoptysis.  CARDIOVASCULAR: No chest pain, orthopnea, edema.  GASTROINTESTINAL: No nausea, vomiting, diarrhea or abdominal pain.  GENITOURINARY: No dysuria, hematuria.  ENDOCRINE: No polyuria, nocturia,  HEMATOLOGY: No anemia, easy bruising or bleeding SKIN: No rash or lesion. MUSCULOSKELETAL: No joint pain or arthritis.   NEUROLOGIC: No tingling, numbness, weakness. Positive headache PSYCHIATRY: No anxiety or depression.   MEDICATIONS AT HOME:  Prior to Admission medications   Medication Sig Start Date End Date Taking? Authorizing Provider  acetaminophen (TYLENOL) 325 MG tablet Take 650 mg by mouth every 4 (four) hours as needed for moderate pain.   Yes Historical Provider, MD  albuterol (PROVENTIL HFA;VENTOLIN HFA) 108 (90 BASE) MCG/ACT inhaler Inhale 2 puffs into the lungs 3 (three) times daily as needed for shortness of breath. Wait 1 minute between puffs.   Yes Historical Provider, MD  albuterol (PROVENTIL) (2.5 MG/3ML) 0.083% nebulizer solution Take 2.5 mg by nebulization every 6 (six) hours as needed for shortness of breath.   Yes Historical Provider, MD  aspirin EC 81 MG tablet Take 81 mg by mouth daily.   Yes Historical Provider, MD  Calcium Carbonate-Vitamin D 600-400 MG-UNIT per tablet Take 1 tablet by mouth daily.   Yes Historical Provider, MD  Chlorpheniramine-Acetaminophen (CORICIDIN HBP  COLD/FLU PO) Take 1 tablet by mouth every 6 (six) hours as needed (for cold symptoms).   Yes Historical Provider, MD  cholecalciferol (VITAMIN D) 1000 UNITS tablet Take 1,000 Units by mouth daily.   Yes Historical Provider, MD  citalopram (CELEXA) 20 MG tablet Take 20 mg by mouth 2 (two) times daily.   Yes Historical Provider, MD  donepezil (ARICEPT) 5 MG tablet Take 5 mg by mouth daily.   Yes Historical Provider, MD  enalapril (VASOTEC) 5 MG tablet Take 5 mg by mouth  daily.   Yes Historical Provider, MD  erythromycin ophthalmic ointment Place 1 application into the left eye 4 (four) times daily. Apply 0.25 inches in left eye four times a day.   Yes Historical Provider, MD  Fluticasone-Salmeterol (ADVAIR) 250-50 MCG/DOSE AEPB Inhale 1 puff into the lungs 2 (two) times daily.   Yes Historical Provider, MD  lactose free nutrition (BOOST) LIQD Take 237 mLs by mouth 3 (three) times daily as needed (when not eating).   Yes Historical Provider, MD  levothyroxine (SYNTHROID, LEVOTHROID) 75 MCG tablet Take 75 mcg by mouth daily before breakfast.   Yes Historical Provider, MD  loratadine (CLARITIN) 10 MG tablet Take 10 mg by mouth daily.   Yes Historical Provider, MD  mineral oil-hydrophilic petrolatum (AQUAPHOR) ointment Apply 1 application topically 4 (four) times daily as needed for dry skin.   Yes Historical Provider, MD  montelukast (SINGULAIR) 10 MG tablet Take 10 mg by mouth daily.   Yes Historical Provider, MD  Multiple Vitamins-Minerals (PRESERVISION AREDS) CAPS Take 1 capsule by mouth 2 (two) times daily.   Yes Historical Provider, MD  OLANZapine (ZYPREXA) 2.5 MG tablet Take 2.5 mg by mouth at bedtime.   Yes Historical Provider, MD  omeprazole (PRILOSEC) 20 MG capsule Take 20 mg by mouth 2 (two) times daily before a meal.   Yes Historical Provider, MD  OXYGEN Inhale 2 L into the lungs. Run at 2L for 17 hours daily due to pulmonary htn and edema.   Yes Historical Provider, MD  Polyethyl Glycol-Propyl Glycol 0.4-0.3 % SOLN Place 1 drop into both eyes as needed (for dry eyes).   Yes Historical Provider, MD  SALINE NASAL SPRAY NA Place 1 spray into both nostrils every morning.   Yes Historical Provider, MD  traMADol (ULTRAM) 50 MG tablet Take 50 mg by mouth every 6 (six) hours as needed for moderate pain.   Yes Historical Provider, MD  trospium (SANCTURA) 20 MG tablet Take 20 mg by mouth daily.   Yes Historical Provider, MD      PHYSICAL EXAMINATION:   VITAL  SIGNS: Blood pressure 88/45, pulse 82, temperature 98.3 F (36.8 C), temperature source Oral, resp. rate 18, height 5\' 5"  (1.651 m), weight 74.021 kg (163 lb 3 oz), SpO2 98 %.  GENERAL:  81 y.o.-year-old patient lying in the bed with no acute distress.  EYES: Pupils equal, round, reactive to light and accommodation. No scleral icterus. Extraocular muscles intact.  HEENT: Head atraumatic, normocephalic. Oropharynx and nasopharynx clear.  NECK:  Supple, no jugular venous distention. No thyroid enlargement, no tenderness.  LUNGS: Normal breath sounds bilaterally, no wheezing, rales,rhonchi or crepitation. No use of accessory muscles of respiration.  CARDIOVASCULAR: S1, S2 normal. No murmurs, rubs, or gallops.  ABDOMEN: Soft, right upper quadrant tenderness, nondistended. Bowel sounds present. No organomegaly or mass.  EXTREMITIES: No pedal edema, cyanosis, or clubbing.  NEUROLOGIC: Cranial nerves II through XII are intact. Muscle strength 5/5 in all extremities. Sensation  intact. Gait not checked.  PSYCHIATRIC: The patient is alert and oriented x 1  SKIN: No obvious rash, lesion, or ulcer.   LABORATORY PANEL:   CBC  Recent Labs Lab 11/23/14 1047 11/23/14 1318  WBC 28.0* 26.0*  HGB 12.0 10.7*  HCT 36.8 32.8*  PLT 137* 117*  MCV 95.5 95.4  MCH 31.2 31.0  MCHC 32.7 32.5  RDW 13.8 13.9  LYMPHSABS 1.4  --   MONOABS 0.6  --   EOSABS 0.0  --   BASOSABS 0.0  --    ------------------------------------------------------------------------------------------------------------------  Chemistries   Recent Labs Lab 11/23/14 1047 11/23/14 1318  NA 138  --   K 4.3  --   CL 102  --   CO2 25  --   GLUCOSE 110*  --   BUN 33*  --   CREATININE 2.04* 1.92*  CALCIUM 9.2  --   AST 29 26  ALT 21 16  ALKPHOS 60 52  BILITOT 1.1 1.0   ------------------------------------------------------------------------------------------------------------------ estimated creatinine clearance is 19.6  mL/min (by C-G formula based on Cr of 1.92). ------------------------------------------------------------------------------------------------------------------ No results for input(s): TSH, T4TOTAL, T3FREE, THYROIDAB in the last 72 hours.  Invalid input(s): FREET3   Coagulation profile No results for input(s): INR, PROTIME in the last 168 hours. ------------------------------------------------------------------------------------------------------------------- No results for input(s): DDIMER in the last 72 hours. -------------------------------------------------------------------------------------------------------------------  Cardiac Enzymes No results for input(s): CKMB, TROPONINI, MYOGLOBIN in the last 168 hours.  Invalid input(s): CK ------------------------------------------------------------------------------------------------------------------ Invalid input(s): POCBNP  ---------------------------------------------------------------------------------------------------------------  Urinalysis    Component Value Date/Time   COLORURINE AMBER* 11/23/2014 1047   APPEARANCEUR CLOUDY* 11/23/2014 1047   LABSPEC 1.023 11/23/2014 1047   PHURINE 5.0 11/23/2014 1047   GLUCOSEU NEGATIVE 11/23/2014 1047   HGBUR NEGATIVE 11/23/2014 [place] 11/23/2014 1047   KETONESUR TRACE* 11/23/2014 1047   PROTEINUR 100* 11/23/2014 1047   NITRITE NEGATIVE 11/23/2014 1047   LEUKOCYTESUR 2+* 11/23/2014 1047     RADIOLOGY: Dg Chest Port 1 View  11/23/2014   CLINICAL DATA:  Fever and weakness.  EXAM: PORTABLE CHEST - 1 VIEW  COMPARISON:  10/20/2013  FINDINGS: Density at the left lung base is suspicious for pneumonia. There is mild atelectasis at the right lung base. No edema or pleural fluid identified. The heart size is normal. There is stable tortuosity of the thoracic aorta.  IMPRESSION: Increased density at the left lung base suspicious for pneumonia.   Electronically Signed   By:  Aletta Edouard M.D.   On: 11/23/2014 11:41    EKG: No orders found for this or any previous visit.  IMPRESSION AND PLAN: Patient is a 81 year old brought in to the hospital with fever 1.  Fever: Due to urinary tract infection present on admission, urine culture has been sent she has received Zosyn and vancomycin and emergency room. I will place her on ceftriaxone and follow her urine cultures treat accordingly  2. Headache; possible sinus in nature continue Claritin, add Flonase  3. Asthma continue nebulizers when necessary, also on Dulera  4. Depression: Continue Celexa  5. Right upper quadrant abdominal pain: Check LFTs lipase  6. CODE STATUS: DO NOT RESUSCITATE   All the records are reviewed and case discussed with ED provider. Management plans discussed with the patient, family and they are in agreement.  CODE STATUS:    Code Status Orders        Start     Ordered   11/23/14 1200  Do not attempt resuscitation (DNR)   Continuous  Question Answer Comment  In the event of cardiac or respiratory ARREST Do not call a "code blue"   In the event of cardiac or respiratory ARREST Do not perform Intubation, CPR, defibrillation or ACLS   In the event of cardiac or respiratory ARREST Use medication by any route, position, wound care, and other measures to relive pain and suffering. May use oxygen, suction and manual treatment of airway obstruction as needed for comfort.      11/23/14 1202    Advance Directive Documentation        Most Recent Value   Type of Advance Directive  Out of facility DNR (pink MOST or yellow form)   Pre-existing out of facility DNR order (yellow form or pink MOST form)     "MOST" Form in Place?         TOTAL TIME TAKING CARE OF THIS PATIENT: 55 minutes.    Dustin Flock M.D on 11/23/2014 at 3:21 PM  Between 7am to 6pm - Pager - [phone]  After 6pm go to [email or web address] - password [email or web address] [facility]  Office   [phone]  CC: Primary care physician; [name], MD

## 2014-11-23 NOTE — Plan of Care (Signed)
Problem: Discharge Progression Outcomes Goal: Discharge plan in place and appropriate Individualiation Likes to be called [place] Lives in [facility] assisted [facility] Hx HTN, GERD, depression, arthritis, eczema, asthma, anxiety, dementia, thyroid disease    Goal: Other Discharge Outcomes/Goals No c/o pain High fall risk- bed alarm on, pt educated to use call bell or call. O2 on no c/o shortness of breath

## 2014-11-23 NOTE — Evaluation (Signed)
Physical Therapy Evaluation Patient Details Name: Christina Hahn MRN: [id number] DOB: Jun 14, 1923 Today's Date: 11/23/2014   History of Present Illness  Patient is a 81 y/o female that presents from ALF at [facility], where she was found to have a fever that started last night. Patient was found to have a UTI and sepsis. She has a history of Alzheimer's and requires 2L of O2 at baseline.   Clinical Impression  Patient is a 81 y/o female that presents with decreased mobility and poor cognitive skills limiting her ability to follow directions and safely ambulate. Patient is able to transfer supine to sit with minimal assistance, and requires minimal assistance to sit on the side of the bed initially. After roughly 30 seconds she was able to hold herself up independently. Patient was able to transfer sit <--> stand multiple times with RW with min A x1, however she did not take any steps with the RW though she was clearly capable as she was either confused by her new surroundings or did not trust the walker per her family. PT attempted multiple sit to stands and walking attempts, and continued to display similar ambulation status. PT requested the family bring in her rollator tomorrow so she can have her "normal" device which may assist with ambulation. Skilled PT services are indicated at this time to address the above deficits.     Follow Up Recommendations SNF    Equipment Recommendations       Recommendations for Other Services       Precautions / Restrictions Precautions Precautions: Fall Restrictions Weight Bearing Restrictions: No Other Position/Activity Restrictions: Per the family the patient broke her right wrist roughly a year ago and state she has no WB restrictions on it. Then they instruct patient not to push into the bed with her right hand while she is transferring out of bed?       Mobility  Bed Mobility Overal bed mobility: Needs Assistance Bed Mobility: Supine to Sit;Sit to  Supine     Supine to sit: Min assist Sit to supine: Mod assist   General bed mobility comments: Patient required instructions for directions and how to complete supine <--> sit transfers secondary to poor motor planning skills.   Transfers Overall transfer level: Needs assistance Equipment used: Rolling walker (2 wheeled) Transfers: Sit to/from Stand Sit to Stand: Min assist         General transfer comment: Patient requires cuing for safe hand placement on the walker.   Ambulation/Gait             General Gait Details: Patient does not take any steps, though she stands well and transfer sit <--> stand well. It seems she may be confused by her new surroundings and a 2 wheeled walker rather than her rollator.   Stairs            Wheelchair Mobility    Modified Rankin (Stroke Patients Only)       Balance Overall balance assessment: Needs assistance Sitting-balance support: Feet supported Sitting balance-Leahy Scale: Fair   Postural control: Posterior lean Standing balance support: Bilateral upper extremity supported Standing balance-Leahy Scale: Fair                               Pertinent Vitals/Pain Pain Assessment:  (Patient does not state she has any pain)    Home Living Family/patient expects to be discharged to:: Skilled nursing facility  Prior Function Level of Independence: Needs assistance   Gait / Transfers Assistance Needed: Per the family she was using a walker to move throughout her room.   ADL's / Homemaking Assistance Needed: Patient requires assistance for some ADLs, likely primarily for cognitive reasons. Per the family she has been fairly independent with mobility.         Hand Dominance        Extremity/Trunk Assessment   Upper Extremity Assessment: Difficult to assess due to impaired cognition           Lower Extremity Assessment: Difficult to assess due to impaired cognition       Cervical / Trunk Assessment: Kyphotic  Communication   Communication: No difficulties  Cognition Arousal/Alertness: Awake/alert Behavior During Therapy: WFL for tasks assessed/performed;Impulsive Overall Cognitive Status: History of cognitive impairments - at baseline       Memory: Decreased recall of precautions;Decreased short-term memory              General Comments General comments (skin integrity, edema, etc.): Some evidence of decreased skin integrity on her LEs    Exercises        Assessment/Plan    PT Assessment Patient needs continued PT services  PT Diagnosis Difficulty walking   PT Problem List Decreased strength;Decreased balance;Decreased cognition;Decreased knowledge of precautions;Decreased mobility;Decreased safety awareness;Decreased activity tolerance  PT Treatment Interventions DME instruction;Therapeutic activities;Gait training;Therapeutic exercise   PT Goals (Current goals can be found in the Care Plan section) Acute Rehab PT Goals Patient Stated Goal: Family's goal is to maximize her level of mobility.  PT Goal Formulation: With patient/family Time For Goal Achievement: 12/07/14 Potential to Achieve Goals: Fair    Frequency Min 2X/week   Barriers to discharge        Co-evaluation               End of Session Equipment Utilized During Treatment: Gait belt Activity Tolerance: Patient tolerated treatment well (patient limited by ability to follow directions) Patient left: in bed;with bed alarm set;with call bell/phone within reach;with family/visitor present Nurse Communication: Mobility status         Time: 6712-4580 PT Time Calculation (min) (ACUTE ONLY): 20 min   Charges:   PT Evaluation $Initial PT Evaluation Tier I: 1 Procedure     PT G Codes:       Kerman Passey, PT, DPT   11/23/2014, 5:06 PM

## 2014-11-23 NOTE — ED Notes (Signed)
Pt her from [facility], here with c/o fever since yesterday and weakness. EMS reports pt wears 2L of oxygen from while awake during the day.

## 2014-11-23 NOTE — ED Provider Notes (Signed)
[facility] Emergency Department Provider Note  ____________________________________________  Time seen: 10:30 AM  I have reviewed the triage vital signs and the nursing notes.   HISTORY  Chief Complaint Fever   Patient with Alzheimer's disease   HPI Christina Hahn is a 81 y.o. female who presents with fever that started last night. She is from [facility]. She had a temperature that started last night and continued this morning. Her mental status has not changed per daughter. Daughter does report history of urinary tract infections in the past that been severe. Patient denies cough or shortness of breath. Does complain of mild lower abdominal discomfort especially when I press there.     Past Medical History  Diagnosis Date  . Arthritis   . Hypertension   . GERD (gastroesophageal reflux disease)   . Depression   . Eczema   . Asthma   . Anxiety   . Dementia   . Thyroid disease     There are no active problems to display for this patient.   Appendectomy and hysterectomy No current outpatient prescriptions on file.  Allergies Antivert; Codeine; Dramamine; Flonase; Penicillins; Shrimp; and Voltaren  No family history on file.  Social History History  Substance Use Topics  . Smoking status: Never Smoker   . Smokeless tobacco: Not on file  . Alcohol Use: No    Review of Systems  Constitutional: Negative for fever. Eyes: Negative for visual changes. ENT: Negative for sore throat Cardiovascular: Negative for chest pain. Respiratory: Negative for shortness of breath. Gastrointestinal: Negative for abdominal pain, vomiting and diarrhea. Genitourinary: Negative for dysuria. Musculoskeletal: Negative for back pain. Skin: Negative for rash. Neurological: Negative for headaches or focal weakness   10-point ROS otherwise negative. Limited secondary to Alzheimer's  dementia  ____________________________________________   PHYSICAL EXAM:  VITAL SIGNS: ED Triage Vitals  Enc Vitals Group     BP 11/23/14 1012 99/45 mmHg     Pulse Rate 11/23/14 1012 89     Resp 11/23/14 1012 19     Temp 11/23/14 1012 98.7 F (37.1 C)     Temp Source 11/23/14 1012 Oral     SpO2 11/23/14 1012 92 %     Weight 11/23/14 1012 150 lb (68.04 kg)     Height 11/23/14 1012 5\' 7"  (1.702 m)     Head Cir --      Peak Flow --      Pain Score 11/23/14 1014 0     Pain Loc --      Pain Edu? --      Excl. in [place]? --      Constitutional: Alert and oriented. Well appearing and in no distress. Eyes: Conjunctivae are normal. PERRL. ENT   Head: Normocephalic and atraumatic.   Nose: No rhinnorhea.   Mouth/Throat: Mucous membranes are moist. Cardiovascular: Normal rate, regular rhythm. Normal and symmetric distal pulses are present in all extremities. No murmurs, rubs, or gallops. Respiratory: Normal respiratory effort without tachypnea nor retractions. Breath sounds are clear and equal bilaterally.  Gastrointestinal: Mild tenderness to palpation supra pubically. No distention. There is no CVA tenderness. Genitourinary: deferred Musculoskeletal: Nontender with normal range of motion in all extremities. No lower extremity tenderness nor edema. Neurologic:  Normal speech and language. No gross focal neurologic deficits are appreciated. Skin:  Skin is warm, dry and intact. No rash noted.   ____________________________________________    LABS (pertinent positives/negatives)  Labs Reviewed  CULTURE, BLOOD (ROUTINE X 2)  CULTURE, BLOOD (ROUTINE X 2)  URINE CULTURE  LACTIC ACID, PLASMA  LACTIC ACID, PLASMA  COMPREHENSIVE METABOLIC PANEL  CBC WITH DIFFERENTIAL/PLATELET  URINALYSIS COMPLETEWITH MICROSCOPIC ([facility] ONLY)    ____________________________________________   EKG  ED ECG REPORT I, Lavonia Drafts, the attending physician, personally viewed and  interpreted this ECG.   Date: 11/23/2014  EKG Time: 10:21 AM  Rate: 87  Rhythm: normal sinus rhythm  Axis: Left axis deviation  Intervals:nonspecific intraventricular conduction delay  ST&T Change: None   ____________________________________________    RADIOLOGY  Chest x-ray unremarkable  ____________________________________________   PROCEDURES  Procedure(s) performed: none  Critical Care performed: none  ____________________________________________   INITIAL IMPRESSION / ASSESSMENT AND PLAN / ED COURSE  Pertinent labs & imaging results that were available during my care of the patient were reviewed by me and considered in my medical decision making (see chart for details).  Patient with fever and history of urinary tract infections. Blood pressure is borderline at this time 99 over 45. We will send blood cultures and lactic acid urine and urine cultures to evaluate for possible sepsis  ----------------------------------------- 11:22 AM on 11/23/2014 -----------------------------------------  Patient with white blood cell count of 28,000. We will start empiric antibiotics IV. ____________________________________________ Patient demented to hospitalist service  FINAL CLINICAL IMPRESSION(S) / ED DIAGNOSES  Final diagnoses:  Sepsis, due to unspecified organism  UTI (lower urinary tract infection)     Lavonia Drafts, MD 11/23/14 [phone]

## 2014-11-24 DIAGNOSIS — N179 Acute kidney failure, unspecified: Secondary | ICD-10-CM | POA: Diagnosis present

## 2014-11-24 DIAGNOSIS — R7881 Bacteremia: Secondary | ICD-10-CM | POA: Diagnosis not present

## 2014-11-24 DIAGNOSIS — N390 Urinary tract infection, site not specified: Secondary | ICD-10-CM | POA: Diagnosis present

## 2014-11-24 LAB — COMPREHENSIVE METABOLIC PANEL
ALBUMIN: 2.7 g/dL — AB (ref 3.5–5.0)
ALK PHOS: 53 U/L (ref 38–126)
ALT: 16 U/L (ref 14–54)
AST: 18 U/L (ref 15–41)
Anion gap: 6 (ref 5–15)
BILIRUBIN TOTAL: 0.6 mg/dL (ref 0.3–1.2)
BUN: 31 mg/dL — AB (ref 6–20)
CHLORIDE: 106 mmol/L (ref 101–111)
CO2: 24 mmol/L (ref 22–32)
CREATININE: 1.69 mg/dL — AB (ref 0.44–1.00)
Calcium: 8.2 mg/dL — ABNORMAL LOW (ref 8.9–10.3)
GFR calc Af Amer: 30 mL/min — ABNORMAL LOW (ref >60)
GFR, EST NON AFRICAN AMERICAN: 26 mL/min — AB (ref >60)
Glucose, Bld: 96 mg/dL (ref 65–99)
POTASSIUM: 3.9 mmol/L (ref 3.5–5.1)
Sodium: 136 mmol/L (ref 135–145)
Total Protein: 5.1 g/dL — ABNORMAL LOW (ref 6.5–8.1)

## 2014-11-24 LAB — GLUCOSE, CAPILLARY: GLUCOSE-CAPILLARY: 114 mg/dL — AB (ref 65–99)

## 2014-11-24 MED ORDER — VANCOMYCIN HCL IN DEXTROSE 750-5 MG/150ML-% IV SOLN
750.0000 mg | INTRAVENOUS | Status: DC
  Administered 2014-11-24 – 2014-11-25 (×2): 750 mg via INTRAVENOUS
  Filled 2014-11-24 (×3): qty 150

## 2014-11-24 MED ORDER — SODIUM CHLORIDE 0.9 % IV BOLUS (SEPSIS)
500.0000 mL | Freq: Once | INTRAVENOUS | Status: AC
  Administered 2014-11-24: 500 mL via INTRAVENOUS

## 2014-11-24 NOTE — Progress Notes (Signed)
[place] [facility] [place] NAME: Christina Hahn    MR#:  [id number]  DATE OF BIRTH:  [birth year]  SUBJECTIVE:  CHIEF COMPLAINT:   Chief Complaint  Patient presents with  . Fever   Better. Afebrile now. Family at bedside. Patient has no concerns REVIEW OF SYSTEMS:    Review of Systems  Constitutional: Positive for weight loss. Negative for fever and chills.  HENT: Negative for sore throat.   Eyes: Negative for blurred vision, double vision and pain.  Respiratory: Negative for cough, hemoptysis, shortness of breath and wheezing.   Cardiovascular: Negative for chest pain, palpitations, orthopnea and leg swelling.  Gastrointestinal: Positive for nausea and abdominal pain. Negative for heartburn, vomiting, diarrhea and constipation.  Genitourinary: Negative for dysuria and hematuria.  Musculoskeletal: Positive for back pain. Negative for joint pain.  Skin: Negative for rash.  Neurological: Positive for weakness. Negative for sensory change, speech change, focal weakness and headaches.  Endo/Heme/Allergies: Does not bruise/bleed easily.  Psychiatric/Behavioral: Positive for memory loss. Negative for depression. The patient is not nervous/anxious.       DRUG ALLERGIES:   Allergies  Allergen Reactions  . Antivert [Meclizine]   . Codeine   . Dramamine [Dimenhydrinate]   . Flonase [Fluticasone Propionate]   . Penicillins   . Shrimp [Shellfish Allergy]   . Tape Other (See Comments)    Tears skin  . Voltaren [Diclofenac Sodium]     VITALS:  Blood pressure 106/45, pulse 88, temperature 99 F (37.2 C), temperature source Oral, resp. rate 20, height 5\' 5"  (1.651 m), weight 74.021 kg (163 lb 3 oz), SpO2 100 %.  PHYSICAL EXAMINATION:   Physical Exam  GENERAL:  81 y.o.-year-old patient lying in the bed with no acute distress.  EYES: Pupils equal, round, reactive to light and accommodation. No scleral icterus. Extraocular muscles intact.   HEENT: Head atraumatic, normocephalic. Oropharynx and nasopharynx clear. Dry NECK:  Supple, no jugular venous distention. No thyroid enlargement, no tenderness.  LUNGS: Normal breath sounds bilaterally, no wheezing, rales, rhonchi. No use of accessory muscles of respiration.  CARDIOVASCULAR: S1, S2 normal. No murmurs, rubs, or gallops.  ABDOMEN: Soft, nontender, nondistended. Bowel sounds present. No organomegaly or mass. Suprapubic tenderness. EXTREMITIES: No cyanosis, clubbing or edema b/l. Right wrist brace. NEUROLOGIC: Cranial nerves II through XII are intact. No focal Motor or sensory deficits b/l.   PSYCHIATRIC: The patient is alert and awake. SKIN: No obvious rash, lesion, or ulcer.    LABORATORY PANEL:   CBC  Recent Labs Lab 11/23/14 1318  WBC 26.0*  HGB 10.7*  HCT 32.8*  PLT 117*   ------------------------------------------------------------------------------------------------------------------  Chemistries   Recent Labs Lab 11/24/14 0601  NA 136  K 3.9  CL 106  CO2 24  GLUCOSE 96  BUN 31*  CREATININE 1.69*  CALCIUM 8.2*  AST 18  ALT 16  ALKPHOS 53  BILITOT 0.6   ------------------------------------------------------------------------------------------------------------------  Cardiac Enzymes No results for input(s): TROPONINI in the last 168 hours. ------------------------------------------------------------------------------------------------------------------  RADIOLOGY:  Dg Chest Port 1 View  11/23/2014   CLINICAL DATA:  Fever and weakness.  EXAM: PORTABLE CHEST - 1 VIEW  COMPARISON:  10/20/2013  FINDINGS: Density at the left lung base is suspicious for pneumonia. There is mild atelectasis at the right lung base. No edema or pleural fluid identified. The heart size is normal. There is stable tortuosity of the thoracic aorta.  IMPRESSION: Increased density at the left lung base suspicious for pneumonia.  Electronically Signed   By: Aletta Edouard M.D.    On: 11/23/2014 11:41     ASSESSMENT AND PLAN:   Patient is a 81 year old with HTN, Dementia  brought in to the hospital with fever  * Bacteremia GPC chains. Start vancomycin while waiting for final cx. Depending on final ID may need to consult ID  * UTI with Severe sepsis - POA On IVF. Abx. Ceftriaxone Await cx results.   * ARF due to ATN - POA On IVF and improving slowly. Monitor I/Os  * Asthma continue nebulizers when necessary, also on Dulera  * Depression: Continue Celexa  All the records are reviewed and case discussed with Care Management/Social Workerr. Management plans discussed with the patient, family and they are in agreement.  CODE STATUS: DNR  DVT Prophylaxis: SCDs  TOTAL TIME TAKING CARE OF THIS PATIENT: 35 minutes.   POSSIBLE D/C IN 2-3 DAYS, DEPENDING ON CLINICAL CONDITION.   Hillary Bow R M.D on 11/24/2014 at 11:34 AM  Between 7am to 6pm - Pager - [phone]  After 6pm go to [email or web address] - password [email or web address] [place] [facility]  Office  [phone]  CC: Primary care physician; [name], MD

## 2014-11-24 NOTE — Progress Notes (Signed)
Spoke to Dr. Darvin Neighbours regarding positive aerobic bottle and gram positive smear cocci in chain. MD aware. Madlyn Frankel, RN

## 2014-11-24 NOTE — Progress Notes (Signed)
Physical Therapy Treatment Patient Details Name: Christina Hahn MRN: [id number] DOB: 12-10-1922 Today's Date: 11/24/2014    History of Present Illness Patient is a 81 y/o female that presents from ALF at [facility], where she was found to have a fever that started last night. Patient was found to have a UTI and sepsis. She has a history of Alzheimer's and requires 2L of O2 at baseline.     PT Comments    Pt unsafe at this time with her rollator. Continue to progress strength and balance and ambulation with a rolling walker.   Follow Up Recommendations  SNF     Equipment Recommendations  Rolling walker with 5" wheels    Recommendations for Other Services       Precautions / Restrictions Restrictions Weight Bearing Restrictions: No    Mobility  Bed Mobility Overal bed mobility: Needs Assistance Bed Mobility: Supine to Sit;Sit to Supine     Supine to sit: Min assist Sit to supine: Min assist   General bed mobility comments: Initially slow to follow commands for supine to sit and scooting to edge of bed, but improved ability after several cues on how to perform   Transfers Overall transfer level: Needs assistance Equipment used: Rolling walker (2 wheeled) Transfers: Sit to/from Stand Sit to Stand: Min assist         General transfer comment: Initially tried rollator at family request, but pt unable to demonstrate safety with locking rollator, unable to steady self well statically and at risk of falling/LOB with attempted ambulation. Much improved with 2 wheeled rolling walker. Discussed with family who will take rollator back home.   Ambulation/Gait Ambulation/Gait assistance: Min assist Ambulation Distance (Feet): 18 Feet Assistive device: Rolling walker (2 wheeled) (Family requested attempt with pt's rollator; unsafe ) Gait Pattern/deviations: Step-through pattern;Decreased dorsiflexion - left;Decreased dorsiflexion - right;Wide base of support;Trunk flexed Gait  velocity: Reduced Gait velocity interpretation: <1.8 ft/sec, indicative of risk for recurrent falls General Gait Details: Pt unsafe unsteady with rollator demonstrating significant LOB/unsteadiness. Improved with rw requiring Min A and mild unsteadiness, but no overt LOB   Stairs            Wheelchair Mobility    Modified Rankin (Stroke Patients Only)       Balance Overall balance assessment: Needs assistance         Standing balance support: Bilateral upper extremity supported Standing balance-Leahy Scale: Poor                      Cognition Arousal/Alertness: Lethargic (Awakens well through voice) Behavior During Therapy: WFL for tasks assessed/performed;Impulsive (Fatigued) Overall Cognitive Status: History of cognitive impairments - at baseline                      Exercises General Exercises - Lower Extremity Ankle Circles/Pumps: AROM;Both;20 reps;Supine Quad Sets: Strengthening;Both;10 reps;Supine Short Arc Quad: AAROM;Right;20 reps;Supine (L AROM) Heel Slides: AAROM;Both;20 reps;Supine Hip ABduction/ADduction: AAROM;Both;20 reps;Supine Straight Leg Raises: AAROM;Both;10 reps;Supine    General Comments        Pertinent Vitals/Pain Pain Assessment: No/denies pain    Home Living                      Prior Function            PT Goals (current goals can now be found in the care plan section) Progress towards PT goals: Progressing toward goals    Frequency  Min 2X/week  PT Plan Current plan remains appropriate    Co-evaluation             End of Session Equipment Utilized During Treatment: Gait belt Activity Tolerance: Patient tolerated treatment well;Patient limited by fatigue Patient left: in bed;with call bell/phone within reach;with bed alarm set;with family/visitor present     Time: 5300-5110 PT Time Calculation (min) (ACUTE ONLY): 32 min  Charges:  $Gait Training: 8-22 mins $Therapeutic Exercise:  8-22 mins                    G Codes:      Charlaine Dalton 11/24/2014, 2:57 PM

## 2014-11-24 NOTE — Plan of Care (Signed)
Problem: Discharge Progression Outcomes Goal: Other Discharge Outcomes/Goals Plan of care progress to goal:  Patient is HOH. No complaints of pain and no signs/symptoms of pain noted. Patient on 2L-O2, sat 100%. Incontinent of urine. IVF infusing as ordered, as well as IV antibiotics. Vancomycin initiated today due to positive blood cultures. Family at bedside.

## 2014-11-24 NOTE — Plan of Care (Signed)
Problem: Discharge Progression Outcomes Goal: Discharge plan in place and appropriate Individualiation  Outcome: Progressing Likes to be called Leonela Lives in [facility] assisted [facility] Hx HTN, GERD, depression, arthritis, eczema, asthma, anxiety, dementia, thyroid disease     Goal: Other Discharge Outcomes/Goals Outcome: Progressing Pt lethargic most of shift, pt's hoh makes for a barrier between pt and staff. Took meds fine, no complaints of pain or distress, 3L [place] O2 sats 98%.

## 2014-11-24 NOTE — Progress Notes (Signed)
Blood cultures came back positive, MD made aware.

## 2014-11-24 NOTE — Progress Notes (Signed)
ANTIBIOTIC CONSULT NOTE - INITIAL  Pharmacy Consult for Vancomycin Indication: bacteremia  Allergies  Allergen Reactions  . Antivert [Meclizine]   . Codeine   . Dramamine [Dimenhydrinate]   . Flonase [Fluticasone Propionate]   . Penicillins   . Shrimp [Shellfish Allergy]   . Tape Other (See Comments)    Tears skin  . Voltaren [Diclofenac Sodium]     Patient Measurements: Height: 5\' 5"  (165.1 cm) Weight: 163 lb 3 oz (74.021 kg) IBW/kg (Calculated) : 57 Adjusted Body Weight: 63.8 kg  Vital Signs: Temp: 99 F (37.2 C) (06/07 0553) Temp Source: Oral (06/07 0553) BP: 106/45 mmHg (06/07 0651) Pulse Rate: 88 (06/07 0651) Intake/Output from previous day: 06/06 0701 - 06/07 0700 In: 240 [P.O.:240] Out: -  Intake/Output from this shift: Total I/O In: 572.5 [P.O.:240; I.V.:332.5] Out: -   Labs:  Recent Labs  11/23/14 1047 11/23/14 1318 11/24/14 0601  WBC 28.0* 26.0*  --   HGB 12.0 10.7*  --   PLT 137* 117*  --   CREATININE 2.04* 1.92* 1.69*   Estimated Creatinine Clearance: 22.3 mL/min (by C-G formula based on Cr of 1.69).   Microbiology: Recent Results (from the past 720 hour(s))  Blood Culture (routine x 2)     Status: None (Preliminary result)   Collection Time: 11/23/14 10:43 AM  Result Value Ref Range Status   Specimen Description BLOOD  Final   Special Requests NONE  Final   Culture  Setup Time   Final    GRAM POSITIVE COCCI IN CHAINS AEROBIC BOTTLE ONLY CRITICAL RESULT CALLED TO, READ BACK BY AND VERIFIED WITH: SHEA BREWER  AT 1134 CTJ    Culture   Final    GRAM POSITIVE COCCI IN CHAINS AEROBIC BOTTLE ONLY IDENTIFICATION TO FOLLOW    Report Status PENDING  Incomplete  Blood Culture (routine x 2)     Status: None (Preliminary result)   Collection Time: 11/23/14 10:46 AM  Result Value Ref Range Status   Specimen Description BLOOD  Final   Special Requests NONE  Final   Culture   Final    GRAM POSITIVE COCCI IN CHAINS IN BOTH AEROBIC AND  ANAEROBIC BOTTLES CRITICAL RESULT CALLED TO, READ BACK BY AND VERIFIED WITH: MEGAN OAKLEY @0340  11/24/14 BY AJO CONFIRMED BY MPG    Report Status PENDING  Incomplete  Urine culture     Status: None (Preliminary result)   Collection Time: 11/23/14 10:47 AM  Result Value Ref Range Status   Specimen Description URINE, CATHETERIZED  Final   Special Requests NONE  Final   Culture NO GROWTH < 24 HOURS  Final   Report Status PENDING  Incomplete    Medical History: Past Medical History  Diagnosis Date  . Arthritis   . Hypertension   . GERD (gastroesophageal reflux disease)   . Depression   . Eczema   . Asthma   . Anxiety   . Dementia   . Thyroid disease     Medications:  Scheduled:  . aspirin EC  81 mg Oral Daily  . calcium-vitamin D  1 tablet Oral Daily  . cefTRIAXone (ROCEPHIN)  IV  1 g Intravenous Q24H  . cholecalciferol  1,000 Units Oral Daily  . citalopram  20 mg Oral BID  . darifenacin  7.5 mg Oral Daily  . docusate sodium  100 mg Oral BID  . donepezil  5 mg Oral Daily  . enalapril  5 mg Oral Daily  . erythromycin  1 application Left Eye  QID  . heparin  5,000 Units Subcutaneous 3 times per day  . levothyroxine  75 mcg Oral QAC breakfast  . loratadine  10 mg Oral Daily  . mometasone-formoterol  2 puff Inhalation BID  . montelukast  10 mg Oral Daily  . multivitamin with minerals  1 tablet Oral BID  . OLANZapine  2.5 mg Oral QHS  . pantoprazole  40 mg Oral Daily  . sodium chloride  1 spray Each Nare BH-q7a  . vancomycin  750 mg Intravenous Q24H   Assessment: Patient is a 81 yo female admitted for fever and receiving treatment with Ceftriaxone for UTI.  Patient now growing GPC x 2 in blood cultures.   SCr: 1.69, est CrCl~22.3 mL/min, ke: 0.023, t1/2: 30.1 h, Vd: 44.7 L  Goal of Therapy:  Vancomycin trough level 15-20 mcg/ml  Plan:  Patient received Vancomycin 1 gm IV in ED on 6/6.  Will start patient on Vancomycin 750 mg IV q24h for goal trough of 15-20.  Will draw  trough prior to 4th dose (not at steady state) for monitoring purposes.   Measure antibiotic drug levels at steady state Follow up culture results  Pharmacy will continue to follow.  Murrell Converse, PharmD Clinical Pharmacist 11/24/2014

## 2014-11-24 NOTE — Care Management (Signed)
Admitted to [facility] with the diagnosis of fever. Lives at [facility] since 2012. Daughter is Evalina Field [phone] or [phone]). Care [place] in the past for physical therapy. No skilled facility. Sees Dr. Humphrey Rolls. [facility] takes her for physician's appointments. Uses a rolling walker to aid in ambulation. Good appetite prior to this admission. Home oxygen. Unsure as to how long she has had home oxygen.  Shelbie Ammons RN MSN Care management [phone]

## 2014-11-25 LAB — CBC
HCT: 30.8 % — ABNORMAL LOW (ref 35.0–47.0)
HEMOGLOBIN: 10.4 g/dL — AB (ref 12.0–16.0)
MCH: 32.1 pg (ref 26.0–34.0)
MCHC: 33.6 g/dL (ref 32.0–36.0)
MCV: 95.5 fL (ref 80.0–100.0)
PLATELETS: 112 10*3/uL — AB (ref 150–440)
RBC: 3.23 MIL/uL — ABNORMAL LOW (ref 3.80–5.20)
RDW: 14.2 % (ref 11.5–14.5)
WBC: 8.5 10*3/uL (ref 3.6–11.0)

## 2014-11-25 LAB — BASIC METABOLIC PANEL
ANION GAP: 5 (ref 5–15)
BUN: 29 mg/dL — AB (ref 6–20)
CO2: 25 mmol/L (ref 22–32)
Calcium: 8.3 mg/dL — ABNORMAL LOW (ref 8.9–10.3)
Chloride: 111 mmol/L (ref 101–111)
Creatinine, Ser: 1.43 mg/dL — ABNORMAL HIGH (ref 0.44–1.00)
GFR, EST AFRICAN AMERICAN: 36 mL/min — AB (ref >60)
GFR, EST NON AFRICAN AMERICAN: 31 mL/min — AB (ref >60)
GLUCOSE: 100 mg/dL — AB (ref 65–99)
Potassium: 4.1 mmol/L (ref 3.5–5.1)
SODIUM: 141 mmol/L (ref 135–145)

## 2014-11-25 NOTE — Consult Note (Signed)
[facility] Infectious Disease     Reason for Consult: Bacteremia, UTI    Referring Physician: Sudini Date of Admission:  11/23/2014   Active Problems:   Fever   Bacteremia   Acute renal failure   UTI (urinary tract infection)  HPI: Christina Hahn is a 81 y.o. female with a known history of Dementia, who resides in assisted living and was admitted 6/6  after she was noted to have fever. Patient had not been eating or drinking much for the past few days. She was brought to the ED with fever and noted to have a urinary tract infection. Patient also has been complaining of right upper quadrant abdominal pain. Has been admitted and has + UCX with E coli and BCx with Grp A strep.  Past Medical History  Diagnosis Date  . Arthritis   . Hypertension   . GERD (gastroesophageal reflux disease)   . Depression   . Eczema   . Asthma   . Anxiety   . Dementia   . Thyroid disease    History reviewed. No pertinent past surgical history. History  Substance Use Topics  . Smoking status: Never Smoker   . Smokeless tobacco: Not on file  . Alcohol Use: No   History reviewed. No pertinent family history.  Allergies:  Allergies  Allergen Reactions  . Antivert [Meclizine]   . Codeine   . Dramamine [Dimenhydrinate]   . Flonase [Fluticasone Propionate]   . Penicillins   . Shrimp [Shellfish Allergy]   . Tape Other (See Comments)    Tears skin  . Voltaren [Diclofenac Sodium]     Current antibiotics: Antibiotics Given (last 72 hours)    Date/Time Action Medication Dose Rate   11/23/14 1713 Given   cefTRIAXone (ROCEPHIN) 1 g in dextrose 5 % 50 mL IVPB - Premix 1 g 100 mL/hr   11/24/14 1335 Given   vancomycin (VANCOCIN) IVPB 750 mg/150 ml premix 750 mg 150 mL/hr   11/24/14 1438 Given   cefTRIAXone (ROCEPHIN) 1 g in dextrose 5 % 50 mL IVPB - Premix 1 g 100 mL/hr   11/25/14 1139 Given   vancomycin (VANCOCIN) IVPB 750 mg/150 ml premix 750 mg 150 mL/hr   11/25/14 1254 Given   cefTRIAXone  (ROCEPHIN) 1 g in dextrose 5 % 50 mL IVPB - Premix 1 g 100 mL/hr      MEDICATIONS: . aspirin EC  81 mg Oral Daily  . calcium-vitamin D  1 tablet Oral Daily  . cefTRIAXone (ROCEPHIN)  IV  1 g Intravenous Q24H  . cholecalciferol  1,000 Units Oral Daily  . citalopram  20 mg Oral BID  . darifenacin  7.5 mg Oral Daily  . docusate sodium  100 mg Oral BID  . donepezil  5 mg Oral Daily  . erythromycin  1 application Left Eye QID  . heparin  5,000 Units Subcutaneous 3 times per day  . levothyroxine  75 mcg Oral QAC breakfast  . loratadine  10 mg Oral Daily  . mometasone-formoterol  2 puff Inhalation BID  . montelukast  10 mg Oral Daily  . multivitamin with minerals  1 tablet Oral BID  . OLANZapine  2.5 mg Oral QHS  . pantoprazole  40 mg Oral Daily  . sodium chloride  1 spray Each Nare BH-q7a    Review of Systems - 11 systems reviewed and negative per HPI   OBJECTIVE: Temp:  [97.7 F (36.5 C)-98.5 F (36.9 C)] 97.7 F (36.5 C) (06/08 1509) Pulse  Rate:  [54-71] 54 (06/08 1509) Resp:  [17-20] 18 (06/08 1509) BP: (110-137)/(45-108) 122/49 mmHg (06/08 1509) SpO2:  [97 %-100 %] 100 % (06/08 1509)  GENERAL: 81 y.o.-year-old patient lying in the bed with no acute distress.  EYES: Pupils equal, round, reactive to light and accommodation. No scleral icterus. Extraocular muscles intact.  HEENT: Head atraumatic, normocephalic. Oropharynx and nasopharynx clear.  NECK: Supple, no jugular venous distention. No thyroid enlargement, no tenderness.  LUNGS: Normal breath sounds bilaterally, no wheezing, rales,rhonchi or crepitation. No use of accessory muscles of respiration.  CARDIOVASCULAR: S1, S2 normal. No murmurs, rubs, or gallops.  ABDOMEN: Soft, right upper quadrant tenderness, nondistended. Bowel sounds present. No organomegaly or mass.  EXTREMITIES: No pedal edema, cyanosis, or clubbing.  NEUROLOGIC: Cranial nerves II through XII are intact. Muscle strength 5/5 in all  extremities. Sensation intact. Gait not checked.  PSYCHIATRIC: The patient is alert and oriented x 1  SKIN: No obvious rash, lesion, or ulcer.   LABS: Results for orders placed or performed during the hospital encounter of 11/23/14 (from the past 48 hour(s))  Comprehensive metabolic panel     Status: Abnormal   Collection Time: 11/24/14  6:01 AM  Result Value Ref Range   Sodium 136 135 - 145 mmol/L   Potassium 3.9 3.5 - 5.1 mmol/L   Chloride 106 101 - 111 mmol/L   CO2 24 22 - 32 mmol/L   Glucose, Bld 96 65 - 99 mg/dL   BUN 31 (H) 6 - 20 mg/dL   Creatinine, Ser 1.69 (H) 0.44 - 1.00 mg/dL   Calcium 8.2 (L) 8.9 - 10.3 mg/dL   Total Protein 5.1 (L) 6.5 - 8.1 g/dL   Albumin 2.7 (L) 3.5 - 5.0 g/dL   AST 18 15 - 41 U/L   ALT 16 14 - 54 U/L   Alkaline Phosphatase 53 38 - 126 U/L   Total Bilirubin 0.6 0.3 - 1.2 mg/dL   GFR calc non Af Amer 26 (L) >60 mL/min   GFR calc Af Amer 30 (L) >60 mL/min    Comment: (NOTE) The eGFR has been calculated using the CKD EPI equation. This calculation has not been validated in all clinical situations. eGFR's persistently <60 mL/min signify possible Chronic Kidney Disease.    Anion gap 6 5 - 15  Glucose, capillary     Status: Abnormal   Collection Time: 11/24/14  5:15 PM  Result Value Ref Range   Glucose-Capillary 114 (H) 65 - 99 mg/dL  CBC     Status: Abnormal   Collection Time: 11/25/14  6:25 AM  Result Value Ref Range   WBC 8.5 3.6 - 11.0 K/uL   RBC 3.23 (L) 3.80 - 5.20 MIL/uL   Hemoglobin 10.4 (L) 12.0 - 16.0 g/dL   HCT 30.8 (L) 35.0 - 47.0 %   MCV 95.5 80.0 - 100.0 fL   MCH 32.1 26.0 - 34.0 pg   MCHC 33.6 32.0 - 36.0 g/dL   RDW 14.2 11.5 - 14.5 %   Platelets 112 (L) 150 - 440 K/uL  Basic metabolic panel     Status: Abnormal   Collection Time: 11/25/14  6:25 AM  Result Value Ref Range   Sodium 141 135 - 145 mmol/L   Potassium 4.1 3.5 - 5.1 mmol/L   Chloride 111 101 - 111 mmol/L   CO2 25 22 - 32 mmol/L   Glucose, Bld 100 (H) 65 - 99  mg/dL   BUN 29 (H) 6 - 20 mg/dL  Creatinine, Ser 1.43 (H) 0.44 - 1.00 mg/dL   Calcium 8.3 (L) 8.9 - 10.3 mg/dL   GFR calc non Af Amer 31 (L) >60 mL/min   GFR calc Af Amer 36 (L) >60 mL/min    Comment: (NOTE) The eGFR has been calculated using the CKD EPI equation. This calculation has not been validated in all clinical situations. eGFR's persistently <60 mL/min signify possible Chronic Kidney Disease.    Anion gap 5 5 - 15   No components found for: ESR, C REACTIVE PROTEIN MICRO: Recent Results (from the past 720 hour(s))  Blood Culture (routine x 2)     Status: None (Preliminary result)   Collection Time: 11/23/14 10:43 AM  Result Value Ref Range Status   Specimen Description BLOOD  Final   Special Requests NONE  Final   Culture  Setup Time   Final    GRAM POSITIVE COCCI IN CHAINS AEROBIC BOTTLE ONLY CRITICAL RESULT CALLED TO, READ BACK BY AND VERIFIED WITH: SHEA BREWER  AT 1134 CTJ    Culture   Final    GROUP A STREP (S.PYOGENES) ISOLATED IN BOTH AEROBIC AND ANAEROBIC BOTTLES SUSCEPTIBILITIES TO FOLLOW    Report Status PENDING  Incomplete  Blood Culture (routine x 2)     Status: None (Preliminary result)   Collection Time: 11/23/14 10:46 AM  Result Value Ref Range Status   Specimen Description BLOOD  Final   Special Requests NONE  Final   Culture   Final    GROUP A STREP (S.PYOGENES) ISOLATED IN BOTH AEROBIC AND ANAEROBIC BOTTLES CRITICAL RESULT CALLED TO, READ BACK BY AND VERIFIED WITH: MEGAN OAKLEY @0340  11/24/14 BY AJO CONFIRMED BY MPG REFER TO OTHER SET FOR SENSITIVITIES ONCE COMPLETED    Report Status PENDING  Incomplete  Urine culture     Status: None (Preliminary result)   Collection Time: 11/23/14 10:47 AM  Result Value Ref Range Status   Specimen Description URINE, CATHETERIZED  Final   Special Requests NONE  Final   Culture   Final    >=100,000 COLONIES/mL ESCHERICHIA COLI HOLDING FOR ADDITIONAL POSSIBLE PATHOGEN    Report Status PENDING  Incomplete    Organism ID, Bacteria ESCHERICHIA COLI  Final      Susceptibility   Escherichia coli - MIC*    AMPICILLIN >=32 RESISTANT Resistant     CEFTAZIDIME <=1 SENSITIVE Sensitive     CEFAZOLIN 32 RESISTANT Resistant     CEFTRIAXONE <=1 SENSITIVE Sensitive     CIPROFLOXACIN >=4 RESISTANT Resistant     GENTAMICIN <=1 SENSITIVE Sensitive     IMIPENEM <=0.25 SENSITIVE Sensitive     TRIMETH/SULFA <=20 SENSITIVE Sensitive     CEFOXITIN 16 INTERMEDIATE Intermediate     * >=100,000 COLONIES/mL ESCHERICHIA COLI    IMAGING: Dg Chest Port 1 View  11/23/2014   CLINICAL DATA:  Fever and weakness.  EXAM: PORTABLE CHEST - 1 VIEW  COMPARISON:  10/20/2013  FINDINGS: Density at the left lung base is suspicious for pneumonia. There is mild atelectasis at the right lung base. No edema or pleural fluid identified. The heart size is normal. There is stable tortuosity of the thoracic aorta.  IMPRESSION: Increased density at the left lung base suspicious for pneumonia.   Electronically Signed   By: Aletta Edouard M.D.   On: 11/23/2014 11:41   Assessment:   Christina Hahn is a 81 y.o. female with dementia, residing at [facility] admitted with fevers, UA TNTC WBC, WBC 28 (down to 8), bacteremia and  UTI.   UCX E coli sensitive to ceftriaxone.  BCX + GRP A Strep, CXR with possible LLL infiltrate.  I suspect this is all a urinary source  Recommendations Continue current abx.  Will adjust abx based on further results.  Likely can be treated with oral abx at DC Thank you very much for allowing me to participate in the care of this patient. Please call with questions.   Cheral Marker. Ola Spurr, MD

## 2014-11-25 NOTE — Progress Notes (Signed)
   11/25/14 1044  Clinical Encounter Type  Visited With Patient and family together  Visit Type Initial  Consult/Referral To Chaplain  Spiritual Encounters  Spiritual Needs Prayer  Visited with patient and her daughter.  Provided pastoral presence, support and prayer.  Daughter said she had requested a chaplain to come and visit and was extremely appreciative to see me.  Patient was very sweet, calm, quiet, and appeared to be a bit sleepy. Daughter and patient both thanked me very much for my prayers, support and for my visit.  I will try to follow up with them tomorrow if able.  [place] [phone]

## 2014-11-25 NOTE — Progress Notes (Signed)
Physical Therapy Treatment Patient Details Name: Christina Hahn MRN: [id number] DOB: 1923-05-13 Today's Date: 11/25/2014    History of Present Illness Patient is a 81 y/o female that presents from ALF at [facility], where she was found to have a fever that started last night. Patient was found to have a UTI and sepsis. She has a history of Alzheimer's and requires 2L of O2 at baseline.     PT Comments    Pt willing to participate in therapy today. Requiring constant cueing of tasks but able to follow one step commands.  Improvement of bed mobility and transfers requiring decreased assistance. Improved sit<>stand transfers cont to require cues for safety.  Will continue to progress as tolerated to increase strength, endurance, and functional mobility tolerance.   Follow Up Recommendations  SNF     Equipment Recommendations  Rolling walker with 5" wheels    Recommendations for Other Services       Precautions / Restrictions Restrictions Weight Bearing Restrictions: No    Mobility  Bed Mobility Overal bed mobility: Needs Assistance Bed Mobility: Supine to Sit;Sit to Supine     Supine to sit: Min assist Sit to supine: Min assist   General bed mobility comments: pt required cues for sitting at EOB but able to transfer from supine to sit with minA, minA  sit to supine and required modA for boosting up to head of bed  Transfers Overall transfer level: Needs assistance Equipment used: Rolling walker (2 wheeled) Transfers: Sit to/from Stand Sit to Stand: Min assist         General transfer comment: showed improvement sit<>stand transfer with RW. Mod posterior lean but able to maintiain fair balance when stepping away from bed.   Ambulation/Gait                 Stairs            Wheelchair Mobility    Modified Rankin (Stroke Patients Only)       Balance Overall balance assessment: Needs assistance Sitting-balance support: Feet supported Sitting  balance-Leahy Scale: Fair     Standing balance support: Bilateral upper extremity supported Standing balance-Leahy Scale: Fair                      Cognition Arousal/Alertness: Awake/alert Behavior During Therapy: WFL for tasks assessed/performed;Impulsive Overall Cognitive Status: History of cognitive impairments - at baseline       Memory: Decreased recall of precautions;Decreased short-term memory              Exercises General Exercises - Lower Extremity Ankle Circles/Pumps: AROM;Both;20 reps;Supine Heel Slides: AAROM;Both;20 reps;Supine Hip ABduction/ADduction: AAROM;Both;20 reps;Supine Straight Leg Raises: AAROM;Both;10 reps;Supine Hip Flexion/Marching: AROM;10 reps    General Comments        Pertinent Vitals/Pain      Home Living                      Prior Function            PT Goals (current goals can now be found in the care plan section) Progress towards PT goals: Progressing toward goals    Frequency  Min 2X/week    PT Plan Current plan remains appropriate    Co-evaluation             End of Session Equipment Utilized During Treatment: Gait belt Activity Tolerance: Patient tolerated treatment well;Patient limited by fatigue Patient left: in bed;with call bell/phone within reach;with bed  alarm set;with family/visitor present     Time: 1330-1400 PT Time Calculation (min) (ACUTE ONLY): 30 min  Charges:  $Gait Training: 8-22 mins $Therapeutic Exercise: 8-22 mins                    G Codes:      [place] 2014/12/20, 2:06 PM  Shamia Uppal, PTA

## 2014-11-25 NOTE — Progress Notes (Signed)
PT Cancellation Note  Patient Details Name: DIAMOND JENTZ MRN: [id number] DOB: 1922-08-10   Cancelled Treatment:    Reason Eval/Treat Not Completed:  (Treatment attempted 2x; pt with Chaplain and other services) Discussed with member of therapy team to recheck pt later today.    Erline Levine Bishop 11/25/2014, 11:31 AM

## 2014-11-25 NOTE — Progress Notes (Signed)
Pt is alert but pleasantly confused. VSS. IVF infusing. Voiding without difficulty, incontinent. Sleeping between care, will continue to monitor.

## 2014-11-25 NOTE — Clinical Social Work Note (Signed)
Clinical Social Work Assessment  Patient Details  Name: Christina Hahn MRN: [id number] Date of Birth: 08-05-1922  Date of referral:  11/23/14               Reason for consult:  Facility Placement                Permission sought to share information with:  Family Supports Permission granted to share information::  No  Name::     Christina Hahn POA (h) [phone]  Agency::     Relationship::  daughter  Contact Information:     Housing/Transportation Living arrangements for the past 2 months:  [place] [employer]) Source of Information:  Adult Children Curator) Patient Interpreter Needed:  None Criminal Activity/Legal Involvement Pertinent to Current Situation/Hospitalization:  No - Comment as needed Significant Relationships:  Adult Children Lives with:  Facility Resident Do you feel safe going back to the place where you live?  Yes Need for family participation in patient care:  Yes (Comment) (Pt has 3 daughters)  Care giving concerns:  Pt needs to regain strength prior to returning to ALF.  The breaks on pt's walker are no longer working therefore pt will need a new walker with breaks.   Social Worker assessment / plan:  Pt is a 81 y/o widowed female who currently resides at [facility].  Pt has been a resident there for 5 years.  Pt has 4 adult children who are very supportive.  CSW spoke to pt's daughter Christina Hahn, at bedside, as pt was sleeping and has been disoriented.  CSW explained that PT is recommending STR.  Pt's daughter is in agreement to pt going to a SNF for STR.  The family prefers [facility].  CSW explained how the referral process works, initiated bed search and will extend bed offers once received.    Employment status:  Retired Forensic scientist:  Medicare PT Recommendations:  [place] / Referral to community resources:  [place]  Patient/Family's  Response to care:  Pt's daughter was pleasant and appreciative of CSW assistance.    Patient/Family's Understanding of and Emotional Response to Diagnosis, Current Treatment, and Prognosis:  Daughter praised the nursing staff and expressed appreciation/relief for CSW services.  Emotional Assessment Appearance:  Appears stated age Attitude/Demeanor/Rapport:  Other (sleeping soundly) Affect (typically observed):  Stable Orientation:  Oriented to Self Alcohol / Substance use:  Not Applicable Psych involvement (Current and /or in the community):  No (Comment)  Discharge Needs  Concerns to be addressed:  No discharge needs identified Readmission within the last 30 days:  No Current discharge risk:  None Barriers to Discharge:  No Barriers Identified  [facility], [place], [place] 11/25/2014, 1:55 PM

## 2014-11-25 NOTE — Progress Notes (Signed)
[place] at [place] NAME: Annebelle Bostic    MR#:  [id number]  DATE OF BIRTH:  [birth year]  SUBJECTIVE:  CHIEF COMPLAINT:   Chief Complaint  Patient presents with  . Fever   Better. Afebrile now. Family at bedside. Patient has no concerns REVIEW OF SYSTEMS:    Review of Systems  Constitutional: Positive for weight loss. Negative for fever and chills.  HENT: Negative for sore throat.   Eyes: Negative for blurred vision, double vision and pain.  Respiratory: Negative for cough, hemoptysis, shortness of breath and wheezing.   Cardiovascular: Negative for chest pain, palpitations, orthopnea and leg swelling.  Gastrointestinal: Positive for nausea and abdominal pain. Negative for heartburn, vomiting, diarrhea and constipation.  Genitourinary: Negative for dysuria and hematuria.  Musculoskeletal: Positive for back pain. Negative for joint pain.  Skin: Negative for rash.  Neurological: Positive for weakness. Negative for sensory change, speech change, focal weakness and headaches.  Endo/Heme/Allergies: Does not bruise/bleed easily.  Psychiatric/Behavioral: Positive for memory loss. Negative for depression. The patient is not nervous/anxious.       DRUG ALLERGIES:   Allergies  Allergen Reactions  . Antivert [Meclizine]   . Codeine   . Dramamine [Dimenhydrinate]   . Flonase [Fluticasone Propionate]   . Penicillins   . Shrimp [Shellfish Allergy]   . Tape Other (See Comments)    Tears skin  . Voltaren [Diclofenac Sodium]     VITALS:  Blood pressure 137/75, pulse 66, temperature 98.5 F (36.9 C), temperature source Oral, resp. rate 17, height 5\' 5"  (1.651 m), weight 74.021 kg (163 lb 3 oz), SpO2 97 %.  PHYSICAL EXAMINATION:   Physical Exam  GENERAL:  81 y.o.-year-old patient lying in the bed with no acute distress.  EYES: Pupils equal, round, reactive to light and accommodation. No scleral icterus. Extraocular muscles intact.   HEENT: Head atraumatic, normocephalic. Oropharynx and nasopharynx clear. Dry NECK:  Supple, no jugular venous distention. No thyroid enlargement, no tenderness.  LUNGS: Normal breath sounds bilaterally, no wheezing, rales, rhonchi. No use of accessory muscles of respiration.  CARDIOVASCULAR: S1, S2 normal. No murmurs, rubs, or gallops.  ABDOMEN: Soft, nontender, nondistended. Bowel sounds present. No organomegaly or mass. Suprapubic tenderness. EXTREMITIES: No cyanosis, clubbing or edema b/l. Right wrist brace. NEUROLOGIC: Cranial nerves II through XII are intact. No focal Motor or sensory deficits b/l.   PSYCHIATRIC: The patient is alert and awake. SKIN: No obvious rash, lesion, or ulcer.    LABORATORY PANEL:   CBC  Recent Labs Lab 11/25/14 0625  WBC 8.5  HGB 10.4*  HCT 30.8*  PLT 112*   ------------------------------------------------------------------------------------------------------------------  Chemistries   Recent Labs Lab 11/24/14 0601 11/25/14 0625  NA 136 141  K 3.9 4.1  CL 106 111  CO2 24 25  GLUCOSE 96 100*  BUN 31* 29*  CREATININE 1.69* 1.43*  CALCIUM 8.2* 8.3*  AST 18  --   ALT 16  --   ALKPHOS 53  --   BILITOT 0.6  --    ------------------------------------------------------------------------------------------------------------------  Cardiac Enzymes No results for input(s): TROPONINI in the last 168 hours. ------------------------------------------------------------------------------------------------------------------  RADIOLOGY:  No results found.   ASSESSMENT AND PLAN:   Patient is a 81 year old with HTN, Dementia  brought in to the hospital with fever  * Bacteremia GPC chains. Started vancomycin 11/24/2014 while waiting for final cx. Likely enterococcus.  * GNR UTI with Severe sepsis - POA On IVF. Abx. Ceftriaxone Await cx results.   *  ARF due to ATN - POA Iimproving slowly. Monitor I/Os Stop IVF  * Asthma continue nebulizers  when necessary, also on Dulera  * Depression: Continue Celexa  * Dementia - Monitor for inpatient delirium  All the records are reviewed and case discussed with Care Management/Social Workerr. Management plans discussed with the patient, family and they are in agreement.  CODE STATUS: DNR  DVT Prophylaxis: SCDs  TOTAL TIME TAKING CARE OF THIS PATIENT: 35 minutes.   POSSIBLE D/C IN 2-3 DAYS, DEPENDING ON CLINICAL CONDITION.   Hillary Bow R M.D on 11/25/2014 at 11:16 AM  Between 7am to 6pm - Pager - [phone]  After 6pm go to [email or web address] - password [email or web address] [facility]  Office  [phone]  CC: Primary care physician; [name], MD

## 2014-11-26 ENCOUNTER — Inpatient Hospital Stay: Payer: Medicare Other

## 2014-11-26 LAB — URINE CULTURE: Culture: >=100000

## 2014-11-26 LAB — CULTURE, BLOOD (ROUTINE X 2)

## 2014-11-26 LAB — PLATELET COUNT: PLATELETS: 132 10*3/uL — AB (ref 150–440)

## 2014-11-26 MED ORDER — CIPROFLOXACIN HCL 500 MG PO TABS
500.0000 mg | ORAL_TABLET | Freq: Two times a day (BID) | ORAL | Status: DC
  Administered 2014-11-26 – 2014-11-27 (×2): 500 mg via ORAL
  Filled 2014-11-26 (×2): qty 1

## 2014-11-26 MED ORDER — SULFAMETHOXAZOLE-TRIMETHOPRIM 800-160 MG PO TABS
1.0000 | ORAL_TABLET | Freq: Two times a day (BID) | ORAL | Status: DC
  Administered 2014-11-26 – 2014-11-27 (×3): 1 via ORAL
  Filled 2014-11-26 (×4): qty 1

## 2014-11-26 MED ORDER — ENSURE ENLIVE PO LIQD
237.0000 mL | Freq: Three times a day (TID) | ORAL | Status: DC
  Administered 2014-11-26 – 2014-11-27 (×4): 237 mL via ORAL

## 2014-11-26 NOTE — Plan of Care (Signed)
Problem: Discharge Progression Outcomes Goal: Other Discharge Outcomes/Goals Plan of care progress to goals: 1. No c/o pain 2. Hemodynamically: VSS, afebrile. Remains on 2L oxygen. Infectious disease consulted for +BC 3. Complications: Patient is HOH which can be a barrier between pt/caregiver.  4. Activity: lethargic throughout shift which caused pt to remain in bed No fall/injury this shift. Will continue to assess.

## 2014-11-26 NOTE — Progress Notes (Signed)
Date of Admission:  11/23/2014     ID: Christina Hahn is a 81 y.o. female with  UTI and bactermia  Active Problems:   Fever   Bacteremia   Acute renal failure   UTI (urinary tract infection)   Subjective: Daughter at bedside. Remains confused. Largely incontinent  Medications:  . aspirin EC  81 mg Oral Daily  . calcium-vitamin D  1 tablet Oral Daily  . cefTRIAXone (ROCEPHIN)  IV  1 g Intravenous Q24H  . cholecalciferol  1,000 Units Oral Daily  . citalopram  20 mg Oral BID  . darifenacin  7.5 mg Oral Daily  . docusate sodium  100 mg Oral BID  . donepezil  5 mg Oral Daily  . erythromycin  1 application Left Eye QID  . feeding supplement (ENSURE ENLIVE)  237 mL Oral TID BM  . heparin  5,000 Units Subcutaneous 3 times per day  . levothyroxine  75 mcg Oral QAC breakfast  . loratadine  10 mg Oral Daily  . mometasone-formoterol  2 puff Inhalation BID  . montelukast  10 mg Oral Daily  . multivitamin with minerals  1 tablet Oral BID  . OLANZapine  2.5 mg Oral QHS  . pantoprazole  40 mg Oral Daily  . sodium chloride  1 spray Each Nare [id number]    Objective: Vital signs in last 24 hours: Temp:  [97.4 F (36.3 C)-98.6 F (37 C)] 98.6 F (37 C) (06/09 1400) Pulse Rate:  [54-63] 58 (06/09 1400) Resp:  [16-18] 18 (06/09 1400) BP: (102-149)/(42-58) 102/51 mmHg (06/09 1400) SpO2:  [97 %-100 %] 97 % (06/09 1400)  GENERAL: 81 y.o.-year-old patient lying in the bed with no acute distress.  EYES: Pupils equal, round, reactive to light and accommodation. No scleral icterus. Extraocular muscles intact.  HEENT: Head atraumatic, normocephalic. Oropharynx and nasopharynx clear.  NECK: Supple, no jugular venous distention. No thyroid enlargement, no tenderness.  LUNGS: Normal breath sounds bilaterally, no wheezing, rales,rhonchi or crepitation. No use of accessory muscles of respiration.  CARDIOVASCULAR: S1, S2 normal. No murmurs, rubs, or gallops.  ABDOMEN: Soft, right upper quadrant  tenderness, nondistended. Bowel sounds present. No organomegaly or mass.  EXTREMITIES: No pedal edema, cyanosis, or clubbing.  NEUROLOGIC: Cranial nerves II through XII are intact. Muscle strength 5/5 in all extremities. Sensation intact. Gait not checked.  PSYCHIATRIC: The patient is alert and oriented x 1  SKIN: No obvious rash, lesion, or ulcer.   Lab Results  Recent Labs  11/24/14 0601 11/25/14 0625  WBC  --  8.5  HGB  --  10.4*  HCT  --  30.8*  NA 136 141  K 3.9 4.1  CL 106 111  CO2 24 25  BUN 31* 29*  CREATININE 1.69* 1.43*   Liver Panel  Recent Labs  11/24/14 0601  PROT 5.1*  ALBUMIN 2.7*  AST 18  ALT 16  ALKPHOS 53  BILITOT 0.6   Microbiology:  Results for orders placed or performed during the hospital encounter of 11/23/14  Blood Culture (routine x 2)     Status: None   Collection Time: 11/23/14 10:43 AM  Result Value Ref Range Status   Specimen Description BLOOD  Final   Special Requests NONE  Final   Culture  Setup Time   Final    GRAM POSITIVE COCCI IN CHAINS AEROBIC BOTTLE ONLY CRITICAL RESULT CALLED TO, READ BACK BY AND VERIFIED WITH: SHEA BREWER  AT [place] [facility]    Culture   Final  GROUP A STREP (S.PYOGENES) ISOLATED IN BOTH AEROBIC AND ANAEROBIC BOTTLES    Report Status 11/26/2014 FINAL  Final   Organism ID, Bacteria GROUP A STREP (S.PYOGENES) ISOLATED  Final      Susceptibility   Group a strep (s.pyogenes) isolated - MIC (ETEST)*    PENICILLIN Value in next row Sensitive      SENSITIVE0.047    CEFTRIAXONE Value in next row Sensitive      SENSITIVE0.023    VANCOMYCIN Value in next row Sensitive      SENSITIVE0.50    LEVOFLOXACIN Value in next row Sensitive      SENSITIVE0.50    * GROUP A STREP (S.PYOGENES) ISOLATED  Blood Culture (routine x 2)     Status: None   Collection Time: 11/23/14 10:46 AM  Result Value Ref Range Status   Specimen Description BLOOD  Final   Special Requests NONE  Final   Culture  Setup Time   Final    GRAM  POSITIVE COCCI IN CHAINS IN BOTH AEROBIC AND ANAEROBIC BOTTLES    Culture   Final    GROUP A STREP (S.PYOGENES) ISOLATED IN BOTH AEROBIC AND ANAEROBIC BOTTLES CRITICAL RESULT CALLED TO, READ BACK BY AND VERIFIED WITH: MEGAN OAKLEY @0340  11/24/14 BY AJO CONFIRMED BY [facility] REFER TO OTHER SET FOR SENSITIVITIES     Report Status 11/26/2014 FINAL  Final  Urine culture     Status: None   Collection Time: 11/23/14 10:47 AM  Result Value Ref Range Status   Specimen Description URINE, CATHETERIZED  Final   Special Requests NONE  Final   Culture   Final    >=100,000 COLONIES/mL ESCHERICHIA COLI 50,000 COLONIES/mL AEROCOCCUS URINAE    Report Status 11/26/2014 FINAL  Final   Organism ID, Bacteria ESCHERICHIA COLI  Final      Susceptibility   Escherichia coli - MIC*    AMPICILLIN >=32 RESISTANT Resistant     CEFTAZIDIME <=1 SENSITIVE Sensitive     CEFAZOLIN 32 RESISTANT Resistant     CEFTRIAXONE <=1 SENSITIVE Sensitive     CIPROFLOXACIN >=4 RESISTANT Resistant     GENTAMICIN <=1 SENSITIVE Sensitive     IMIPENEM <=0.25 SENSITIVE Sensitive     TRIMETH/SULFA <=20 SENSITIVE Sensitive     CEFOXITIN 16 INTERMEDIATE Intermediate     * >=100,000 COLONIES/mL ESCHERICHIA COLI     Studies/Results: Dg Chest 2 View  11/26/2014   CLINICAL DATA:  Hypoxia cold  EXAM: CHEST  2 VIEW  COMPARISON:  Multiple prior studies including 11/23/2014  FINDINGS: Mild cardiac enlargement stable. Vascular pattern within normal limits. Right lung is clear. There are numerous linear opacities in the left lower lobe which could again represent pneumonia. This is mildly more prominent when compared the prior study. There is also evidence of 5 cm rounded air-filled structure at the left base. This could represent either hiatal hernia or diaphragmatic hernia or eventration not appreciated on the prior study are multiple prior studies.  IMPRESSION: Again identified is left lower lobe infiltrate concerning for possible pneumonia with  tiny associated effusion. Infiltrate is mildly worse when compared to 11/23/2014.  Hiatal hernia versus diaphragmatic hernia or eventration.   Electronically Signed   By: Skipper Cliche M.D.   On: 11/26/2014 14:43   Assessment/Plan: Christina Hahn is a 81 y.o. female with dementia, residing at [facility] admitted with fevers, UA TNTC WBC, WBC 28 (down to 8), Grp A strep bacteremia and UTI with UCX growing MDR E coli (sensitive to bactrim)and also  Aerococcus urinae.Marland Kitchen  BCX + GRP A Strep, CXR with possible LLL infiltrate.  She is penicillin allergic but pt nor family know reaction.    Recommendations Would change abx to bactrim ds bid to cover the E coli and cipro 500 bid to cover the Grp A strep and Aerococcus. Would give total of 10 days from admission date then stop abx on 12/03/14. Can dc to her residence tomorrow if otherwise stable.  Would benefit from otpt urolgoy evaluation if UTI recurs. Thank you very much for allowing me to participate in the care of this patient. Please call with questions  [place], Rayce Brahmbhatt   11/26/2014, 2:59 PM

## 2014-11-26 NOTE — Progress Notes (Signed)
[place] at [place] NAME: Christina Hahn    MR#:  [id number]  DATE OF BIRTH:  Apr 05, 1923  SUBJECTIVE:  CHIEF COMPLAINT:   Chief Complaint  Patient presents with  . Fever   Better. Afebrile now. Family at bedside. Patient has no concerns. Daughter at bedside noticed patient was coughing. REVIEW OF SYSTEMS:    Review of Systems  Constitutional: Negative for fever, chills and weight loss.  HENT: Negative for sore throat.   Eyes: Negative for blurred vision, double vision and pain.  Respiratory: Positive for cough. Negative for hemoptysis, shortness of breath and wheezing.   Cardiovascular: Negative for chest pain, palpitations, orthopnea and leg swelling.  Gastrointestinal: Negative for heartburn, nausea, vomiting, abdominal pain, diarrhea and constipation.  Genitourinary: Negative for dysuria and hematuria.  Musculoskeletal: Positive for back pain. Negative for joint pain.  Skin: Negative for rash.  Neurological: Positive for weakness. Negative for sensory change, speech change, focal weakness and headaches.  Endo/Heme/Allergies: Does not bruise/bleed easily.  Psychiatric/Behavioral: Positive for memory loss. Negative for depression. The patient is not nervous/anxious.       DRUG ALLERGIES:   Allergies  Allergen Reactions  . Antivert [Meclizine]   . Codeine   . Dramamine [Dimenhydrinate]   . Flonase [Fluticasone Propionate]   . Penicillins   . Shrimp [Shellfish Allergy]   . Tape Other (See Comments)    Tears skin  . Voltaren [Diclofenac Sodium]     VITALS:  Blood pressure 149/58, pulse 63, temperature 97.4 F (36.3 C), temperature source Oral, resp. rate 16, height 5\' 5"  (1.651 m), weight 74.021 kg (163 lb 3 oz), SpO2 99 %.  PHYSICAL EXAMINATION:   Physical Exam  GENERAL:  81 y.o.-year-old patient lying in the bed with no acute distress.  EYES: Pupils equal, round, reactive to light and accommodation. No scleral  icterus. Extraocular muscles intact.  HEENT: Head atraumatic, normocephalic. Oropharynx and nasopharynx clear. Dry NECK:  Supple, no jugular venous distention. No thyroid enlargement, no tenderness.  LUNGS: Normal breath sounds bilaterally, no wheezing, rales. No use of accessory muscles of respiration. Ronchi bases. CARDIOVASCULAR: S1, S2 normal. No murmurs, rubs, or gallops.  ABDOMEN: Soft, nontender, nondistended. Bowel sounds present. No organomegaly or mass. EXTREMITIES: No cyanosis, clubbing or edema b/l. Right wrist brace. NEUROLOGIC: Cranial nerves II through XII are intact. No focal Motor or sensory deficits b/l.   PSYCHIATRIC: The patient is alert and awake. SKIN: No obvious rash, lesion, or ulcer.    LABORATORY PANEL:   CBC  Recent Labs Lab 11/25/14 0625 11/26/14 0532  WBC 8.5  --   HGB 10.4*  --   HCT 30.8*  --   PLT 112* 132*   ------------------------------------------------------------------------------------------------------------------  Chemistries   Recent Labs Lab 11/24/14 0601 11/25/14 0625  NA 136 141  K 3.9 4.1  CL 106 111  CO2 24 25  GLUCOSE 96 100*  BUN 31* 29*  CREATININE 1.69* 1.43*  CALCIUM 8.2* 8.3*  AST 18  --   ALT 16  --   ALKPHOS 53  --   BILITOT 0.6  --    ------------------------------------------------------------------------------------------------------------------  Cardiac Enzymes No results for input(s): TROPONINI in the last 168 hours. ------------------------------------------------------------------------------------------------------------------  RADIOLOGY:  No results found.   ASSESSMENT AND PLAN:   Patient is a 81 year old with HTN, Dementia  brought in to the hospital with fever  * Group A Streptococcus Bacteremia Vancomycin stopped. Appreciate ID help. Will switch to PO tomorrow if improving and  d/c to SNF.  * E.coli UTI with Severe sepsis - POA On IVF. Abx. Ceftriaxone Await cx results.   * ARF due  to ATN - POA Improving slowly. Monitor I/Os Stopped IVF  * Asthma continue nebulizers when necessary, also on Dulera.  * Depression: Continue Celexa  * Dementia - Monitor for inpatient delirium  All the records are reviewed and case discussed with Care Management/Social Workerr. Management plans discussed with the patient, family and they are in agreement.  CODE STATUS: DNR  DVT Prophylaxis: SCDs  TOTAL TIME TAKING CARE OF THIS PATIENT: 35 minutes.   Discharge tomorrow to SNF on PO abx.   Hillary Bow R M.D on 11/26/2014 at 12:28 PM  Between 7am to 6pm - Pager - [phone]  After 6pm go to [email or web address] - password [email or web address] [facility]  Office  [phone]  CC: Primary care physician; [name], MD

## 2014-11-26 NOTE — Plan of Care (Signed)
Problem: Discharge Progression Outcomes Goal: Discharge plan in place and appropriate Individualiation  Likes to be called Christina Hahn Lives in [facility] assisted living Hx HTN, GERD, depression, arthritis, eczema, asthma, anxiety, dementia, thyroid disease      Goal: Other Discharge Outcomes/Goals Plan of Care Progress to Goal:  Pt started on po antibiotics, voiding inc at this time, pt without c/o pain, family at bedside

## 2014-11-26 NOTE — Clinical Social Work Note (Signed)
Clinical Social Worker provided bed offers to pt's daughter. Pt's daughter chose [facility]. CSW updated facility on bed choice. CSW will continue to follow to facilitate discharge.   Darden Dates, MSW, LCSW Clinical Social Worker  [phone]

## 2014-11-26 NOTE — Clinical Social Work Placement (Signed)
   CLINICAL SOCIAL WORK PLACEMENT  NOTE  Date:  11/26/2014  Patient Details  Name: KAYLIE RITTER MRN: [id number] Date of Birth: [birth year]  Clinical Social Work is seeking post-discharge placement for this patient at the [place] level of care (*CSW will initial, date and re-position this form in  chart as items are completed):  Yes   Patient/family provided with [place] Work Department's list of facilities offering this level of care within the geographic area requested by the patient (or if unable, by the patient's family).  Yes   Patient/family informed of their freedom to choose among providers that offer the needed level of care, that participate in Medicare, Medicaid or managed care program needed by the patient, have an available bed and are willing to accept the patient.  Yes   Patient/family informed of [facility]'s ownership interest in [facility] and [facility], as well as of the fact that they are under no obligation to receive care at these facilities.  PASRR submitted to EDS on 11/25/14     PASRR number received on 11/25/14     Existing PASRR number confirmed on       FL2 transmitted to all facilities in geographic area requested by pt/family on 11/25/14     FL2 transmitted to all facilities within larger geographic area on       Patient informed that his/her managed care company has contracts with or will negotiate with certain facilities, including the following:        Yes   Patient/family informed of bed offers received.  Patient chooses bed at  [facility])     Physician recommends and patient chooses bed at  [facility])    Patient to be transferred to   on  .  Patient to be transferred to facility by       Patient family notified on   of transfer.  Name of family member notified:        PHYSICIAN       Additional Comment:    _______________________________________________ Darden Dates, LCSW 11/26/2014,  12:11 PM

## 2014-11-27 DIAGNOSIS — D649 Anemia, unspecified: Secondary | ICD-10-CM | POA: Diagnosis not present

## 2014-11-27 DIAGNOSIS — N3000 Acute cystitis without hematuria: Secondary | ICD-10-CM | POA: Diagnosis not present

## 2014-11-27 DIAGNOSIS — F0390 Unspecified dementia without behavioral disturbance: Secondary | ICD-10-CM | POA: Diagnosis not present

## 2014-11-27 DIAGNOSIS — Z743 Need for continuous supervision: Secondary | ICD-10-CM | POA: Diagnosis not present

## 2014-11-27 DIAGNOSIS — G309 Alzheimer's disease, unspecified: Secondary | ICD-10-CM | POA: Diagnosis not present

## 2014-11-27 DIAGNOSIS — R509 Fever, unspecified: Secondary | ICD-10-CM | POA: Diagnosis not present

## 2014-11-27 DIAGNOSIS — B9620 Unspecified Escherichia coli [E. coli] as the cause of diseases classified elsewhere: Secondary | ICD-10-CM | POA: Diagnosis present

## 2014-11-27 DIAGNOSIS — E079 Disorder of thyroid, unspecified: Secondary | ICD-10-CM | POA: Diagnosis not present

## 2014-11-27 DIAGNOSIS — K219 Gastro-esophageal reflux disease without esophagitis: Secondary | ICD-10-CM | POA: Diagnosis not present

## 2014-11-27 DIAGNOSIS — M1990 Unspecified osteoarthritis, unspecified site: Secondary | ICD-10-CM | POA: Diagnosis not present

## 2014-11-27 DIAGNOSIS — N390 Urinary tract infection, site not specified: Secondary | ICD-10-CM

## 2014-11-27 DIAGNOSIS — J189 Pneumonia, unspecified organism: Secondary | ICD-10-CM | POA: Diagnosis present

## 2014-11-27 DIAGNOSIS — E46 Unspecified protein-calorie malnutrition: Secondary | ICD-10-CM | POA: Diagnosis not present

## 2014-11-27 DIAGNOSIS — R7881 Bacteremia: Secondary | ICD-10-CM | POA: Diagnosis not present

## 2014-11-27 DIAGNOSIS — F419 Anxiety disorder, unspecified: Secondary | ICD-10-CM | POA: Diagnosis not present

## 2014-11-27 DIAGNOSIS — L309 Dermatitis, unspecified: Secondary | ICD-10-CM | POA: Diagnosis not present

## 2014-11-27 DIAGNOSIS — B950 Streptococcus, group A, as the cause of diseases classified elsewhere: Secondary | ICD-10-CM | POA: Diagnosis not present

## 2014-11-27 DIAGNOSIS — E039 Hypothyroidism, unspecified: Secondary | ICD-10-CM | POA: Diagnosis not present

## 2014-11-27 DIAGNOSIS — J45909 Unspecified asthma, uncomplicated: Secondary | ICD-10-CM | POA: Diagnosis not present

## 2014-11-27 DIAGNOSIS — M150 Primary generalized (osteo)arthritis: Secondary | ICD-10-CM | POA: Diagnosis not present

## 2014-11-27 DIAGNOSIS — M25559 Pain in unspecified hip: Secondary | ICD-10-CM | POA: Diagnosis not present

## 2014-11-27 DIAGNOSIS — Z9981 Dependence on supplemental oxygen: Secondary | ICD-10-CM | POA: Diagnosis not present

## 2014-11-27 DIAGNOSIS — K5900 Constipation, unspecified: Secondary | ICD-10-CM | POA: Diagnosis not present

## 2014-11-27 DIAGNOSIS — I10 Essential (primary) hypertension: Secondary | ICD-10-CM | POA: Diagnosis not present

## 2014-11-27 DIAGNOSIS — A419 Sepsis, unspecified organism: Secondary | ICD-10-CM | POA: Diagnosis not present

## 2014-11-27 MED ORDER — SULFAMETHOXAZOLE-TRIMETHOPRIM 800-160 MG PO TABS: 1.0000 | ORAL_TABLET | Freq: Two times a day (BID) | ORAL | Status: DC

## 2014-11-27 MED ORDER — DOCUSATE SODIUM 100 MG PO CAPS: 100.0000 mg | ORAL_CAPSULE | Freq: Two times a day (BID) | ORAL | Status: AC

## 2014-11-27 MED ORDER — OLANZAPINE 2.5 MG PO TABS: 2.5000 mg | ORAL_TABLET | Freq: Every day | ORAL | Status: AC

## 2014-11-27 MED ORDER — ENSURE ENLIVE PO LIQD: 237.0000 mL | Freq: Three times a day (TID) | ORAL | Status: AC

## 2014-11-27 MED ORDER — CIPROFLOXACIN HCL 500 MG PO TABS: 500.0000 mg | ORAL_TABLET | Freq: Two times a day (BID) | ORAL | Status: DC

## 2014-11-27 NOTE — Discharge Instructions (Signed)
°  DIET:  °Cardiac diet ° °DISCHARGE CONDITION:  °Stable ° °ACTIVITY:  °Activity as tolerated ° °OXYGEN:  °Home Oxygen: Yes.   °  °Oxygen Delivery: 2 liters/min via Patient connected to nasal cannula oxygen ° °DISCHARGE LOCATION:  °nursing home  ° °If you experience worsening of your admission symptoms, develop shortness of breath, life threatening emergency, suicidal or homicidal thoughts you must seek medical attention immediately by calling 911 or calling your MD immediately  if symptoms less severe. ° °You Must read complete instructions/literature along with all the possible adverse reactions/side effects for all the Medicines you take and that have been prescribed to you. Take any new Medicines after you have completely understood and accpet all the possible adverse reactions/side effects.  ° °Please note ° °You were cared for by a hospitalist during your hospital stay. If you have any questions about your discharge medications or the care you received while you were in the hospital after you are discharged, you can call the unit and asked to speak with the hospitalist on call if the hospitalist that took care of you is not available. Once you are discharged, your primary care physician will handle any further medical issues. Please note that NO REFILLS for any discharge medications will be authorized once you are discharged, as it is imperative that you return to your primary care physician (or establish a relationship with a primary care physician if you do not have one) for your aftercare needs so that they can reassess your need for medications and monitor your lab values. ° ° ° °

## 2014-11-27 NOTE — Clinical Social Work Note (Signed)
Pt is ready for discharge today to [facility]. Pt and daughter are agreeable to discharge plan. Pt's daughter will follow up with facility regarding paperwork. RN will call report and EMS will provide transportation. CSW is signing off as no further needs identified.   Christina Hahn, MSW, LCSW Clinical Social Worker  [phone]

## 2014-11-27 NOTE — Clinical Social Work Placement (Signed)
   CLINICAL SOCIAL WORK PLACEMENT  NOTE  Date:  11/27/2014  Patient Details  Name: Christina Hahn MRN: [id number] Date of Birth: Mar 06, 1923  Clinical Social Work is seeking post-discharge placement for this patient at the [place] level of care (*CSW will initial, date and re-position this form in  chart as items are completed):  Yes   Patient/family provided with [place] Work Department's list of facilities offering this level of care within the geographic area requested by the patient (or if unable, by the patient's family).  Yes   Patient/family informed of their freedom to choose among providers that offer the needed level of care, that participate in Medicare, Medicaid or managed care program needed by the patient, have an available bed and are willing to accept the patient.  Yes   Patient/family informed of [facility]'s ownership interest in [facility] and [facility], as well as of the fact that they are under no obligation to receive care at these facilities.  PASRR submitted to EDS on 11/25/14     PASRR number received on 11/25/14     Existing PASRR number confirmed on       FL2 transmitted to all facilities in geographic area requested by pt/family on 11/25/14     FL2 transmitted to all facilities within larger geographic area on       Patient informed that his/her managed care company has contracts with or will negotiate with certain facilities, including the following:        Yes   Patient/family informed of bed offers received.  Patient chooses bed at  [facility])     Physician recommends and patient chooses bed at  [facility])    Patient to be transferred to   on 11/27/14.  Patient to be transferred to facility by  [facility])     Patient family notified on 11/27/14 of transfer.  Name of family member notified:   (Anglea "Laurey Arrow", daughter)     PHYSICIAN       Additional Comment:     _______________________________________________ Darden Dates, LCSW 11/27/2014, 12:57 PM

## 2014-11-27 NOTE — Discharge Summary (Signed)
[facility] [place] NAME: Christina Hahn    MR#:  [id number]  DATE OF BIRTH:  [birth year]  DATE OF ADMISSION:  11/23/2014 ADMITTING PHYSICIAN: Dustin Flock, MD  DATE OF DISCHARGE: No discharge date for patient encounter.  PRIMARY CARE PHYSICIAN: [name], MD    ADMISSION DIAGNOSIS:  UTI (lower urinary tract infection) [N39.0] Sepsis, due to unspecified organism [A41.9]  DISCHARGE DIAGNOSIS:  Principal Problem:   Group A streptococcal infection Active Problems:   Fever   Bacteremia   Acute renal failure   E-coli UTI   CAP (community acquired pneumonia)   SECONDARY DIAGNOSIS:   Past Medical History  Diagnosis Date  . Arthritis   . Hypertension   . GERD (gastroesophageal reflux disease)   . Depression   . Eczema   . Asthma   . Anxiety   . Dementia   . Thyroid disease      ADMITTING HISTORY  HISTORY OF PRESENT ILLNESS: Christina Hahn is a 81 y.o. female with a known history of Dementia, who currently resides in assisted living was brought to the hospital after she was noted to have fever. Her daughters are at the bedside. Patient usually does not complain much but has been having some headache which she contributes to some sinus. She has not had any urinary complaints however has had a history of recurrent urinary tract infection. Patient has not been eating or drinking much for the past few days. She was brought to the ED with fever and noted to have a urinary tract infection. Patient also has been complaining of right upper quadrant abdominal pain   HOSPITAL COURSE:   Patient is a 81 year old with HTN, Dementia brought in to the hospital with fever  * Group A Streptococcus Bacteremia Vancomycin stopped. Appreciate ID help.  * E.coli UTI with Aerococcus urinae with Severe sepsis - POA On IVF. IV abx changed to PO  * ARF due to ATN - POA Resolved  * Asthma continue nebulizers when necessary, also on  Dulera.  * Depression: Continue Celexa  * Dementia - Monitor for inpatient delirium  Changed to PO Bactrim DS and Cipro till 12/03/2014 per Dr/ Fitzgerald's recommendation. F/u with him 1 week  CONSULTS OBTAINED:  Treatment Team:  Adrian Prows, MD  DRUG ALLERGIES:   Allergies  Allergen Reactions  . Antivert [Meclizine]   . Codeine   . Dramamine [Dimenhydrinate]   . Flonase [Fluticasone Propionate]   . Penicillins   . Shrimp [Shellfish Allergy]   . Tape Other (See Comments)    Tears skin  . Voltaren [Diclofenac Sodium]     DISCHARGE MEDICATIONS:   Current Discharge Medication List    START taking these medications   Details  ciprofloxacin (CIPRO) 500 MG tablet Take 1 tablet (500 mg total) by mouth 2 (two) times daily. Qty: 14 tablet, Refills: 0    docusate sodium (COLACE) 100 MG capsule Take 1 capsule (100 mg total) by mouth 2 (two) times daily. Qty: 10 capsule, Refills: 0    !! feeding supplement, ENSURE ENLIVE, (ENSURE ENLIVE) LIQD Take 237 mLs by mouth 3 (three) times daily between meals. Qty: 237 mL, Refills: 12    sulfamethoxazole-trimethoprim (BACTRIM DS,SEPTRA DS) 800-160 MG per tablet Take 1 tablet by mouth 2 (two) times daily. Qty: 14 tablet, Refills: 0     !! - Potential duplicate medications found. Please discuss with provider.    CONTINUE these medications which have CHANGED   Details  OLANZapine (ZYPREXA) 2.5 MG tablet Take 1 tablet (2.5 mg total) by mouth at bedtime. Qty: 10 tablet, Refills: 0      CONTINUE these medications which have NOT CHANGED   Details  acetaminophen (TYLENOL) 325 MG tablet Take 650 mg by mouth every 4 (four) hours as needed for moderate pain.    albuterol (PROVENTIL HFA;VENTOLIN HFA) 108 (90 BASE) MCG/ACT inhaler Inhale 2 puffs into the lungs 3 (three) times daily as needed for shortness of breath. Wait 1 minute between puffs.    albuterol (PROVENTIL) (2.5 MG/3ML) 0.083% nebulizer solution Take 2.5 mg by nebulization  every 6 (six) hours as needed for shortness of breath.    aspirin EC 81 MG tablet Take 81 mg by mouth daily.    Calcium Carbonate-Vitamin D 600-400 MG-UNIT per tablet Take 1 tablet by mouth daily.    Chlorpheniramine-Acetaminophen (CORICIDIN HBP COLD/FLU PO) Take 1 tablet by mouth every 6 (six) hours as needed (for cold symptoms).    cholecalciferol (VITAMIN D) 1000 UNITS tablet Take 1,000 Units by mouth daily.    citalopram (CELEXA) 20 MG tablet Take 20 mg by mouth 2 (two) times daily.    donepezil (ARICEPT) 5 MG tablet Take 5 mg by mouth daily.    enalapril (VASOTEC) 5 MG tablet Take 5 mg by mouth daily.    erythromycin ophthalmic ointment Place 1 application into the left eye 4 (four) times daily. Apply 0.25 inches in left eye four times a day.    Fluticasone-Salmeterol (ADVAIR) 250-50 MCG/DOSE AEPB Inhale 1 puff into the lungs 2 (two) times daily.    !! lactose free nutrition (BOOST) LIQD Take 237 mLs by mouth 3 (three) times daily as needed (when not eating).    levothyroxine (SYNTHROID, LEVOTHROID) 75 MCG tablet Take 75 mcg by mouth daily before breakfast.    loratadine (CLARITIN) 10 MG tablet Take 10 mg by mouth daily.    mineral oil-hydrophilic petrolatum (AQUAPHOR) ointment Apply 1 application topically 4 (four) times daily as needed for dry skin.    montelukast (SINGULAIR) 10 MG tablet Take 10 mg by mouth daily.    Multiple Vitamins-Minerals (PRESERVISION AREDS) CAPS Take 1 capsule by mouth 2 (two) times daily.    omeprazole (PRILOSEC) 20 MG capsule Take 20 mg by mouth 2 (two) times daily before a meal.    OXYGEN Inhale 2 L into the lungs. Run at 2L for 17 hours daily due to pulmonary htn and edema.    Polyethyl Glycol-Propyl Glycol 0.4-0.3 % SOLN Place 1 drop into both eyes as needed (for dry eyes).    SALINE NASAL SPRAY NA Place 1 spray into both nostrils every morning.    traMADol (ULTRAM) 50 MG tablet Take 50 mg by mouth every 6 (six) hours as needed for moderate  pain.    trospium (SANCTURA) 20 MG tablet Take 20 mg by mouth daily.     !! - Potential duplicate medications found. Please discuss with provider.       Today    VITAL SIGNS:  Blood pressure 120/72, pulse 84, temperature 98.2 F (36.8 C), temperature source Oral, resp. rate 18, height 5\' 5"  (1.651 m), weight 74.021 kg (163 lb 3 oz), SpO2 96 %.  I/O:   Intake/Output Summary (Last 24 hours) at 11/27/14 1129 Last data filed at 11/27/14 0800  Gross per 24 hour  Intake    480 ml  Output    150 ml  Net    330 ml    PHYSICAL EXAMINATION:  Physical Exam  GENERAL:  81 y.o.-year-old patient lying in the bed with no acute distress.  LUNGS: Normal breath sounds bilaterally, no wheezing, rales,rhonchi or crepitation. No use of accessory muscles of respiration.  CARDIOVASCULAR: S1, S2 normal. No murmurs, rubs, or gallops.  ABDOMEN: Soft, non-tender, non-distended. Bowel sounds present. No organomegaly or mass.  NEUROLOGIC: Moves all 4 extremities. PSYCHIATRIC: The patient is alert and awake. SKIN: No obvious rash, lesion, or ulcer.   DATA REVIEW:   CBC  Recent Labs Lab 11/25/14 0625 11/26/14 0532  WBC 8.5  --   HGB 10.4*  --   HCT 30.8*  --   PLT 112* 132*    Chemistries   Recent Labs Lab 11/24/14 0601 11/25/14 0625  NA 136 141  K 3.9 4.1  CL 106 111  CO2 24 25  GLUCOSE 96 100*  BUN 31* 29*  CREATININE 1.69* 1.43*  CALCIUM 8.2* 8.3*  AST 18  --   ALT 16  --   ALKPHOS 53  --   BILITOT 0.6  --     Cardiac Enzymes No results for input(s): TROPONINI in the last 168 hours.  Microbiology Results  Results for orders placed or performed during the hospital encounter of 11/23/14  Blood Culture (routine x 2)     Status: None   Collection Time: 11/23/14 10:43 AM  Result Value Ref Range Status   Specimen Description BLOOD  Final   Special Requests NONE  Final   Culture  Setup Time   Final    GRAM POSITIVE COCCI IN CHAINS AEROBIC BOTTLE ONLY CRITICAL RESULT  CALLED TO, READ BACK BY AND VERIFIED WITH: SHEA BREWER  AT 6283 CTJ    Culture   Final    GROUP A STREP (S.PYOGENES) ISOLATED IN BOTH AEROBIC AND ANAEROBIC BOTTLES    Report Status 11/26/2014 FINAL  Final   Organism ID, Bacteria GROUP A STREP (S.PYOGENES) ISOLATED  Final      Susceptibility   Group a strep (s.pyogenes) isolated - MIC (ETEST)*    PENICILLIN Value in next row Sensitive      SENSITIVE0.047    CEFTRIAXONE Value in next row Sensitive      SENSITIVE0.023    VANCOMYCIN Value in next row Sensitive      SENSITIVE0.50    LEVOFLOXACIN Value in next row Sensitive      SENSITIVE0.50    * GROUP A STREP (S.PYOGENES) ISOLATED  Blood Culture (routine x 2)     Status: None   Collection Time: 11/23/14 10:46 AM  Result Value Ref Range Status   Specimen Description BLOOD  Final   Special Requests NONE  Final   Culture  Setup Time   Final    GRAM POSITIVE COCCI IN CHAINS IN BOTH AEROBIC AND ANAEROBIC BOTTLES    Culture   Final    GROUP A STREP (S.PYOGENES) ISOLATED IN BOTH AEROBIC AND ANAEROBIC BOTTLES CRITICAL RESULT CALLED TO, READ BACK BY AND VERIFIED WITH: MEGAN OAKLEY @0340  11/24/14 BY AJO CONFIRMED BY [facility] REFER TO OTHER SET FOR SENSITIVITIES     Report Status 11/26/2014 FINAL  Final  Urine culture     Status: None   Collection Time: 11/23/14 10:47 AM  Result Value Ref Range Status   Specimen Description URINE, CATHETERIZED  Final   Special Requests NONE  Final   Culture   Final    >=100,000 COLONIES/mL ESCHERICHIA COLI 50,000 COLONIES/mL AEROCOCCUS URINAE    Report Status 11/26/2014 FINAL  Final   Organism  ID, Bacteria ESCHERICHIA COLI  Final      Susceptibility   Escherichia coli - MIC*    AMPICILLIN >=32 RESISTANT Resistant     CEFTAZIDIME <=1 SENSITIVE Sensitive     CEFAZOLIN 32 RESISTANT Resistant     CEFTRIAXONE <=1 SENSITIVE Sensitive     CIPROFLOXACIN >=4 RESISTANT Resistant     GENTAMICIN <=1 SENSITIVE Sensitive     IMIPENEM <=0.25 SENSITIVE Sensitive      TRIMETH/SULFA <=20 SENSITIVE Sensitive     CEFOXITIN 16 INTERMEDIATE Intermediate     * >=100,000 COLONIES/mL ESCHERICHIA COLI    RADIOLOGY:  Dg Chest 2 View  11/26/2014   CLINICAL DATA:  Hypoxia cold  EXAM: CHEST  2 VIEW  COMPARISON:  Multiple prior studies including 11/23/2014  FINDINGS: Mild cardiac enlargement stable. Vascular pattern within normal limits. Right lung is clear. There are numerous linear opacities in the left lower lobe which could again represent pneumonia. This is mildly more prominent when compared the prior study. There is also evidence of 5 cm rounded air-filled structure at the left base. This could represent either hiatal hernia or diaphragmatic hernia or eventration not appreciated on the prior study are multiple prior studies.  IMPRESSION: Again identified is left lower lobe infiltrate concerning for possible pneumonia with tiny associated effusion. Infiltrate is mildly worse when compared to 11/23/2014.  Hiatal hernia versus diaphragmatic hernia or eventration.   Electronically Signed   By: Skipper Cliche M.D.   On: 11/26/2014 14:43      Follow up with PCP in 1 week.  Management plans discussed with the patient, family and they are in agreement.  CODE STATUS: DNR/DNI    Code Status Orders        Start     Ordered   11/23/14 1200  Do not attempt resuscitation (DNR)   Continuous    Question Answer Comment  In the event of cardiac or respiratory ARREST Do not call a "code blue"   In the event of cardiac or respiratory ARREST Do not perform Intubation, CPR, defibrillation or ACLS   In the event of cardiac or respiratory ARREST Use medication by any route, position, wound care, and other measures to relive pain and suffering. May use oxygen, suction and manual treatment of airway obstruction as needed for comfort.      11/23/14 1202    Advance Directive Documentation        Most Recent Value   Type of Advance Directive  Out of facility DNR (pink MOST or  yellow form)   Pre-existing out of facility DNR order (yellow form or pink MOST form)     "MOST" Form in Place?        TOTAL TIME TAKING CARE OF THIS PATIENT ON DAY OF DISCHARGE: more than 30  minutes.    Hillary Bow R M.D on 11/27/2014 at 11:29 AM  Between 7am to 6pm - Pager - [phone]  After 6pm go to [email or web address] - password [email or web address] [facility]  Office  [phone]  CC: Primary care physician; [name], MD

## 2014-11-27 NOTE — Plan of Care (Signed)
Problem: Discharge Progression Outcomes Goal: Other Discharge Outcomes/Goals Outcome: Completed/Met Date Met:  11/27/14 MD making rounds. Discharge orders received. Sarah, Financial risk analyst transfer to [facility]. IV removed. Discharge paperwork provided for EMS with transport to facility. Report called to Jenny Reichmann, Therapist, sports at [facility]. No unanswered questions. Patient changed into transfer package. EMS paged regarding transport. Awaiting EMS. Family at bedside.

## 2014-12-01 DIAGNOSIS — N3000 Acute cystitis without hematuria: Secondary | ICD-10-CM | POA: Diagnosis not present

## 2014-12-01 DIAGNOSIS — E039 Hypothyroidism, unspecified: Secondary | ICD-10-CM | POA: Diagnosis not present

## 2014-12-01 DIAGNOSIS — I10 Essential (primary) hypertension: Secondary | ICD-10-CM | POA: Diagnosis not present

## 2014-12-01 DIAGNOSIS — J45909 Unspecified asthma, uncomplicated: Secondary | ICD-10-CM | POA: Diagnosis not present

## 2014-12-01 DIAGNOSIS — M150 Primary generalized (osteo)arthritis: Secondary | ICD-10-CM | POA: Diagnosis not present

## 2014-12-01 DIAGNOSIS — Z9981 Dependence on supplemental oxygen: Secondary | ICD-10-CM | POA: Diagnosis not present

## 2014-12-01 DIAGNOSIS — K219 Gastro-esophageal reflux disease without esophagitis: Secondary | ICD-10-CM | POA: Diagnosis not present

## 2014-12-01 DIAGNOSIS — F0390 Unspecified dementia without behavioral disturbance: Secondary | ICD-10-CM | POA: Diagnosis not present

## 2014-12-01 DIAGNOSIS — K5900 Constipation, unspecified: Secondary | ICD-10-CM | POA: Diagnosis not present

## 2014-12-09 DIAGNOSIS — F0390 Unspecified dementia without behavioral disturbance: Secondary | ICD-10-CM | POA: Diagnosis not present

## 2014-12-09 DIAGNOSIS — I10 Essential (primary) hypertension: Secondary | ICD-10-CM | POA: Diagnosis not present

## 2014-12-09 DIAGNOSIS — K219 Gastro-esophageal reflux disease without esophagitis: Secondary | ICD-10-CM | POA: Diagnosis not present

## 2014-12-09 DIAGNOSIS — M150 Primary generalized (osteo)arthritis: Secondary | ICD-10-CM | POA: Diagnosis not present

## 2014-12-09 DIAGNOSIS — N3000 Acute cystitis without hematuria: Secondary | ICD-10-CM | POA: Diagnosis not present

## 2014-12-09 DIAGNOSIS — J45909 Unspecified asthma, uncomplicated: Secondary | ICD-10-CM | POA: Diagnosis not present

## 2014-12-09 DIAGNOSIS — K5900 Constipation, unspecified: Secondary | ICD-10-CM | POA: Diagnosis not present

## 2014-12-09 DIAGNOSIS — Z9981 Dependence on supplemental oxygen: Secondary | ICD-10-CM | POA: Diagnosis not present

## 2014-12-09 DIAGNOSIS — E039 Hypothyroidism, unspecified: Secondary | ICD-10-CM | POA: Diagnosis not present

## 2014-12-16 DIAGNOSIS — D649 Anemia, unspecified: Secondary | ICD-10-CM | POA: Diagnosis not present

## 2014-12-16 DIAGNOSIS — N390 Urinary tract infection, site not specified: Secondary | ICD-10-CM | POA: Diagnosis not present

## 2014-12-16 DIAGNOSIS — E039 Hypothyroidism, unspecified: Secondary | ICD-10-CM | POA: Diagnosis not present

## 2014-12-16 DIAGNOSIS — E46 Unspecified protein-calorie malnutrition: Secondary | ICD-10-CM | POA: Diagnosis not present

## 2014-12-16 DIAGNOSIS — J189 Pneumonia, unspecified organism: Secondary | ICD-10-CM | POA: Diagnosis not present

## 2014-12-20 DIAGNOSIS — F329 Major depressive disorder, single episode, unspecified: Secondary | ICD-10-CM | POA: Diagnosis not present

## 2014-12-20 DIAGNOSIS — J45909 Unspecified asthma, uncomplicated: Secondary | ICD-10-CM | POA: Diagnosis not present

## 2014-12-20 DIAGNOSIS — Z8701 Personal history of pneumonia (recurrent): Secondary | ICD-10-CM | POA: Diagnosis not present

## 2014-12-20 DIAGNOSIS — Z7982 Long term (current) use of aspirin: Secondary | ICD-10-CM | POA: Diagnosis not present

## 2014-12-20 DIAGNOSIS — Z87440 Personal history of urinary (tract) infections: Secondary | ICD-10-CM | POA: Diagnosis not present

## 2014-12-20 DIAGNOSIS — M1990 Unspecified osteoarthritis, unspecified site: Secondary | ICD-10-CM | POA: Diagnosis not present

## 2014-12-20 DIAGNOSIS — F419 Anxiety disorder, unspecified: Secondary | ICD-10-CM | POA: Diagnosis not present

## 2014-12-20 DIAGNOSIS — Z9981 Dependence on supplemental oxygen: Secondary | ICD-10-CM | POA: Diagnosis not present

## 2014-12-20 DIAGNOSIS — N390 Urinary tract infection, site not specified: Secondary | ICD-10-CM | POA: Diagnosis not present

## 2014-12-20 DIAGNOSIS — I10 Essential (primary) hypertension: Secondary | ICD-10-CM | POA: Diagnosis not present

## 2014-12-20 DIAGNOSIS — F0391 Unspecified dementia with behavioral disturbance: Secondary | ICD-10-CM | POA: Diagnosis not present

## 2014-12-23 DIAGNOSIS — M79674 Pain in right toe(s): Secondary | ICD-10-CM | POA: Diagnosis not present

## 2014-12-23 DIAGNOSIS — M79675 Pain in left toe(s): Secondary | ICD-10-CM | POA: Diagnosis not present

## 2014-12-23 DIAGNOSIS — B351 Tinea unguium: Secondary | ICD-10-CM | POA: Diagnosis not present

## 2014-12-25 DIAGNOSIS — M1990 Unspecified osteoarthritis, unspecified site: Secondary | ICD-10-CM | POA: Diagnosis not present

## 2014-12-25 DIAGNOSIS — I10 Essential (primary) hypertension: Secondary | ICD-10-CM | POA: Diagnosis not present

## 2014-12-25 DIAGNOSIS — F0391 Unspecified dementia with behavioral disturbance: Secondary | ICD-10-CM | POA: Diagnosis not present

## 2014-12-25 DIAGNOSIS — F419 Anxiety disorder, unspecified: Secondary | ICD-10-CM | POA: Diagnosis not present

## 2014-12-25 DIAGNOSIS — F329 Major depressive disorder, single episode, unspecified: Secondary | ICD-10-CM | POA: Diagnosis not present

## 2014-12-25 DIAGNOSIS — J45909 Unspecified asthma, uncomplicated: Secondary | ICD-10-CM | POA: Diagnosis not present

## 2014-12-30 DIAGNOSIS — J45909 Unspecified asthma, uncomplicated: Secondary | ICD-10-CM | POA: Diagnosis not present

## 2014-12-30 DIAGNOSIS — F419 Anxiety disorder, unspecified: Secondary | ICD-10-CM | POA: Diagnosis not present

## 2014-12-30 DIAGNOSIS — F0391 Unspecified dementia with behavioral disturbance: Secondary | ICD-10-CM | POA: Diagnosis not present

## 2014-12-30 DIAGNOSIS — F329 Major depressive disorder, single episode, unspecified: Secondary | ICD-10-CM | POA: Diagnosis not present

## 2014-12-30 DIAGNOSIS — M1990 Unspecified osteoarthritis, unspecified site: Secondary | ICD-10-CM | POA: Diagnosis not present

## 2014-12-30 DIAGNOSIS — I10 Essential (primary) hypertension: Secondary | ICD-10-CM | POA: Diagnosis not present

## 2014-12-31 DIAGNOSIS — I129 Hypertensive chronic kidney disease with stage 1 through stage 4 chronic kidney disease, or unspecified chronic kidney disease: Secondary | ICD-10-CM | POA: Diagnosis not present

## 2014-12-31 DIAGNOSIS — Z0001 Encounter for general adult medical examination with abnormal findings: Secondary | ICD-10-CM | POA: Diagnosis not present

## 2014-12-31 DIAGNOSIS — R262 Difficulty in walking, not elsewhere classified: Secondary | ICD-10-CM | POA: Diagnosis not present

## 2014-12-31 DIAGNOSIS — F0280 Dementia in other diseases classified elsewhere without behavioral disturbance: Secondary | ICD-10-CM | POA: Diagnosis not present

## 2014-12-31 DIAGNOSIS — N390 Urinary tract infection, site not specified: Secondary | ICD-10-CM | POA: Diagnosis not present

## 2014-12-31 DIAGNOSIS — J189 Pneumonia, unspecified organism: Secondary | ICD-10-CM | POA: Diagnosis not present

## 2014-12-31 DIAGNOSIS — J449 Chronic obstructive pulmonary disease, unspecified: Secondary | ICD-10-CM | POA: Diagnosis not present

## 2015-01-01 DIAGNOSIS — F419 Anxiety disorder, unspecified: Secondary | ICD-10-CM | POA: Diagnosis not present

## 2015-01-01 DIAGNOSIS — F0391 Unspecified dementia with behavioral disturbance: Secondary | ICD-10-CM | POA: Diagnosis not present

## 2015-01-01 DIAGNOSIS — M1990 Unspecified osteoarthritis, unspecified site: Secondary | ICD-10-CM | POA: Diagnosis not present

## 2015-01-01 DIAGNOSIS — F329 Major depressive disorder, single episode, unspecified: Secondary | ICD-10-CM | POA: Diagnosis not present

## 2015-01-01 DIAGNOSIS — J45909 Unspecified asthma, uncomplicated: Secondary | ICD-10-CM | POA: Diagnosis not present

## 2015-01-01 DIAGNOSIS — I10 Essential (primary) hypertension: Secondary | ICD-10-CM | POA: Diagnosis not present

## 2015-01-04 ENCOUNTER — Ambulatory Visit
Admission: RE | Admit: 2015-01-04 | Discharge: 2015-01-04 | Disposition: A | Discharge status: Home / Self Care | Payer: Medicare Other | Source: Ambulatory Visit | Attending: Physician Assistant | Admitting: Physician Assistant

## 2015-01-04 ENCOUNTER — Other Ambulatory Visit: Payer: Self-pay | Admitting: Physician Assistant

## 2015-01-04 DIAGNOSIS — J189 Pneumonia, unspecified organism: Secondary | ICD-10-CM

## 2015-01-04 DIAGNOSIS — J45909 Unspecified asthma, uncomplicated: Secondary | ICD-10-CM | POA: Diagnosis not present

## 2015-01-04 DIAGNOSIS — F0391 Unspecified dementia with behavioral disturbance: Secondary | ICD-10-CM | POA: Diagnosis not present

## 2015-01-04 DIAGNOSIS — F329 Major depressive disorder, single episode, unspecified: Secondary | ICD-10-CM | POA: Diagnosis not present

## 2015-01-04 DIAGNOSIS — F419 Anxiety disorder, unspecified: Secondary | ICD-10-CM | POA: Diagnosis not present

## 2015-01-04 DIAGNOSIS — J449 Chronic obstructive pulmonary disease, unspecified: Secondary | ICD-10-CM | POA: Diagnosis not present

## 2015-01-04 DIAGNOSIS — M1990 Unspecified osteoarthritis, unspecified site: Secondary | ICD-10-CM | POA: Diagnosis not present

## 2015-01-04 DIAGNOSIS — I10 Essential (primary) hypertension: Secondary | ICD-10-CM | POA: Diagnosis not present

## 2015-01-06 DIAGNOSIS — M1990 Unspecified osteoarthritis, unspecified site: Secondary | ICD-10-CM | POA: Diagnosis not present

## 2015-01-06 DIAGNOSIS — J45909 Unspecified asthma, uncomplicated: Secondary | ICD-10-CM | POA: Diagnosis not present

## 2015-01-06 DIAGNOSIS — F0391 Unspecified dementia with behavioral disturbance: Secondary | ICD-10-CM | POA: Diagnosis not present

## 2015-01-06 DIAGNOSIS — F419 Anxiety disorder, unspecified: Secondary | ICD-10-CM | POA: Diagnosis not present

## 2015-01-06 DIAGNOSIS — F329 Major depressive disorder, single episode, unspecified: Secondary | ICD-10-CM | POA: Diagnosis not present

## 2015-01-06 DIAGNOSIS — I10 Essential (primary) hypertension: Secondary | ICD-10-CM | POA: Diagnosis not present

## 2015-01-08 ENCOUNTER — Emergency Department: Payer: Medicare Other

## 2015-01-08 ENCOUNTER — Inpatient Hospital Stay
Admission: EM | Admit: 2015-01-08 | Discharge: 2015-01-12 | DRG: 481 | Disposition: A | Discharge status: Skilled Nursing Facility | Payer: Medicare Other | Attending: Internal Medicine | Admitting: Internal Medicine

## 2015-01-08 DIAGNOSIS — Z01818 Encounter for other preprocedural examination: Secondary | ICD-10-CM | POA: Diagnosis not present

## 2015-01-08 DIAGNOSIS — M1990 Unspecified osteoarthritis, unspecified site: Secondary | ICD-10-CM | POA: Diagnosis present

## 2015-01-08 DIAGNOSIS — I2510 Atherosclerotic heart disease of native coronary artery without angina pectoris: Secondary | ICD-10-CM | POA: Diagnosis not present

## 2015-01-08 DIAGNOSIS — S72001A Fracture of unspecified part of neck of right femur, initial encounter for closed fracture: Secondary | ICD-10-CM | POA: Diagnosis not present

## 2015-01-08 DIAGNOSIS — Z8249 Family history of ischemic heart disease and other diseases of the circulatory system: Secondary | ICD-10-CM

## 2015-01-08 DIAGNOSIS — Z886 Allergy status to analgesic agent status: Secondary | ICD-10-CM

## 2015-01-08 DIAGNOSIS — W1830XA Fall on same level, unspecified, initial encounter: Secondary | ICD-10-CM | POA: Diagnosis present

## 2015-01-08 DIAGNOSIS — J449 Chronic obstructive pulmonary disease, unspecified: Secondary | ICD-10-CM | POA: Diagnosis present

## 2015-01-08 DIAGNOSIS — J45909 Unspecified asthma, uncomplicated: Secondary | ICD-10-CM | POA: Diagnosis not present

## 2015-01-08 DIAGNOSIS — D62 Acute posthemorrhagic anemia: Secondary | ICD-10-CM | POA: Diagnosis not present

## 2015-01-08 DIAGNOSIS — R1312 Dysphagia, oropharyngeal phase: Secondary | ICD-10-CM | POA: Diagnosis not present

## 2015-01-08 DIAGNOSIS — N184 Chronic kidney disease, stage 4 (severe): Secondary | ICD-10-CM | POA: Diagnosis present

## 2015-01-08 DIAGNOSIS — Z9849 Cataract extraction status, unspecified eye: Secondary | ICD-10-CM | POA: Diagnosis not present

## 2015-01-08 DIAGNOSIS — Z79899 Other long term (current) drug therapy: Secondary | ICD-10-CM

## 2015-01-08 DIAGNOSIS — R262 Difficulty in walking, not elsewhere classified: Secondary | ICD-10-CM | POA: Diagnosis not present

## 2015-01-08 DIAGNOSIS — N3281 Overactive bladder: Secondary | ICD-10-CM | POA: Diagnosis not present

## 2015-01-08 DIAGNOSIS — Z90710 Acquired absence of both cervix and uterus: Secondary | ICD-10-CM

## 2015-01-08 DIAGNOSIS — F0390 Unspecified dementia without behavioral disturbance: Secondary | ICD-10-CM | POA: Diagnosis present

## 2015-01-08 DIAGNOSIS — Z888 Allergy status to other drugs, medicaments and biological substances status: Secondary | ICD-10-CM | POA: Diagnosis not present

## 2015-01-08 DIAGNOSIS — I10 Essential (primary) hypertension: Secondary | ICD-10-CM | POA: Diagnosis not present

## 2015-01-08 DIAGNOSIS — Z7951 Long term (current) use of inhaled steroids: Secondary | ICD-10-CM | POA: Diagnosis not present

## 2015-01-08 DIAGNOSIS — Z8673 Personal history of transient ischemic attack (TIA), and cerebral infarction without residual deficits: Secondary | ICD-10-CM | POA: Diagnosis not present

## 2015-01-08 DIAGNOSIS — S79911A Unspecified injury of right hip, initial encounter: Secondary | ICD-10-CM | POA: Diagnosis not present

## 2015-01-08 DIAGNOSIS — F419 Anxiety disorder, unspecified: Secondary | ICD-10-CM | POA: Diagnosis not present

## 2015-01-08 DIAGNOSIS — Z66 Do not resuscitate: Secondary | ICD-10-CM | POA: Diagnosis present

## 2015-01-08 DIAGNOSIS — D638 Anemia in other chronic diseases classified elsewhere: Secondary | ICD-10-CM | POA: Diagnosis not present

## 2015-01-08 DIAGNOSIS — M25551 Pain in right hip: Secondary | ICD-10-CM | POA: Diagnosis not present

## 2015-01-08 DIAGNOSIS — K219 Gastro-esophageal reflux disease without esophagitis: Secondary | ICD-10-CM | POA: Diagnosis present

## 2015-01-08 DIAGNOSIS — Z9049 Acquired absence of other specified parts of digestive tract: Secondary | ICD-10-CM | POA: Diagnosis present

## 2015-01-08 DIAGNOSIS — Z801 Family history of malignant neoplasm of trachea, bronchus and lung: Secondary | ICD-10-CM | POA: Diagnosis not present

## 2015-01-08 DIAGNOSIS — B309 Viral conjunctivitis, unspecified: Secondary | ICD-10-CM | POA: Diagnosis not present

## 2015-01-08 DIAGNOSIS — N179 Acute kidney failure, unspecified: Secondary | ICD-10-CM | POA: Diagnosis present

## 2015-01-08 DIAGNOSIS — R41841 Cognitive communication deficit: Secondary | ICD-10-CM | POA: Diagnosis not present

## 2015-01-08 DIAGNOSIS — Z7982 Long term (current) use of aspirin: Secondary | ICD-10-CM | POA: Diagnosis not present

## 2015-01-08 DIAGNOSIS — I959 Hypotension, unspecified: Secondary | ICD-10-CM | POA: Diagnosis present

## 2015-01-08 DIAGNOSIS — S72141A Displaced intertrochanteric fracture of right femur, initial encounter for closed fracture: Principal | ICD-10-CM | POA: Diagnosis present

## 2015-01-08 DIAGNOSIS — Z9889 Other specified postprocedural states: Secondary | ICD-10-CM | POA: Diagnosis not present

## 2015-01-08 DIAGNOSIS — Z09 Encounter for follow-up examination after completed treatment for conditions other than malignant neoplasm: Secondary | ICD-10-CM

## 2015-01-08 DIAGNOSIS — I129 Hypertensive chronic kidney disease with stage 1 through stage 4 chronic kidney disease, or unspecified chronic kidney disease: Secondary | ICD-10-CM | POA: Diagnosis present

## 2015-01-08 DIAGNOSIS — Z8701 Personal history of pneumonia (recurrent): Secondary | ICD-10-CM | POA: Diagnosis not present

## 2015-01-08 DIAGNOSIS — F329 Major depressive disorder, single episode, unspecified: Secondary | ICD-10-CM | POA: Diagnosis not present

## 2015-01-08 DIAGNOSIS — Z800 Family history of malignant neoplasm of digestive organs: Secondary | ICD-10-CM | POA: Diagnosis not present

## 2015-01-08 DIAGNOSIS — E038 Other specified hypothyroidism: Secondary | ICD-10-CM | POA: Diagnosis not present

## 2015-01-08 DIAGNOSIS — W19XXXA Unspecified fall, initial encounter: Secondary | ICD-10-CM | POA: Diagnosis not present

## 2015-01-08 DIAGNOSIS — M6281 Muscle weakness (generalized): Secondary | ICD-10-CM | POA: Diagnosis not present

## 2015-01-08 DIAGNOSIS — Z5189 Encounter for other specified aftercare: Secondary | ICD-10-CM | POA: Diagnosis not present

## 2015-01-08 DIAGNOSIS — M25559 Pain in unspecified hip: Secondary | ICD-10-CM | POA: Diagnosis not present

## 2015-01-08 DIAGNOSIS — F0391 Unspecified dementia with behavioral disturbance: Secondary | ICD-10-CM | POA: Diagnosis not present

## 2015-01-08 HISTORY — DX: Chronic kidney disease, unspecified: N18.9

## 2015-01-08 HISTORY — DX: Cerebral infarction, unspecified: I63.9

## 2015-01-08 LAB — CBC WITH DIFFERENTIAL/PLATELET
Basophils Absolute: 0 10*3/uL (ref 0–0.1)
Basophils Relative: 1 %
Eosinophils Absolute: 0.1 10*3/uL (ref 0–0.7)
Eosinophils Relative: 2 %
HCT: 30.3 % — ABNORMAL LOW (ref 35.0–47.0)
Hemoglobin: 10.1 g/dL — ABNORMAL LOW (ref 12.0–16.0)
LYMPHS ABS: 1.9 10*3/uL (ref 1.0–3.6)
Lymphocytes Relative: 32 %
MCH: 31.5 pg (ref 26.0–34.0)
MCHC: 33.2 g/dL (ref 32.0–36.0)
MCV: 94.9 fL (ref 80.0–100.0)
Monocytes Absolute: 0.4 10*3/uL (ref 0.2–0.9)
Monocytes Relative: 7 %
NEUTROS ABS: 3.4 10*3/uL (ref 1.4–6.5)
NEUTROS PCT: 58 %
PLATELETS: 149 10*3/uL — AB (ref 150–440)
RBC: 3.19 MIL/uL — ABNORMAL LOW (ref 3.80–5.20)
RDW: 14.2 % (ref 11.5–14.5)
WBC: 5.9 10*3/uL (ref 3.6–11.0)

## 2015-01-08 LAB — URINALYSIS COMPLETE WITH MICROSCOPIC (ARMC ONLY)
BACTERIA UA: NONE SEEN
BILIRUBIN URINE: NEGATIVE
Glucose, UA: NEGATIVE mg/dL
HGB URINE DIPSTICK: NEGATIVE
Ketones, ur: NEGATIVE mg/dL
NITRITE: NEGATIVE
PH: 6 (ref 5.0–8.0)
Protein, ur: NEGATIVE mg/dL
SPECIFIC GRAVITY, URINE: 1.012 (ref 1.005–1.030)

## 2015-01-08 LAB — BASIC METABOLIC PANEL
Anion gap: 7 (ref 5–15)
BUN: 15 mg/dL (ref 6–20)
CO2: 27 mmol/L (ref 22–32)
Calcium: 9.1 mg/dL (ref 8.9–10.3)
Chloride: 104 mmol/L (ref 101–111)
Creatinine, Ser: 1.87 mg/dL — ABNORMAL HIGH (ref 0.44–1.00)
GFR calc Af Amer: 26 mL/min — ABNORMAL LOW (ref >60)
GFR calc non Af Amer: 23 mL/min — ABNORMAL LOW (ref >60)
GLUCOSE: 109 mg/dL — AB (ref 65–99)
Potassium: 4.1 mmol/L (ref 3.5–5.1)
Sodium: 138 mmol/L (ref 135–145)

## 2015-01-08 LAB — TROPONIN I: Troponin I: <0.03 ng/mL (ref <0.031)

## 2015-01-08 LAB — PREPARE RBC (CROSSMATCH)

## 2015-01-08 LAB — PROTIME-INR
INR: 0.97
PROTHROMBIN TIME: 13.1 s (ref 11.4–15.0)

## 2015-01-08 LAB — ALBUMIN: ALBUMIN: 3.7 g/dL (ref 3.5–5.0)

## 2015-01-08 LAB — ABO/RH: ABO/RH(D): A POS

## 2015-01-08 LAB — APTT: APTT: 25 s (ref 24–36)

## 2015-01-08 LAB — CALCIUM: CALCIUM: 9.1 mg/dL (ref 8.9–10.3)

## 2015-01-08 MED ORDER — ALBUTEROL SULFATE HFA 108 (90 BASE) MCG/ACT IN AERS: 2.0000 | INHALATION_SPRAY | Freq: Three times a day (TID) | RESPIRATORY_TRACT | Status: DC | PRN

## 2015-01-08 MED ORDER — ACETAMINOPHEN 650 MG RE SUPP: 650.0000 mg | Freq: Four times a day (QID) | RECTAL | Status: DC | PRN

## 2015-01-08 MED ORDER — CEFAZOLIN SODIUM-DEXTROSE 2-3 GM-% IV SOLR
2.0000 g | Freq: Once | INTRAVENOUS | Status: DC
  Filled 2015-01-08: qty 50

## 2015-01-08 MED ORDER — ALBUTEROL SULFATE (2.5 MG/3ML) 0.083% IN NEBU: 2.5000 mg | INHALATION_SOLUTION | RESPIRATORY_TRACT | Status: DC | PRN

## 2015-01-08 MED ORDER — ENALAPRIL MALEATE 5 MG PO TABS
5.0000 mg | ORAL_TABLET | Freq: Every day | ORAL | Status: DC
  Administered 2015-01-10 – 2015-01-12 (×3): 5 mg via ORAL
  Filled 2015-01-08 (×3): qty 1

## 2015-01-08 MED ORDER — ONDANSETRON HCL 4 MG/2ML IJ SOLN
4.0000 mg | Freq: Four times a day (QID) | INTRAMUSCULAR | Status: DC | PRN
  Administered 2015-01-08 – 2015-01-11 (×2): 4 mg via INTRAVENOUS
  Filled 2015-01-08 (×2): qty 2

## 2015-01-08 MED ORDER — TRAMADOL HCL 50 MG PO TABS: 50.0000 mg | ORAL_TABLET | Freq: Four times a day (QID) | ORAL | Status: DC | PRN

## 2015-01-08 MED ORDER — METHOCARBAMOL 500 MG PO TABS
500.0000 mg | ORAL_TABLET | Freq: Four times a day (QID) | ORAL | Status: DC | PRN
  Administered 2015-01-11: 500 mg via ORAL
  Filled 2015-01-08: qty 1

## 2015-01-08 MED ORDER — ZOLPIDEM TARTRATE 5 MG PO TABS
5.0000 mg | ORAL_TABLET | Freq: Every evening | ORAL | Status: DC | PRN
  Administered 2015-01-08 – 2015-01-11 (×4): 5 mg via ORAL
  Filled 2015-01-08 (×4): qty 1

## 2015-01-08 MED ORDER — PANTOPRAZOLE SODIUM 40 MG PO TBEC
40.0000 mg | DELAYED_RELEASE_TABLET | Freq: Every day | ORAL | Status: DC
  Administered 2015-01-10 – 2015-01-12 (×3): 40 mg via ORAL
  Filled 2015-01-08 (×4): qty 1

## 2015-01-08 MED ORDER — DOCUSATE SODIUM 100 MG PO CAPS
100.0000 mg | ORAL_CAPSULE | Freq: Two times a day (BID) | ORAL | Status: DC
  Administered 2015-01-08 – 2015-01-12 (×7): 100 mg via ORAL
  Filled 2015-01-08 (×7): qty 1

## 2015-01-08 MED ORDER — FENTANYL CITRATE (PF) 100 MCG/2ML IJ SOLN
25.0000 ug | Freq: Once | INTRAMUSCULAR | Status: AC
  Administered 2015-01-08: 25 ug via INTRAVENOUS
  Filled 2015-01-08: qty 2

## 2015-01-08 MED ORDER — LEVOTHYROXINE SODIUM 75 MCG PO TABS
75.0000 ug | ORAL_TABLET | Freq: Every day | ORAL | Status: DC
  Administered 2015-01-10 – 2015-01-12 (×3): 75 ug via ORAL
  Filled 2015-01-08 (×3): qty 1

## 2015-01-08 MED ORDER — METHOCARBAMOL 1000 MG/10ML IJ SOLN: 500.0000 mg | Freq: Four times a day (QID) | INTRAVENOUS | Status: DC | PRN

## 2015-01-08 MED ORDER — MONTELUKAST SODIUM 10 MG PO TABS
10.0000 mg | ORAL_TABLET | Freq: Every day | ORAL | Status: DC
  Administered 2015-01-10 – 2015-01-12 (×3): 10 mg via ORAL
  Filled 2015-01-08 (×3): qty 1

## 2015-01-08 MED ORDER — BOOST PO LIQD: 237.0000 mL | Freq: Two times a day (BID) | ORAL | Status: DC | PRN

## 2015-01-08 MED ORDER — ONDANSETRON HCL 4 MG PO TABS: 4.0000 mg | ORAL_TABLET | Freq: Four times a day (QID) | ORAL | Status: DC | PRN

## 2015-01-08 MED ORDER — SENNA 8.6 MG PO TABS
1.0000 | ORAL_TABLET | Freq: Two times a day (BID) | ORAL | Status: DC
  Administered 2015-01-08 – 2015-01-12 (×7): 8.6 mg via ORAL
  Filled 2015-01-08 (×7): qty 1

## 2015-01-08 MED ORDER — SODIUM CHLORIDE 0.9 % IV SOLN
INTRAVENOUS | Status: DC
  Administered 2015-01-08: 17:00:00 via INTRAVENOUS

## 2015-01-08 MED ORDER — CLINDAMYCIN PHOSPHATE 900 MG/50ML IV SOLN
900.0000 mg | Freq: Once | INTRAVENOUS | Status: DC
  Filled 2015-01-08: qty 50

## 2015-01-08 MED ORDER — MORPHINE SULFATE 2 MG/ML IJ SOLN
0.5000 mg | INTRAMUSCULAR | Status: DC | PRN
  Administered 2015-01-08 (×2): 0.5 mg via INTRAVENOUS
  Filled 2015-01-08 (×2): qty 1

## 2015-01-08 MED ORDER — CITALOPRAM HYDROBROMIDE 20 MG PO TABS
20.0000 mg | ORAL_TABLET | Freq: Two times a day (BID) | ORAL | Status: DC
  Administered 2015-01-09 – 2015-01-12 (×6): 20 mg via ORAL
  Filled 2015-01-08 (×6): qty 1

## 2015-01-08 MED ORDER — ACETAMINOPHEN 325 MG PO TABS
650.0000 mg | ORAL_TABLET | Freq: Four times a day (QID) | ORAL | Status: DC | PRN
  Filled 2015-01-08: qty 2

## 2015-01-08 MED ORDER — SODIUM CHLORIDE 0.9 % IV SOLN: Freq: Once | INTRAVENOUS | Status: DC

## 2015-01-08 MED ORDER — DONEPEZIL HCL 5 MG PO TABS
5.0000 mg | ORAL_TABLET | Freq: Every day | ORAL | Status: DC
  Administered 2015-01-10 – 2015-01-12 (×3): 5 mg via ORAL
  Filled 2015-01-08 (×3): qty 1

## 2015-01-08 MED ORDER — HYDROCODONE-ACETAMINOPHEN 5-325 MG PO TABS
1.0000 | ORAL_TABLET | Freq: Four times a day (QID) | ORAL | Status: DC | PRN
  Administered 2015-01-08 (×2): 1 via ORAL
  Filled 2015-01-08 (×4): qty 1

## 2015-01-08 MED ORDER — OLANZAPINE 2.5 MG PO TABS
2.5000 mg | ORAL_TABLET | Freq: Every day | ORAL | Status: DC
  Administered 2015-01-08 – 2015-01-11 (×4): 2.5 mg via ORAL
  Filled 2015-01-08 (×5): qty 1

## 2015-01-08 MED ORDER — ALBUTEROL SULFATE (2.5 MG/3ML) 0.083% IN NEBU: 2.5000 mg | INHALATION_SOLUTION | Freq: Four times a day (QID) | RESPIRATORY_TRACT | Status: DC | PRN

## 2015-01-08 MED ORDER — ERYTHROMYCIN 5 MG/GM OP OINT
1.0000 "application " | TOPICAL_OINTMENT | Freq: Four times a day (QID) | OPHTHALMIC | Status: DC
  Administered 2015-01-08 – 2015-01-12 (×12): 1 via OPHTHALMIC
  Filled 2015-01-08: qty 3.5

## 2015-01-08 MED ORDER — LORATADINE 10 MG PO TABS
10.0000 mg | ORAL_TABLET | Freq: Every day | ORAL | Status: DC
  Administered 2015-01-10 – 2015-01-12 (×3): 10 mg via ORAL
  Filled 2015-01-08 (×3): qty 1

## 2015-01-08 MED ORDER — MOMETASONE FURO-FORMOTEROL FUM 100-5 MCG/ACT IN AERO
2.0000 | INHALATION_SPRAY | Freq: Two times a day (BID) | RESPIRATORY_TRACT | Status: DC
  Administered 2015-01-08 – 2015-01-12 (×7): 2 via RESPIRATORY_TRACT
  Filled 2015-01-08: qty 8.8

## 2015-01-08 NOTE — ED Notes (Signed)
Pt to ED from [facility] [address] after mechanical fall. Pt c/o right upper leg pain, right leg shortened but not rotated. VSS.

## 2015-01-08 NOTE — ED Provider Notes (Signed)
[facility] Emergency Department Provider Note  ____________________________________________  Time seen: Approximately 310 PM  I have reviewed the triage vital signs and the nursing notes.   HISTORY  Chief Complaint Fall    HPI Christina Hahn is a 81 y.o. female with a history of dementia who presents today with a fall at her nursing home. The patient was witnessed to have lost her balance and fallen on her right side. She is now complaining of pain to her right hip. She was sent to the emergency department for further evaluation. The patient does not note any pain to her head. No reports of loss of consciousness. Pain is sharp and constant. Noted to have shortening of the right leg without rotation.   Past Medical History  Diagnosis Date  . Arthritis   . Hypertension   . GERD (gastroesophageal reflux disease)   . Depression   . Eczema   . Asthma   . Anxiety   . Dementia   . Thyroid disease     Patient Active Problem List   Diagnosis Date Noted  . Group A streptococcal infection 11/27/2014  . E-coli UTI 11/27/2014  . CAP (community acquired pneumonia) 11/27/2014  . Bacteremia 11/24/2014  . Acute renal failure 11/24/2014  . Urinary incontinence 11/23/2014  . Seizure disorder 11/23/2014  . TIA (transient ischemic attack) 11/23/2014  . H/O: CVA (cerebrovascular accident) 11/23/2014  . CKD (chronic kidney disease) 11/23/2014  . Asthma 11/23/2014  . Ovarian cancer 11/23/2014  . Gastric ulcer 11/23/2014  . Depression 11/23/2014  . Hypothyroid 11/23/2014  . Hypertension 11/23/2014  . Dementia 11/23/2014  . Fever 11/23/2014    History reviewed. No pertinent past surgical history.  Current Outpatient Rx  Name  Route  Sig  Dispense  Refill  . acetaminophen (TYLENOL) 325 MG tablet   Oral   Take 650 mg by mouth every 4 (four) hours as needed for mild pain.          Marland Kitchen albuterol (PROVENTIL HFA;VENTOLIN HFA) 108 (90 BASE) MCG/ACT inhaler    Inhalation   Inhale 2 puffs into the lungs every 8 (eight) hours as needed for shortness of breath.          Marland Kitchen albuterol (PROVENTIL) (2.5 MG/3ML) 0.083% nebulizer solution   Nebulization   Take 2.5 mg by nebulization every 6 (six) hours as needed for shortness of breath.         Marland Kitchen aspirin 81 MG chewable tablet   Oral   Chew 81 mg by mouth daily.         . Calcium Carbonate-Vitamin D 600-400 MG-UNIT per tablet   Oral   Take 1 tablet by mouth daily.         . ciprofloxacin (CIPRO) 500 MG tablet   Oral   Take 500 mg by mouth 2 (two) times daily.         . citalopram (CELEXA) 20 MG tablet   Oral   Take 20 mg by mouth 2 (two) times daily.         Marland Kitchen docusate sodium (COLACE) 100 MG capsule   Oral   Take 1 capsule (100 mg total) by mouth 2 (two) times daily.   10 capsule   0   . donepezil (ARICEPT) 5 MG tablet   Oral   Take 5 mg by mouth daily.         . enalapril (VASOTEC) 5 MG tablet   Oral   Take 5 mg by mouth  daily.         . erythromycin ophthalmic ointment   Left Eye   Place 1 application into the left eye 4 (four) times daily.          . Fluticasone-Salmeterol (ADVAIR) 250-50 MCG/DOSE AEPB   Inhalation   Inhale 1 puff into the lungs 2 (two) times daily.         Marland Kitchen lactose free nutrition (BOOST) LIQD   Oral   Take 237 mLs by mouth 2 (two) times daily as needed (when pt is not eating well).          Marland Kitchen levothyroxine (SYNTHROID, LEVOTHROID) 75 MCG tablet   Oral   Take 75 mcg by mouth daily.          Marland Kitchen loratadine (CLARITIN) 10 MG tablet   Oral   Take 10 mg by mouth daily.         . montelukast (SINGULAIR) 10 MG tablet   Oral   Take 10 mg by mouth daily.         . Multiple Vitamin (THEREMS) TABS   Oral   Take 1 tablet by mouth 2 (two) times daily.         Marland Kitchen OLANZapine (ZYPREXA) 2.5 MG tablet   Oral   Take 1 tablet (2.5 mg total) by mouth at bedtime.   10 tablet   0   . omeprazole (PRILOSEC) 20 MG capsule   Oral   Take 20  mg by mouth 2 (two) times daily.          Vladimir Faster Glycol-Propyl Glycol (SYSTANE OP)   Ophthalmic   Apply 1 drop to eye 2 (two) times daily as needed (for dry eyes).         . traMADol (ULTRAM) 50 MG tablet   Oral   Take 50 mg by mouth every 6 (six) hours as needed for moderate pain.         . trospium (SANCTURA) 20 MG tablet   Oral   Take 20 mg by mouth daily.         . ciprofloxacin (CIPRO) 500 MG tablet   Oral   Take 1 tablet (500 mg total) by mouth 2 (two) times daily. Patient not taking: Reported on 01/08/2015   14 tablet   0   . feeding supplement, ENSURE ENLIVE, (ENSURE ENLIVE) LIQD   Oral   Take 237 mLs by mouth 3 (three) times daily between meals. Patient not taking: Reported on 01/08/2015   237 mL   12   . sulfamethoxazole-trimethoprim (BACTRIM DS,SEPTRA DS) 800-160 MG per tablet   Oral   Take 1 tablet by mouth 2 (two) times daily. Patient not taking: Reported on 01/08/2015   14 tablet   0     Allergies Antivert; Codeine; Dramamine; Flonase; Nsaids; Penicillins; Shrimp; Tape; and Voltaren  No family history on file.  Social History History  Substance Use Topics  . Smoking status: Never Smoker   . Smokeless tobacco: Not on file  . Alcohol Use: No    Review of Systems Constitutional: No fever/chills Eyes: No visual changes. ENT: No sore throat. Cardiovascular: Denies chest pain. Respiratory: Denies shortness of breath. Gastrointestinal: No abdominal pain.  No nausea, no vomiting.  No diarrhea.  No constipation. Genitourinary: Negative for dysuria. Musculoskeletal: Negative for back pain. Skin: Negative for rash. Neurological: Negative for headaches, focal weakness or numbness.  10-point ROS otherwise negative.  ____________________________________________   PHYSICAL EXAM:  VITAL SIGNS: ED Triage Vitals  Enc Vitals Group     BP 01/08/15 1502 129/45 mmHg     Pulse Rate 01/08/15 1502 59     Resp 01/08/15 1502 20     Temp 01/08/15  1502 98.6 F (37 C)     Temp Source 01/08/15 1502 Oral     SpO2 01/08/15 1502 98 %     Weight 01/08/15 1502 158 lb (71.668 kg)     Height 01/08/15 1502 5\' 5"  (1.651 m)     Head Cir --      Peak Flow --      Pain Score --      Pain Loc --      Pain Edu? --      Excl. in [place]? --     Constitutional: Alert but not oriented to year. Well appearing and in no acute distress. Eyes: Conjunctivae are normal. PERRL. EOMI. Head: Atraumatic. Nose: No congestion/rhinnorhea. Mouth/Throat: Mucous membranes are moist.  Oropharynx non-erythematous. Neck: No stridor.   Cardiovascular: Normal rate, regular rhythm. Grossly normal heart sounds.  Good peripheral circulation. Respiratory: Normal respiratory effort.  No retractions. Lungs CTAB. Gastrointestinal: Soft and nontender. No distention. No abdominal bruits. No CVA tenderness. Musculoskeletal: Right-sided lateral hip and proximal femur tenderness to palpation. There is no ecchymosis or deformity. The patient has decreased range of motion secondary to pain. She has bilateral dorsalis pedis pulses. She has intact range of motion, active, to her toes and ankles bilaterally. Mild edema to the bilateral lower extremities.  No joint effusions. Neurologic:  Normal speech and language. No gross focal neurologic deficits are appreciated. No gait instability. Skin:  Skin is warm, dry and intact. No rash noted. Psychiatric: Mood and affect are normal. Speech and behavior are normal.  ____________________________________________   LABS (all labs ordered are listed, but only abnormal results are displayed)  Labs Reviewed  CBC WITH DIFFERENTIAL/PLATELET - Abnormal; Notable for the following:    RBC 3.19 (*)    Hemoglobin 10.1 (*)    HCT 30.3 (*)    Platelets 149 (*)    All other components within normal limits  BASIC METABOLIC PANEL - Abnormal; Notable for the following:    Glucose, Bld 109 (*)    Creatinine, Ser 1.87 (*)    GFR calc non Af Amer 23 (*)     GFR calc Af Amer 26 (*)    All other components within normal limits  TROPONIN I  PROTIME-INR  APTT   ____________________________________________  EKG  ED ECG REPORT I, Doran Stabler, the attending physician, personally viewed and interpreted this ECG.   Date: 01/08/2015  EKG Time: 1601  Rate: 65  Rhythm: normal sinus rhythm  Axis: Left axis deviation  Intervals:none  ST&T Change: No elevations or depressions to the ST segments. No abnormal T-wave inversions.  ____________________________________________  RADIOLOGY  Comminuted right intertrochanteric fracture. No acute cardiopulmonary disease on the chest x-ray. I personally reviewed these images. ____________________________________________   PROCEDURES    ____________________________________________   INITIAL IMPRESSION / ASSESSMENT AND PLAN / ED COURSE  Pertinent labs & imaging results that were available during my care of the patient were reviewed by me and considered in my medical decision making (see chart for details).  ----------------------------------------- 4:07 PM on 01/08/2015 -----------------------------------------  Patient be admitted to the hospital. Signed out to Dr. Bridgett Larsson of the medicine service. Discussed with Dr. Sabra Heck of orthopedics who saw the patient at the bedside and similar with the patient and her family. ____________________________________________  FINAL CLINICAL IMPRESSION(S) / ED DIAGNOSES  Acute right intertrochanteric hip fracture. Initial visit.    Orbie Pyo, MD 01/08/15 [phone]

## 2015-01-08 NOTE — ED Notes (Signed)
Pt transported to xray 

## 2015-01-08 NOTE — Consult Note (Signed)
ORTHOPAEDIC CONSULTATION  REQUESTING PHYSICIAN: Demetrios Loll, MD  Chief Complaint: Right hip pain  HPI: Christina Hahn is a 81 y.o. female who complains of  right hip pain secondary to a fall at her nursing facility today.  The patient uses a walker but lost her balance and fell.  She was brought to the emergency room where exam and x-rays show a comminuted intertrochanteric fracture of the right hip with displacement.  Patient has been a patient of mine for many years.  I discussed surgical treatment with the patient who agrees to surgery tomorrow.  I will also discuss this with her family at length.  The risks and benefits were discussed.  Past Medical History  Diagnosis Date  . Arthritis   . Hypertension   . GERD (gastroesophageal reflux disease)   . Depression   . Eczema   . Asthma   . Anxiety   . Dementia   . Thyroid disease    Past Surgical History  Procedure Laterality Date  . Cholecystectomy    . Tonsillectomy    . Abdominal hysterectomy    . Breast lumpectomy    . Laminectomy    . Cataract extraction     History   Social History  . Marital Status: Widowed    Spouse Name: N/A  . Number of Children: N/A  . Years of Education: N/A   Social History Main Topics  . Smoking status: Never Smoker   . Smokeless tobacco: Not on file  . Alcohol Use: No  . Drug Use: Not on file  . Sexual Activity: Not on file   Other Topics Concern  . None   Social History Narrative   Family History  Problem Relation Age of Onset  . Pancreatic cancer Mother   . CAD Father   . Lung cancer Sister   . Lung cancer Brother    Allergies  Allergen Reactions  . Antivert [Meclizine] Other (See Comments)    Reaction:  Dizziness   . Codeine Other (See Comments)    Reaction:  Gives pt nightmares   . Dramamine [Dimenhydrinate] Other (See Comments)    Reaction:  Dizziness   . Flonase [Fluticasone Propionate] Other (See Comments)    Reaction:  Unknown   . Nsaids Other (See Comments)   Reaction:  Unknown   . Penicillins Other (See Comments)    Reaction:  Unknown   . Shrimp [Shellfish Allergy] Other (See Comments)    Reaction:  Unknown   . Tape Other (See Comments)    Reaction:  Tears pts skin   . Voltaren [Diclofenac Sodium] Other (See Comments)    Reaction:  Unknown    Prior to Admission medications   Medication Sig Start Date End Date Taking? Authorizing Provider  acetaminophen (TYLENOL) 325 MG tablet Take 650 mg by mouth every 4 (four) hours as needed for mild pain.    Yes Historical Provider, MD  albuterol (PROVENTIL HFA;VENTOLIN HFA) 108 (90 BASE) MCG/ACT inhaler Inhale 2 puffs into the lungs every 8 (eight) hours as needed for shortness of breath.    Yes Historical Provider, MD  albuterol (PROVENTIL) (2.5 MG/3ML) 0.083% nebulizer solution Take 2.5 mg by nebulization every 6 (six) hours as needed for shortness of breath.   Yes Historical Provider, MD  aspirin 81 MG chewable tablet Chew 81 mg by mouth daily.   Yes Historical Provider, MD  Calcium Carbonate-Vitamin D 600-400 MG-UNIT per tablet Take 1 tablet by mouth daily.   Yes Historical Provider, MD  ciprofloxacin (CIPRO) 500 MG tablet Take 500 mg by mouth 2 (two) times daily. 01/06/15 01/16/15 Yes Historical Provider, MD  citalopram (CELEXA) 20 MG tablet Take 20 mg by mouth 2 (two) times daily.   Yes Historical Provider, MD  docusate sodium (COLACE) 100 MG capsule Take 1 capsule (100 mg total) by mouth 2 (two) times daily. 11/27/14  Yes Srikar Sudini, MD  donepezil (ARICEPT) 5 MG tablet Take 5 mg by mouth daily.   Yes Historical Provider, MD  enalapril (VASOTEC) 5 MG tablet Take 5 mg by mouth daily.   Yes Historical Provider, MD  erythromycin ophthalmic ointment Place 1 application into the left eye 4 (four) times daily.    Yes Historical Provider, MD  Fluticasone-Salmeterol (ADVAIR) 250-50 MCG/DOSE AEPB Inhale 1 puff into the lungs 2 (two) times daily.   Yes Historical Provider, MD  lactose free nutrition (BOOST) LIQD  Take 237 mLs by mouth 2 (two) times daily as needed (when pt is not eating well).    Yes Historical Provider, MD  levothyroxine (SYNTHROID, LEVOTHROID) 75 MCG tablet Take 75 mcg by mouth daily.    Yes Historical Provider, MD  loratadine (CLARITIN) 10 MG tablet Take 10 mg by mouth daily.   Yes Historical Provider, MD  montelukast (SINGULAIR) 10 MG tablet Take 10 mg by mouth daily.   Yes Historical Provider, MD  Multiple Vitamin (THEREMS) TABS Take 1 tablet by mouth 2 (two) times daily.   Yes Historical Provider, MD  OLANZapine (ZYPREXA) 2.5 MG tablet Take 1 tablet (2.5 mg total) by mouth at bedtime. 11/27/14  Yes Srikar Sudini, MD  omeprazole (PRILOSEC) 20 MG capsule Take 20 mg by mouth 2 (two) times daily.    Yes Historical Provider, MD  Polyethyl Glycol-Propyl Glycol (SYSTANE OP) Apply 1 drop to eye 2 (two) times daily as needed (for dry eyes).   Yes Historical Provider, MD  traMADol (ULTRAM) 50 MG tablet Take 50 mg by mouth every 6 (six) hours as needed for moderate pain.   Yes Historical Provider, MD  trospium (SANCTURA) 20 MG tablet Take 20 mg by mouth daily.   Yes Historical Provider, MD  ciprofloxacin (CIPRO) 500 MG tablet Take 1 tablet (500 mg total) by mouth 2 (two) times daily. Patient not taking: Reported on 01/08/2015 11/27/14   Hillary Bow, MD  feeding supplement, ENSURE ENLIVE, (ENSURE ENLIVE) LIQD Take 237 mLs by mouth 3 (three) times daily between meals. Patient not taking: Reported on 01/08/2015 11/27/14   Hillary Bow, MD  sulfamethoxazole-trimethoprim (BACTRIM DS,SEPTRA DS) 800-160 MG per tablet Take 1 tablet by mouth 2 (two) times daily. Patient not taking: Reported on 01/08/2015 11/27/14   Hillary Bow, MD   Dg Chest 1 View  01/08/2015   CLINICAL DATA:  Preop for hip fracture.  EXAM: CHEST  1 VIEW  COMPARISON:  January 04, 2015.  FINDINGS: Stable cardiomediastinal silhouette. No pneumothorax or pleural effusion is noted. Stable hiatal hernia is noted. No acute pulmonary disease is  noted. Bony thorax is intact.  IMPRESSION: Stable hiatal hernia.  No acute cardiopulmonary abnormality seen.   Electronically Signed   By: Marijo Conception, M.D.   On: 01/08/2015 15:47   Dg Hip Unilat With Pelvis 2-3 Views Right  01/08/2015   CLINICAL DATA:  Pain following fall  EXAM: DG HIP (WITH OR WITHOUT PELVIS) 2-3V RIGHT  COMPARISON:  None.  FINDINGS: Frontal pelvis as well as frontal and lateral right hip images were obtained. There is a comminuted intertrochanteric femur fracture on  the right with varus angulation at the fracture site. No other fractures are identified. No dislocation. Bones are osteoporotic. There is mild narrowing of both hip joints.  IMPRESSION: Comminuted intertrochanteric femur fracture on the right with varus angulation of the fracture site. No dislocation. Narrowing both hip joints. Bones osteoporotic.   Electronically Signed   By: Lowella Grip III M.D.   On: 01/08/2015 15:46    Positive ROS: All other systems have been reviewed and were otherwise negative with the exception of those mentioned in the HPI and as above.  Physical Exam: General: Alert, no acute distress Cardiovascular: No pedal edema Respiratory: No cyanosis, no use of accessory musculature GI: No organomegaly, abdomen is soft and non-tender Skin: No lesions in the area of chief complaint Neurologic: Sensation intact distally Psychiatric: Patient is competent for consent with normal mood and affect Lymphatic: No axillary or cervical lymphadenopathy  MUSCULOSKELETAL: The patient is alert, cooperative.  She is oriented and recognizes me.  The right leg is shortened and rotated.  There is significant pain with movement of the hip.  Neurovascular status is good distally, and the skin is intact.  No other orthopedic injuries are noted.  Assessment: Comminuted displaced intertrochanteric fracture right hip  Plan: Open reduction internal fixation right hip tomorrow morning when cleared by the medical  service    Park Breed, MD [phone]   01/08/2015 4:25 PM

## 2015-01-08 NOTE — H&P (Addendum)
[facility] [place] NAME: Christina Hahn    MR#:  [id number]  DATE OF BIRTH:  1922/07/22  DATE OF ADMISSION:  01/08/2015  PRIMARY CARE PHYSICIAN: [name], MD   REQUESTING/REFERRING PHYSICIAN: Orbie Pyo, MD  CHIEF COMPLAINT:   Chief Complaint  Patient presents with  . Fall   mechanical fall today.  HISTORY OF PRESENT ILLNESS:  Christina Hahn  is a 81 y.o. female with a known history of hypertension, CKD, CVA and dementia. The patient is demented, only know her name, unable to provide any information. According to ED physician, the patient was sent from the facility to ED due to mechanical fall. The patient was witnessed to have lost her balance and fallen on her right side. She complains of right pain. HipX-ray show right hip fracture. Dr. Sabra Heck was informed and will do hip surgery tomorrow. The patient was admitted for sepsis UTI and bacteremia last month. The patient does not note any pain to her head. No reports of loss of consciousness.  PAST MEDICAL HISTORY:   Past Medical History  Diagnosis Date  . Arthritis   . Hypertension   . GERD (gastroesophageal reflux disease)   . Depression   . Eczema   . Asthma   . Anxiety   . Dementia   . Thyroid disease   . Stroke   . Chronic kidney disease     PAST SURGICAL HISTORY:   Past Surgical History  Procedure Laterality Date  . Cholecystectomy    . Tonsillectomy    . Abdominal hysterectomy    . Breast lumpectomy    . Laminectomy    . Cataract extraction      SOCIAL HISTORY:   History  Substance Use Topics  . Smoking status: Never Smoker   . Smokeless tobacco: Not on file  . Alcohol Use: No    FAMILY HISTORY:   Family History  Problem Relation Age of Onset  . Pancreatic cancer Mother   . CAD Father   . Lung cancer Sister   . Lung cancer Brother     DRUG ALLERGIES:   Allergies  Allergen Reactions  . Antivert [Meclizine] Other (See Comments)     Reaction:  Dizziness   . Codeine Other (See Comments)    Reaction:  Gives pt nightmares   . Dramamine [Dimenhydrinate] Other (See Comments)    Reaction:  Dizziness   . Flonase [Fluticasone Propionate] Other (See Comments)    Reaction:  Unknown   . Nsaids Other (See Comments)    Reaction:  Unknown   . Penicillins Other (See Comments)    Reaction:  Unknown   . Shrimp [Shellfish Allergy] Other (See Comments)    Reaction:  Unknown   . Tape Other (See Comments)    Reaction:  Tears pts skin   . Voltaren [Diclofenac Sodium] Other (See Comments)    Reaction:  Unknown     REVIEW OF SYSTEMS:  Demented, unable to get review of system.   MEDICATIONS AT HOME:   Prior to Admission medications   Medication Sig Start Date End Date Taking? Authorizing Provider  acetaminophen (TYLENOL) 325 MG tablet Take 650 mg by mouth every 4 (four) hours as needed for mild pain.    Yes Historical Provider, MD  albuterol (PROVENTIL HFA;VENTOLIN HFA) 108 (90 BASE) MCG/ACT inhaler Inhale 2 puffs into the lungs every 8 (eight) hours as needed for shortness of breath.    Yes Historical Provider, MD  albuterol (PROVENTIL) (2.5 MG/3ML) 0.083% nebulizer solution Take 2.5 mg by nebulization every 6 (six) hours as needed for shortness of breath.   Yes Historical Provider, MD  aspirin 81 MG chewable tablet Chew 81 mg by mouth daily.   Yes Historical Provider, MD  Calcium Carbonate-Vitamin D 600-400 MG-UNIT per tablet Take 1 tablet by mouth daily.   Yes Historical Provider, MD  ciprofloxacin (CIPRO) 500 MG tablet Take 500 mg by mouth 2 (two) times daily. 01/06/15 01/16/15 Yes Historical Provider, MD  citalopram (CELEXA) 20 MG tablet Take 20 mg by mouth 2 (two) times daily.   Yes Historical Provider, MD  docusate sodium (COLACE) 100 MG capsule Take 1 capsule (100 mg total) by mouth 2 (two) times daily. 11/27/14  Yes Srikar Sudini, MD  donepezil (ARICEPT) 5 MG tablet Take 5 mg by mouth daily.   Yes Historical Provider, MD   enalapril (VASOTEC) 5 MG tablet Take 5 mg by mouth daily.   Yes Historical Provider, MD  erythromycin ophthalmic ointment Place 1 application into the left eye 4 (four) times daily.    Yes Historical Provider, MD  Fluticasone-Salmeterol (ADVAIR) 250-50 MCG/DOSE AEPB Inhale 1 puff into the lungs 2 (two) times daily.   Yes Historical Provider, MD  lactose free nutrition (BOOST) LIQD Take 237 mLs by mouth 2 (two) times daily as needed (when pt is not eating well).    Yes Historical Provider, MD  levothyroxine (SYNTHROID, LEVOTHROID) 75 MCG tablet Take 75 mcg by mouth daily.    Yes Historical Provider, MD  loratadine (CLARITIN) 10 MG tablet Take 10 mg by mouth daily.   Yes Historical Provider, MD  montelukast (SINGULAIR) 10 MG tablet Take 10 mg by mouth daily.   Yes Historical Provider, MD  Multiple Vitamin (THEREMS) TABS Take 1 tablet by mouth 2 (two) times daily.   Yes Historical Provider, MD  OLANZapine (ZYPREXA) 2.5 MG tablet Take 1 tablet (2.5 mg total) by mouth at bedtime. 11/27/14  Yes Srikar Sudini, MD  omeprazole (PRILOSEC) 20 MG capsule Take 20 mg by mouth 2 (two) times daily.    Yes Historical Provider, MD  Polyethyl Glycol-Propyl Glycol (SYSTANE OP) Apply 1 drop to eye 2 (two) times daily as needed (for dry eyes).   Yes Historical Provider, MD  traMADol (ULTRAM) 50 MG tablet Take 50 mg by mouth every 6 (six) hours as needed for moderate pain.   Yes Historical Provider, MD  trospium (SANCTURA) 20 MG tablet Take 20 mg by mouth daily.   Yes Historical Provider, MD  ciprofloxacin (CIPRO) 500 MG tablet Take 1 tablet (500 mg total) by mouth 2 (two) times daily. Patient not taking: Reported on 01/08/2015 11/27/14   Hillary Bow, MD  feeding supplement, ENSURE ENLIVE, (ENSURE ENLIVE) LIQD Take 237 mLs by mouth 3 (three) times daily between meals. Patient not taking: Reported on 01/08/2015 11/27/14   Hillary Bow, MD  sulfamethoxazole-trimethoprim (BACTRIM DS,SEPTRA DS) 800-160 MG per tablet Take 1  tablet by mouth 2 (two) times daily. Patient not taking: Reported on 01/08/2015 11/27/14   Hillary Bow, MD      VITAL SIGNS:  Blood pressure 129/45, pulse 59, temperature 98.6 F (37 C), temperature source Oral, resp. rate 20, height 5\' 5"  (1.651 m), weight 71.668 kg (158 lb), SpO2 98 %.  PHYSICAL EXAMINATION:  GENERAL:  81 y.o.-year-old patient lying in the bed with no acute distress.  EYES: Pupils equal, round, reactive to light and accommodation. No scleral icterus. Extraocular muscles intact.  HEENT: Head atraumatic,  normocephalic. Oropharynx and nasopharynx clear.  NECK:  Supple, no jugular venous distention. No thyroid enlargement, no tenderness.  LUNGS: Normal breath sounds bilaterally, no wheezing, rales,rhonchi or crepitation. No use of accessory muscles of respiration.  CARDIOVASCULAR: S1, S2 normal. No murmurs, rubs, or gallops.  ABDOMEN: Soft, nontender, nondistended. Bowel sounds present. No organomegaly or mass.  EXTREMITIES: No pedal edema, cyanosis, or clubbing. Shortening of the right leg without rotation. NEUROLOGIC: Demented but follow commands. Cranial nerves II through XII are intact. Unable to move lower extremities.  PSYCHIATRIC: The patient is demented.  SKIN: No obvious rash, lesion, or ulcer.   LABORATORY PANEL:   CBC  Recent Labs Lab 01/08/15 1517  WBC 5.9  HGB 10.1*  HCT 30.3*  PLT 149*   ------------------------------------------------------------------------------------------------------------------  Chemistries   Recent Labs Lab 01/08/15 1517  NA 138  K 4.1  CL 104  CO2 27  GLUCOSE 109*  BUN 15  CREATININE 1.87*  CALCIUM 9.1   ------------------------------------------------------------------------------------------------------------------  Cardiac Enzymes  Recent Labs Lab 01/08/15 1517  TROPONINI <0.03    ------------------------------------------------------------------------------------------------------------------  RADIOLOGY:  Dg Chest 1 View  01/08/2015   CLINICAL DATA:  Preop for hip fracture.  EXAM: CHEST  1 VIEW  COMPARISON:  January 04, 2015.  FINDINGS: Stable cardiomediastinal silhouette. No pneumothorax or pleural effusion is noted. Stable hiatal hernia is noted. No acute pulmonary disease is noted. Bony thorax is intact.  IMPRESSION: Stable hiatal hernia.  No acute cardiopulmonary abnormality seen.   Electronically Signed   By: Marijo Conception, M.D.   On: 01/08/2015 15:47   Dg Hip Unilat With Pelvis 2-3 Views Right  01/08/2015   CLINICAL DATA:  Pain following fall  EXAM: DG HIP (WITH OR WITHOUT PELVIS) 2-3V RIGHT  COMPARISON:  None.  FINDINGS: Frontal pelvis as well as frontal and lateral right hip images were obtained. There is a comminuted intertrochanteric femur fracture on the right with varus angulation at the fracture site. No other fractures are identified. No dislocation. Bones are osteoporotic. There is mild narrowing of both hip joints.  IMPRESSION: Comminuted intertrochanteric femur fracture on the right with varus angulation of the fracture site. No dislocation. Narrowing both hip joints. Bones osteoporotic.   Electronically Signed   By: Lowella Grip III M.D.   On: 01/08/2015 15:46    EKG:   Orders placed or performed during the hospital encounter of 01/08/15  . ED EKG  . ED EKG    IMPRESSION AND PLAN:    Right hip fracture, closed Anemia of chronic disease Acute renal failure on CKD stage IV Hypertension Dementia  The patient will be admitted to medical floor. She has multiple medical problems, has a moderate to high risk for hip surgery. I will hold aspirin and DVT prophylaxis, Dr. Sabra Heck will do surgery tomorrow. Follow-up hemoglobin after surgery. I will give gentle IV fluid support and follow-up BMP. Follow-up urinalysis. Continue hypertension  medication.  I discussed with the Dr. Sabra Heck. I called patient daughter Ms. Jeneen Rinks but no one answered the phone.  All the records are reviewed and case discussed with ED provider.  CODE STATUS: DNR  TOTAL TIME TAKING CARE OF THIS PATIENT: 52 minutes.    Demetrios Loll M.D on 01/08/2015 at 4:43 PM  Between 7am to 6pm - Pager - [phone]  After 6pm go to [email or web address] - password [email or web address] [facility]  Office  [phone]  CC: Primary care physician; [name], MD

## 2015-01-08 NOTE — ED Notes (Signed)
MD at bedside. 

## 2015-01-09 ENCOUNTER — Encounter
Admission: EM | Disposition: A | Discharge status: Skilled Nursing Facility | Payer: Self-pay | Source: Home / Self Care | Attending: Internal Medicine

## 2015-01-09 ENCOUNTER — Inpatient Hospital Stay: Payer: Medicare Other | Admitting: Anesthesiology

## 2015-01-09 ENCOUNTER — Inpatient Hospital Stay: Payer: Medicare Other

## 2015-01-09 ENCOUNTER — Encounter: Payer: Self-pay | Admitting: Anesthesiology

## 2015-01-09 HISTORY — PX: INTRAMEDULLARY (IM) NAIL INTERTROCHANTERIC: SHX5875

## 2015-01-09 LAB — CBC
HEMATOCRIT: 25.2 % — AB (ref 35.0–47.0)
Hemoglobin: 8.3 g/dL — ABNORMAL LOW (ref 12.0–16.0)
MCH: 31.2 pg (ref 26.0–34.0)
MCHC: 32.8 g/dL (ref 32.0–36.0)
MCV: 95 fL (ref 80.0–100.0)
Platelets: 129 10*3/uL — ABNORMAL LOW (ref 150–440)
RBC: 2.65 MIL/uL — ABNORMAL LOW (ref 3.80–5.20)
RDW: 13.9 % (ref 11.5–14.5)
WBC: 6.6 10*3/uL (ref 3.6–11.0)

## 2015-01-09 LAB — VITAMIN D 25 HYDROXY (VIT D DEFICIENCY, FRACTURES): Vit D, 25-Hydroxy: 23.7 ng/mL — ABNORMAL LOW (ref 30.0–100.0)

## 2015-01-09 LAB — HEMOGLOBIN AND HEMATOCRIT, BLOOD
HEMATOCRIT: 28.7 % — AB (ref 35.0–47.0)
HEMOGLOBIN: 9.8 g/dL — AB (ref 12.0–16.0)

## 2015-01-09 LAB — PREPARE RBC (CROSSMATCH)

## 2015-01-09 LAB — BASIC METABOLIC PANEL
Anion gap: 7 (ref 5–15)
BUN: 16 mg/dL (ref 6–20)
CO2: 24 mmol/L (ref 22–32)
Calcium: 8.4 mg/dL — ABNORMAL LOW (ref 8.9–10.3)
Chloride: 107 mmol/L (ref 101–111)
Creatinine, Ser: 1.63 mg/dL — ABNORMAL HIGH (ref 0.44–1.00)
GFR, EST AFRICAN AMERICAN: 31 mL/min — AB (ref >60)
GFR, EST NON AFRICAN AMERICAN: 27 mL/min — AB (ref >60)
Glucose, Bld: 134 mg/dL — ABNORMAL HIGH (ref 65–99)
Potassium: 4.5 mmol/L (ref 3.5–5.1)
Sodium: 138 mmol/L (ref 135–145)

## 2015-01-09 SURGERY — FIXATION, FRACTURE, INTERTROCHANTERIC, WITH INTRAMEDULLARY ROD
Anesthesia: Spinal | Laterality: Right

## 2015-01-09 MED ORDER — FENTANYL CITRATE (PF) 100 MCG/2ML IJ SOLN
INTRAMUSCULAR | Status: AC
  Filled 2015-01-09: qty 2

## 2015-01-09 MED ORDER — MORPHINE SULFATE 2 MG/ML IJ SOLN: 0.5000 mg | INTRAMUSCULAR | Status: DC | PRN

## 2015-01-09 MED ORDER — MIDAZOLAM HCL 5 MG/5ML IJ SOLN
INTRAMUSCULAR | Status: DC | PRN
  Administered 2015-01-09 (×2): 1 mg via INTRAVENOUS

## 2015-01-09 MED ORDER — CLINDAMYCIN PHOSPHATE 600 MG/50ML IV SOLN
600.0000 mg | Freq: Three times a day (TID) | INTRAVENOUS | Status: AC
  Administered 2015-01-09 – 2015-01-10 (×3): 600 mg via INTRAVENOUS
  Filled 2015-01-09 (×3): qty 50

## 2015-01-09 MED ORDER — METOCLOPRAMIDE HCL 10 MG PO TABS: 5.0000 mg | ORAL_TABLET | Freq: Three times a day (TID) | ORAL | Status: DC | PRN

## 2015-01-09 MED ORDER — MENTHOL 3 MG MT LOZG: 1.0000 | LOZENGE | OROMUCOSAL | Status: DC | PRN

## 2015-01-09 MED ORDER — PHENOL 1.4 % MT LIQD: 1.0000 | OROMUCOSAL | Status: DC | PRN

## 2015-01-09 MED ORDER — ALUM & MAG HYDROXIDE-SIMETH 200-200-20 MG/5ML PO SUSP: 30.0000 mL | ORAL | Status: DC | PRN

## 2015-01-09 MED ORDER — CLINDAMYCIN PHOSPHATE 900 MG/50ML IV SOLN
INTRAVENOUS | Status: DC | PRN
  Administered 2015-01-09: 900 mg via INTRAVENOUS

## 2015-01-09 MED ORDER — METOCLOPRAMIDE HCL 5 MG/ML IJ SOLN: 5.0000 mg | Freq: Three times a day (TID) | INTRAMUSCULAR | Status: DC | PRN

## 2015-01-09 MED ORDER — EPHEDRINE SULFATE 50 MG/ML IJ SOLN
INTRAMUSCULAR | Status: DC | PRN
  Administered 2015-01-09 (×2): 20 mg via INTRAVENOUS
  Administered 2015-01-09: 10 mg via INTRAVENOUS

## 2015-01-09 MED ORDER — ENSURE ENLIVE PO LIQD
237.0000 mL | Freq: Two times a day (BID) | ORAL | Status: DC
  Administered 2015-01-10 – 2015-01-12 (×4): 237 mL via ORAL

## 2015-01-09 MED ORDER — ACETAMINOPHEN 500 MG PO TABS
1000.0000 mg | ORAL_TABLET | Freq: Four times a day (QID) | ORAL | Status: AC
  Administered 2015-01-09 – 2015-01-10 (×2): 1000 mg via ORAL
  Filled 2015-01-09 (×2): qty 2

## 2015-01-09 MED ORDER — SODIUM CHLORIDE 0.9 % IV SOLN
Freq: Once | INTRAVENOUS | Status: DC
  Administered 2015-01-09: 10:00:00 via INTRAVENOUS

## 2015-01-09 MED ORDER — FERROUS SULFATE 325 (65 FE) MG PO TABS
325.0000 mg | ORAL_TABLET | Freq: Every day | ORAL | Status: DC
  Administered 2015-01-10 – 2015-01-12 (×3): 325 mg via ORAL
  Filled 2015-01-09 (×3): qty 1

## 2015-01-09 MED ORDER — PHENYLEPHRINE HCL 10 MG/ML IJ SOLN
INTRAMUSCULAR | Status: DC | PRN
  Administered 2015-01-09 (×4): 50 ug via INTRAVENOUS

## 2015-01-09 MED ORDER — CEFAZOLIN SODIUM-DEXTROSE 2-3 GM-% IV SOLR
2.0000 g | Freq: Four times a day (QID) | INTRAVENOUS | Status: AC
Start: 2015-01-09 — End: 2015-01-09
  Administered 2015-01-09 (×2): 2 g via INTRAVENOUS
  Filled 2015-01-09 (×2): qty 50

## 2015-01-09 MED ORDER — LACTATED RINGERS IV SOLN
INTRAVENOUS | Status: DC | PRN
  Administered 2015-01-09: 08:00:00 via INTRAVENOUS

## 2015-01-09 MED ORDER — SODIUM CHLORIDE 0.9 % IV SOLN: Freq: Once | INTRAVENOUS | Status: DC

## 2015-01-09 MED ORDER — NEOMYCIN-POLYMYXIN B GU 40-200000 IR SOLN
Status: DC | PRN
  Administered 2015-01-09: 4 mL

## 2015-01-09 MED ORDER — SODIUM CHLORIDE 0.45 % IV SOLN
INTRAVENOUS | Status: DC
  Administered 2015-01-09 – 2015-01-10 (×2): via INTRAVENOUS

## 2015-01-09 MED ORDER — ACETAMINOPHEN 325 MG PO TABS
650.0000 mg | ORAL_TABLET | Freq: Four times a day (QID) | ORAL | Status: DC | PRN
  Administered 2015-01-09: 650 mg via ORAL

## 2015-01-09 MED ORDER — BUPIVACAINE-EPINEPHRINE (PF) 0.25% -1:200000 IJ SOLN
INTRAMUSCULAR | Status: AC
  Filled 2015-01-09: qty 30

## 2015-01-09 MED ORDER — NEOMYCIN-POLYMYXIN B GU 40-200000 IR SOLN
Status: AC
  Filled 2015-01-09: qty 4

## 2015-01-09 MED ORDER — ENOXAPARIN SODIUM 30 MG/0.3ML ~~LOC~~ SOLN
30.0000 mg | SUBCUTANEOUS | Status: DC
  Administered 2015-01-10 – 2015-01-12 (×3): 30 mg via SUBCUTANEOUS
  Filled 2015-01-09 (×3): qty 0.3

## 2015-01-09 MED ORDER — FENTANYL CITRATE (PF) 100 MCG/2ML IJ SOLN
25.0000 ug | INTRAMUSCULAR | Status: DC | PRN
  Administered 2015-01-09: 25 ug via INTRAVENOUS

## 2015-01-09 MED ORDER — MAGNESIUM HYDROXIDE 400 MG/5ML PO SUSP
30.0000 mL | Freq: Every day | ORAL | Status: DC | PRN
  Administered 2015-01-11: 30 mL via ORAL
  Filled 2015-01-09: qty 30

## 2015-01-09 MED ORDER — BUPIVACAINE-EPINEPHRINE (PF) 0.25% -1:200000 IJ SOLN
INTRAMUSCULAR | Status: DC | PRN
  Administered 2015-01-09: 30 mL via PERINEURAL

## 2015-01-09 MED ORDER — CEFAZOLIN SODIUM-DEXTROSE 2-3 GM-% IV SOLR
INTRAVENOUS | Status: DC | PRN
  Administered 2015-01-09: 2 g via INTRAVENOUS

## 2015-01-09 MED ORDER — ONDANSETRON HCL 4 MG/2ML IJ SOLN: 4.0000 mg | Freq: Once | INTRAMUSCULAR | Status: DC | PRN

## 2015-01-09 MED ORDER — ACETAMINOPHEN 650 MG RE SUPP: 650.0000 mg | Freq: Four times a day (QID) | RECTAL | Status: DC | PRN

## 2015-01-09 MED ORDER — FENTANYL CITRATE (PF) 100 MCG/2ML IJ SOLN
INTRAMUSCULAR | Status: DC | PRN
  Administered 2015-01-09: 50 ug via INTRAVENOUS

## 2015-01-09 MED ORDER — HYDROCODONE-ACETAMINOPHEN 5-325 MG PO TABS
1.0000 | ORAL_TABLET | Freq: Four times a day (QID) | ORAL | Status: DC | PRN
  Administered 2015-01-09 – 2015-01-12 (×10): 1 via ORAL
  Filled 2015-01-09 (×9): qty 1

## 2015-01-09 MED ORDER — BISACODYL 10 MG RE SUPP: 10.0000 mg | Freq: Every day | RECTAL | Status: DC | PRN

## 2015-01-09 MED ORDER — FLEET ENEMA 7-19 GM/118ML RE ENEM: 1.0000 | ENEMA | Freq: Once | RECTAL | Status: AC | PRN

## 2015-01-09 MED ORDER — PROPOFOL INFUSION 10 MG/ML OPTIME
INTRAVENOUS | Status: DC | PRN
  Administered 2015-01-09: 25 ug/kg/min via INTRAVENOUS

## 2015-01-09 MED ORDER — PROPOFOL 10 MG/ML IV BOLUS
INTRAVENOUS | Status: DC | PRN
  Administered 2015-01-09: 10 mg via INTRAVENOUS

## 2015-01-09 SURGICAL SUPPLY — 35 items
11mm/125 tfn nail ×2 IMPLANT
38mm locking screw TFN ×2 IMPLANT
95mm titanium helical blade ×3 IMPLANT
BIT DRILL GRAY 5X250 (MISCELLANEOUS) ×3 IMPLANT
BIT DRILL RADIO 4.0 (BIT) ×3 IMPLANT
BLADE TI HELICAL 11.0 95 (Orthopedic Implant) ×2 IMPLANT
BLADE TI HELICAL 11.0MM 95MM (Orthopedic Implant) ×1 IMPLANT
CANISTER SUCT 1200ML W/VALVE (MISCELLANEOUS) ×3 IMPLANT
CHLORAPREP W/TINT 26ML (MISCELLANEOUS) ×6 IMPLANT
DRAPE INCISE 23X17 IOBAN STRL (DRAPES) ×2
DRAPE INCISE IOBAN 23X17 STRL (DRAPES) ×1 IMPLANT
DRSG AQUACEL AG ADV 3.5X10 (GAUZE/BANDAGES/DRESSINGS) ×3 IMPLANT
GAUZE PETRO XEROFOAM 1X8 (MISCELLANEOUS) ×3 IMPLANT
GAUZE SPONGE 4X4 12PLY STRL (GAUZE/BANDAGES/DRESSINGS) ×3 IMPLANT
GAUZE XEROFORM 4X4 STRL (GAUZE/BANDAGES/DRESSINGS) ×3 IMPLANT
GLOVE INDICATOR 8.0 STRL GRN (GLOVE) ×3 IMPLANT
GLOVE SURG ORTHO 8.5 STRL (GLOVE) ×3 IMPLANT
GOWN STRL REUS W/ TWL LRG LVL3 (GOWN DISPOSABLE) ×2 IMPLANT
GOWN STRL REUS W/TWL LRG LVL3 (GOWN DISPOSABLE) ×4
KIT RM TURNOVER STRD PROC AR (KITS) ×3 IMPLANT
MAT BLUE FLOOR 46X72 FLO (MISCELLANEOUS) ×3 IMPLANT
NAIL TFN 11/125 170 (Nail) ×3 IMPLANT
NEEDLE SPNL 18GX3.5 QUINCKE PK (NEEDLE) ×3 IMPLANT
NS IRRIG 500ML POUR BTL (IV SOLUTION) ×3 IMPLANT
PACK HIP COMPR (MISCELLANEOUS) ×3 IMPLANT
PAD GROUND ADULT SPLIT (MISCELLANEOUS) ×3 IMPLANT
REAMER ROD DEEP FLUTE 2.5X950 (INSTRUMENTS) ×3 IMPLANT
SCREW LOCK TI 5.0X38 F/IM NAIL (Screw) ×3 IMPLANT
STAPLER SKIN PROX 35W (STAPLE) ×3 IMPLANT
SUCTION FRAZIER TIP 10 FR DISP (SUCTIONS) ×3 IMPLANT
SUT VIC AB 0 CT1 36 (SUTURE) ×3 IMPLANT
SUT VIC AB 2-0 CT1 27 (SUTURE) ×2
SUT VIC AB 2-0 CT1 TAPERPNT 27 (SUTURE) ×1 IMPLANT
SYR 30ML LL (SYRINGE) ×3 IMPLANT
TFN 4.00 DRILL BIT IMPLANT

## 2015-01-09 NOTE — Op Note (Signed)
DATE OF SURGERY:  01/09/2015  TIME: 9:48 AM  PATIENT NAME:  Christina Hahn  AGE: 81 y.o.  PRE-OPERATIVE DIAGNOSIS:  Comminuted displaced intertrochanteric fracture right hip  POST-OPERATIVE DIAGNOSIS:  SAME  PROCEDURE:  INTRAMEDULLARY (IM) NAIL INTERTROCHANTRIC with trochanteric fixation nail  SURGEON:  Jhalen Eley E     OPERATIVE IMPLANTS: Synthes trochanteric femoral nail 125/11 mm with interlocking helical blade 95 mm  and distal locking screw 38 mm  PREOPERATIVE INDICATIONS:  Christina Hahn is a 81 y.o. year old who fell and suffered a hip fracture. She was brought into the ER and then admitted and optimized and then elected for surgical intervention.    The risks benefits and alternatives were discussed with the patient including but not limited to the risks of nonoperative treatment, versus surgical intervention including infection, bleeding, nerve injury, malunion, nonunion, hardware prominence, hardware failure, need for hardware removal, blood clots, cardiopulmonary complications, morbidity, mortality, among others, and they were willing to proceed.    OPERATIVE PROCEDURE:  The patient was brought to the operating room and placed in the supine position. General anesthesia was administered, with a foley. She was placed on the fracture table.  Closed reduction was performed under C-arm guidance. The length of the femur was also measured using fluoroscopy. Time out was then performed after sterile prep and drape. She received preoperative antibiotics.  Incision was made proximal to the greater trochanter. A guidewire was placed in the appropriate position. Confirmation was made on AP and lateral views. The above-named nail was opened. I opened the proximal femur with a reamer. I then placed the nail by hand easily down. I did not need to ream the femur.  Once the nail was completely seated, I placed a guidepin into the femoral head into the center center position through a second  incision.  I measured the length, and then reamed the lateral cortex and up into the head. I then placed the helical blade. Slight compression was applied. Anatomic fixation achieved. Bone quality was mediocre.  I then secured the proximal interlock.  The distal locking screw was then placed and after confirming the position of the fracture fragments and hardware I then removed the instruments, and took final C-arm pictures AP and lateral the entire length of the leg. Anatomic reconstruction was achieved, and the wounds were irrigated copiously and closed with Vicryl  followed by staples and sterile gauze for the skin. The patient was awakened and returned to PACU in stable and satisfactory condition. There no complications and the patient tolerated the procedure well.  She will be partial weightbearing as tolerated, and will be on Lovenox  For DVT prophylaxis.     Park Breed, M.D.

## 2015-01-09 NOTE — OR Nursing (Signed)
1120 patient c/o pain vss will give meds for pain

## 2015-01-09 NOTE — Anesthesia Procedure Notes (Addendum)
Date/Time: 01/09/2015 8:05 AM Performed by: [email or web address], DAVID Pre-anesthesia Checklist: Patient identified, Emergency Drugs available, Suction available, Patient being monitored and Timeout performed Patient Re-evaluated:Patient Re-evaluated prior to inductionOxygen Delivery Method: Nasal cannula   Spinal Patient location during procedure: OR Staffing Anesthesiologist: Gunnar Bulla Performed by: anesthesiologist  Preanesthetic Checklist Completed: patient identified, site marked, surgical consent, pre-op evaluation, timeout performed, IV checked and risks and benefits discussed Spinal Block Patient position: sitting Prep: Betadine Patient monitoring: heart rate, cardiac monitor, continuous pulse ox and blood pressure Approach: midline Location: L3-4 Injection technique: single-shot Needle Needle type: Pencil-Tip  Needle gauge: 25 G Needle length: 9 cm Assessment Sensory level: T10 Additional Notes Marcaine 12 mg intrathecally.

## 2015-01-09 NOTE — Anesthesia Preprocedure Evaluation (Addendum)
Anesthesia Evaluation  Patient identified by MRN, date of birth, ID band Patient awake    Reviewed: Allergy & Precautions, NPO status , Patient's Chart, lab work & pertinent test results, reviewed documented beta blocker date and time   Airway Mallampati: II  TM Distance: >3 FB     Dental  (+) Chipped   Pulmonary asthma ,          Cardiovascular hypertension, Pt. on medications     Neuro/Psych PSYCHIATRIC DISORDERS Anxiety TIACVA    GI/Hepatic PUD, GERD-  Controlled,  Endo/Other  Hypothyroidism   Renal/GU ARFRenal disease     Musculoskeletal  (+) Arthritis -,   Abdominal   Peds  Hematology   Anesthesia Other Findings She has residual from the CVA. Generalized weakness. Lumbar and cervical lamianectomy.  Reproductive/Obstetrics                           Anesthesia Physical Anesthesia Plan  ASA: III  Anesthesia Plan: Spinal   Post-op Pain Management:    Induction:   Airway Management Planned: Nasal Cannula  Additional Equipment:   Intra-op Plan:   Post-operative Plan:   Informed Consent: I have reviewed the patients History and Physical, chart, labs and discussed the procedure including the risks, benefits and alternatives for the proposed anesthesia with the patient or authorized representative who has indicated his/her understanding and acceptance.     Plan Discussed with: CRNA  Anesthesia Plan Comments:         Anesthesia Quick Evaluation

## 2015-01-09 NOTE — Transfer of Care (Signed)
Immediate Anesthesia Transfer of Care Note  Patient: Christina Hahn  Procedure(s) Performed: Procedure(s): INTRAMEDULLARY (IM) NAIL INTERTROCHANTRIC (Right)  Patient Location: PACU  Anesthesia Type:Spinal  Level of Consciousness: awake and alert   Airway & Oxygen Therapy: Patient connected to nasal cannula oxygen  Post-op Assessment: Report given to RN  Post vital signs: stable  Last Vitals:  Filed Vitals:   01/09/15 0951  BP: 152/100  Pulse: 100  Temp: 37 C  Resp: 18    Complications: No apparent anesthesia complications

## 2015-01-09 NOTE — Progress Notes (Signed)
[place] [facility] [place] NAME: Christina Hahn    MR#:  [id number]  DATE OF BIRTH:  [birth year]  SUBJECTIVE: Admitted for right hip fracture status post surgery   CHIEF COMPLAINT:   Chief Complaint  Patient presents with  . Fall    REVIEW OF SYSTEMS:    Review of Systems  Constitutional: Negative for fever and chills.  HENT: Negative for hearing loss.   Eyes: Negative for blurred vision, double vision and photophobia.  Respiratory: Negative for cough, hemoptysis and shortness of breath.   Cardiovascular: Negative for palpitations, orthopnea and leg swelling.  Gastrointestinal: Negative for vomiting, abdominal pain and diarrhea.  Genitourinary: Negative for dysuria and urgency.  Musculoskeletal: Positive for joint pain and falls. Negative for myalgias and neck pain.  Skin: Negative for rash.  Neurological: Negative for dizziness, focal weakness, seizures, weakness and headaches.  Psychiatric/Behavioral: Positive for memory loss. The patient does not have insomnia.     Nutrition:  Tolerating Diet: Tolerating PT:      DRUG ALLERGIES:   Allergies  Allergen Reactions  . Antivert [Meclizine] Other (See Comments)    Reaction:  Dizziness   . Codeine Other (See Comments)    Reaction:  Gives pt nightmares   . Dramamine [Dimenhydrinate] Other (See Comments)    Reaction:  Dizziness   . Flonase [Fluticasone Propionate] Other (See Comments)    Reaction:  Unknown   . Nsaids Other (See Comments)    Reaction:  Unknown   . Penicillins Other (See Comments)    Reaction:  Unknown   . Shrimp [Shellfish Allergy] Other (See Comments)    Reaction:  Unknown   . Tape Other (See Comments)    Reaction:  Tears pts skin   . Voltaren [Diclofenac Sodium] Other (See Comments)    Reaction:  Unknown     VITALS:  Blood pressure 94/54, pulse 100, temperature 98.6 F (37 C), temperature source Oral, resp. rate 20, height 5\' 5"  (1.651 m), weight 71.668 kg  (158 lb), SpO2 100 %.  PHYSICAL EXAMINATION:   Physical Exam  GENERAL:  81 y.o.-year-old patient lying in the bed with no acute distress.  EYES: Pupils equal, round, reactive to light and accommodation. No scleral icterus. Extraocular muscles intact.  HEENT: Head atraumatic, normocephalic. Oropharynx and nasopharynx clear.  NECK:  Supple, no jugular venous distention. No thyroid enlargement, no tenderness.  LUNGS: Normal breath sounds bilaterally, no wheezing, rales,rhonchi or crepitation. No use of accessory muscles of respiration.  CARDIOVASCULAR: S1, S2 normal. No murmurs, rubs, or gallops.  ABDOMEN: Soft, nontender, nondistended. Bowel sounds present. No organomegaly or mass.  EXTREMITIES:  NEUROLOGIC: Cranial nerves II through XII are intact. Muscle strength 5/5 in all extremities. Sensation intact. Gait not checked.  PSYCHIATRIC:   SKIN: No obvious rash, lesion, or ulcer.    LABORATORY PANEL:   CBC  Recent Labs Lab 01/09/15 0507  WBC 6.6  HGB 8.3*  HCT 25.2*  PLT 129*   ------------------------------------------------------------------------------------------------------------------  Chemistries   Recent Labs Lab 01/09/15 0507  NA 138  K 4.5  CL 107  CO2 24  GLUCOSE 134*  BUN 16  CREATININE 1.63*  CALCIUM 8.4*   ------------------------------------------------------------------------------------------------------------------  Cardiac Enzymes  Recent Labs Lab 01/08/15 1517  TROPONINI <0.03   ------------------------------------------------------------------------------------------------------------------  RADIOLOGY:  Dg Chest 1 View  01/08/2015   CLINICAL DATA:  Preop for hip fracture.  EXAM: CHEST  1 VIEW  COMPARISON:  January 04, 2015.  FINDINGS: Stable cardiomediastinal silhouette.  No pneumothorax or pleural effusion is noted. Stable hiatal hernia is noted. No acute pulmonary disease is noted. Bony thorax is intact.  IMPRESSION: Stable hiatal hernia.   No acute cardiopulmonary abnormality seen.   Electronically Signed   By: Marijo Conception, M.D.   On: 01/08/2015 15:47   Dg Hip Operative Unilat With Pelvis Right  01/09/2015   CLINICAL DATA:  Right hip fracture. Intraoperative images of fracture fixation. Initial encounter.  EXAM: OPERATIVE RIGHT HIP (WITH PELVIS IF PERFORMED) 10 VIEWS  TECHNIQUE: Fluoroscopic spot image(s) were submitted for interpretation post-operatively.  COMPARISON:  Plain films of the right hip 01/08/2015.  FINDINGS: Provided images demonstrate placement of a short intra medullary nail and hip screw for fixation of an intertrochanteric fracture. Hardware appears intact. No acute abnormality is identified. Position and alignment of the patient's fracture are markedly improved.  IMPRESSION: ORIF right hip fracture.  No acute finding.   Electronically Signed   By: [name] M.D.   On: 01/09/2015 09:55   Dg Hip Unilat With Pelvis 2-3 Views Right  01/08/2015   CLINICAL DATA:  Pain following fall  EXAM: DG HIP (WITH OR WITHOUT PELVIS) 2-3V RIGHT  COMPARISON:  None.  FINDINGS: Frontal pelvis as well as frontal and lateral right hip images were obtained. There is a comminuted intertrochanteric femur fracture on the right with varus angulation at the fracture site. No other fractures are identified. No dislocation. Bones are osteoporotic. There is mild narrowing of both hip joints.  IMPRESSION: Comminuted intertrochanteric femur fracture on the right with varus angulation of the fracture site. No dislocation. Narrowing both hip joints. Bones osteoporotic.   Electronically Signed   By: Lowella Grip III M.D.   On: 01/08/2015 15:46     ASSESSMENT AND PLAN:   Principal Problem:   Closed right hip fracture  1. Right hip fracture status post ORIF. Continue DVT prophylaxis and pain control hardly swallowing. Continue IV fluids postoperatively., Continue incentive spirometry. #2 hypertension ; hypotensive now continue IV fluids for  BV medications. 3 CK stage III monitor kidney function closely #4 CODE STATUS DO NOT RESUSCITATE. #5 history of dementia continue the Aricept, Celexa.      All the records are reviewed and case discussed with Care Management/Social Workerr. Management plans discussed with the patient, family and they are in agreement.  CODE STATUS: DO NOT RESUSCITATE  TOTAL TIME TAKING CARE OF THIS PATIENT: 74minutes.   POSSIBLE D/C IN 1-2AYS, DEPENDING ON CLINICAL CONDITION.   [name] M.D on 01/09/2015 at 11:13 AM  Between 7am to 6pm - Pager - [phone]  After 6pm go to [email or web address] - password [email or web address] [facility]  Office  [phone]  CC: Primary care physician; [name], MD

## 2015-01-09 NOTE — Progress Notes (Signed)
Pt arrived on unit. Per report she received 1 unit of blood in OR, and is receiving  1 more unit that was started in the PACU.  Will continue to monitor.

## 2015-01-10 LAB — TYPE AND SCREEN
ABO/RH(D): A POS
ANTIBODY SCREEN: NEGATIVE
UNIT DIVISION: 0
Unit division: 0
Unit division: 0

## 2015-01-10 LAB — CBC
HCT: 26.7 % — ABNORMAL LOW (ref 35.0–47.0)
Hemoglobin: 9 g/dL — ABNORMAL LOW (ref 12.0–16.0)
MCH: 30 pg (ref 26.0–34.0)
MCHC: 33.9 g/dL (ref 32.0–36.0)
MCV: 88.4 fL (ref 80.0–100.0)
PLATELETS: 90 10*3/uL — AB (ref 150–440)
RBC: 3.01 MIL/uL — AB (ref 3.80–5.20)
RDW: 16.9 % — AB (ref 11.5–14.5)
WBC: 7.7 10*3/uL (ref 3.6–11.0)

## 2015-01-10 LAB — BASIC METABOLIC PANEL
Anion gap: 6 (ref 5–15)
BUN: 16 mg/dL (ref 6–20)
CO2: 23 mmol/L (ref 22–32)
Calcium: 7.8 mg/dL — ABNORMAL LOW (ref 8.9–10.3)
Chloride: 106 mmol/L (ref 101–111)
Creatinine, Ser: 1.58 mg/dL — ABNORMAL HIGH (ref 0.44–1.00)
GFR calc Af Amer: 32 mL/min — ABNORMAL LOW (ref >60)
GFR, EST NON AFRICAN AMERICAN: 28 mL/min — AB (ref >60)
Glucose, Bld: 113 mg/dL — ABNORMAL HIGH (ref 65–99)
POTASSIUM: 4 mmol/L (ref 3.5–5.1)
Sodium: 135 mmol/L (ref 135–145)

## 2015-01-10 NOTE — Progress Notes (Signed)
[facility] [place] NAME: Christina Hahn    MR#:  [id number]  DATE OF BIRTH:  10-12-1922  SUBJECTIVE: Admitted for right hip fracture status  postop day 1. Patient complains of right hip pain. Otherwise denies any complaints.   CHIEF COMPLAINT:   Chief Complaint  Patient presents with  . Fall    REVIEW OF SYSTEMS:    Review of Systems  Constitutional: Negative for fever and chills.  HENT: Negative for hearing loss.   Eyes: Negative for blurred vision, double vision and photophobia.  Respiratory: Negative for cough, hemoptysis and shortness of breath.   Cardiovascular: Negative for palpitations, orthopnea and leg swelling.  Gastrointestinal: Negative for vomiting, abdominal pain and diarrhea.  Genitourinary: Negative for dysuria and urgency.  Musculoskeletal: Positive for joint pain and falls. Negative for myalgias and neck pain.  Skin: Negative for rash.  Neurological: Negative for dizziness, focal weakness, seizures, weakness and headaches.  Psychiatric/Behavioral: Positive for memory loss. The patient does not have insomnia.     Nutrition:  Tolerating Diet: Tolerating PT:      DRUG ALLERGIES:   Allergies  Allergen Reactions  . Antivert [Meclizine] Other (See Comments)    Reaction:  Dizziness   . Codeine Other (See Comments)    Reaction:  Gives pt nightmares   . Dramamine [Dimenhydrinate] Other (See Comments)    Reaction:  Dizziness   . Flonase [Fluticasone Propionate] Other (See Comments)    Reaction:  Unknown   . Nsaids Other (See Comments)    Reaction:  Unknown   . Penicillins Other (See Comments)    Reaction:  Unknown   . Shrimp [Shellfish Allergy] Other (See Comments)    Reaction:  Unknown   . Tape Other (See Comments)    Reaction:  Tears pts skin   . Voltaren [Diclofenac Sodium] Other (See Comments)    Reaction:  Unknown     VITALS:  Blood pressure 126/48, pulse 87, temperature 98.6 F (37 C),  temperature source Oral, resp. rate 18, height 5\' 5"  (1.651 m), weight 71.668 kg (158 lb), SpO2 97 %.  PHYSICAL EXAMINATION:   Physical Exam  GENERAL:  81 y.o.-year-old patient lying in the bed with no acute distress.  EYES: Pupils equal, round, reactive to light and accommodation. No scleral icterus. Extraocular muscles intact.  HEENT: Head atraumatic, normocephalic. Oropharynx and nasopharynx clear.  NECK:  Supple, no jugular venous distention. No thyroid enlargement, no tenderness.  LUNGS: Normal breath sounds bilaterally, no wheezing, rales,rhonchi or crepitation. No use of accessory muscles of respiration.  CARDIOVASCULAR: S1, S2 normal. No murmurs, rubs, or gallops.  ABDOMEN: Soft, nontender, nondistended. Bowel sounds present. No organomegaly or mass.  EXTREMITIES: Right hip dressing present NEUROLOGIC: Cranial nerves II through XII are intact. Muscle strength 5/5 in all extremities. Sensation intact. Gait not checked.  PSYCHIATRIC:   SKIN: No obvious rash, lesion, or ulcer.    LABORATORY PANEL:   CBC  Recent Labs Lab 01/10/15 0439  WBC 7.7  HGB 9.0*  HCT 26.7*  PLT 90*   ------------------------------------------------------------------------------------------------------------------  Chemistries   Recent Labs Lab 01/10/15 0439  NA 135  K 4.0  CL 106  CO2 23  GLUCOSE 113*  BUN 16  CREATININE 1.58*  CALCIUM 7.8*   ------------------------------------------------------------------------------------------------------------------  Cardiac Enzymes  Recent Labs Lab 01/08/15 1517  TROPONINI <0.03   ------------------------------------------------------------------------------------------------------------------  RADIOLOGY:  Dg Chest 1 View  01/08/2015   CLINICAL DATA:  Preop for hip fracture.  EXAM:  CHEST  1 VIEW  COMPARISON:  January 04, 2015.  FINDINGS: Stable cardiomediastinal silhouette. No pneumothorax or pleural effusion is noted. Stable hiatal hernia  is noted. No acute pulmonary disease is noted. Bony thorax is intact.  IMPRESSION: Stable hiatal hernia.  No acute cardiopulmonary abnormality seen.   Electronically Signed   By: Marijo Conception, M.D.   On: 01/08/2015 15:47   Dg Hip Operative Unilat With Pelvis Right  01/09/2015   CLINICAL DATA:  Right hip fracture. Intraoperative images of fracture fixation. Initial encounter.  EXAM: OPERATIVE RIGHT HIP (WITH PELVIS IF PERFORMED) 10 VIEWS  TECHNIQUE: Fluoroscopic spot image(s) were submitted for interpretation post-operatively.  COMPARISON:  Plain films of the right hip 01/08/2015.  FINDINGS: Provided images demonstrate placement of a short intra medullary nail and hip screw for fixation of an intertrochanteric fracture. Hardware appears intact. No acute abnormality is identified. Position and alignment of the patient's fracture are markedly improved.  IMPRESSION: ORIF right hip fracture.  No acute finding.   Electronically Signed   By: [name] M.D.   On: 01/09/2015 09:55   Dg Hip Unilat With Pelvis 2-3 Views Right  01/08/2015   CLINICAL DATA:  Pain following fall  EXAM: DG HIP (WITH OR WITHOUT PELVIS) 2-3V RIGHT  COMPARISON:  None.  FINDINGS: Frontal pelvis as well as frontal and lateral right hip images were obtained. There is a comminuted intertrochanteric femur fracture on the right with varus angulation at the fracture site. No other fractures are identified. No dislocation. Bones are osteoporotic. There is mild narrowing of both hip joints.  IMPRESSION: Comminuted intertrochanteric femur fracture on the right with varus angulation of the fracture site. No dislocation. Narrowing both hip joints. Bones osteoporotic.   Electronically Signed   By: Lowella Grip III M.D.   On: 01/08/2015 15:46     ASSESSMENT AND PLAN:   Principal Problem:   Closed right hip fracture  1. Right hip fracture status post ORIF. Continue DVT prophylaxis and pain control , PT evaluation today. . I#2  hypertension ; hypotensive now continue IV fluid 3 CK stage III monitor kidney function closely, stable. #4 CODE STATUS DO NOT RESUSCITATE. #5 history of dementia continue the Aricept, Celexa. #6 acute blood loss anemia due to surgery. Status post 1 unit of transfusion.     All the records are reviewed and case discussed with Care Management/Social Workerr. Management plans discussed with the patient, family and they are in agreement.  CODE STATUS: DO NOT RESUSCITATE  TOTAL TIME TAKING CARE OF THIS PATIENT: 3minutes.   POSSIBLE D/C IN 1-2AYS, DEPENDING ON CLINICAL CONDITION.   [name] M.D on 01/10/2015 at 9:49 AM  Between 7am to 6pm - Pager - [phone]  After 6pm go to [email or web address] - password [email or web address] [facility]  Office  [phone]  CC: Primary care physician; [name], MD

## 2015-01-10 NOTE — Plan of Care (Signed)
Problem: Acute Rehab PT Goals(only PT should resolve) Goal: Pt Will Ambulate With WBing as ordered on RLE

## 2015-01-10 NOTE — Care Management Note (Signed)
Case Management Note  Patient Details  Name: Christina Hahn MRN: [id number] Date of Birth: [birth year]  Subjective/Objective:      81yo Ms [name] was admitted 01/08/15 with a right               Action/Plan:   Expected Discharge Date:                  Expected Discharge Plan:     In-House Referral:     Discharge planning Services     Post Acute Care Choice:    Choice offered to:     DME Arranged:    DME Agency:     HH Arranged:    [place] Agency:     Status of Service:     Medicare Important Message Given:    Date Medicare IM Given:    Medicare IM give by:    Date Additional Medicare IM Given:    Additional Medicare Important Message give by:     If discussed at [place] of Stay Meetings, dates discussed:    Additional Comments:  Romy Mcgue A, RN 01/10/2015, 11:19 AM

## 2015-01-10 NOTE — Clinical Social Work Note (Signed)
Clinical Social Work Assessment  Patient Details  Name: Christina Hahn MRN: [id number] Date of Birth: 02-03-1923  Date of referral:  01/10/15               Reason for consult:  Facility Placement                Permission sought to share information with:  Family Supports Permission granted to share information::  Yes, Verbal Permission Granted  Name::      (Daughter)   Housing/Transportation Living arrangements for the past 2 months:  [place] of Information:  Patient, Adult Children Patient Interpreter Needed:  None Criminal Activity/Legal Involvement Pertinent to Current Situation/Hospitalization:  No - Comment as needed Significant Relationships:  Adult Children Lives with:  Facility Resident Do you feel safe going back to the place where you live?  Yes Need for family participation in patient care:  Yes (Comment)  Care giving concerns:  No caregiver needs identified.  Social Worker assessment / plan:  CSW met with pt and daughter to address consult for SNF. CSW introduced herself and explained role of social work. Pt has been to SNF in the past. Pt's daughter is familiar with the process of discharging to SNF. Pt is a long term resident of [facility] and will likely return after STR.   CSW will initiate SNF search and will follow up with bed offers. CSW will continue to follow.   Employment status:  Retired Forensic scientist:  Information systems manager, Medicaid In [place] PT Recommendations:  [place] / Referral to community resources:  [place]  Patient/Family's Response to care:  Pt's daughter was pleasant, however concerned that pt is in her copay days. CSW will clarify this in referral to SNFs.   Patient/Family's Understanding of and Emotional Response to Diagnosis, Current Treatment, and Prognosis:  Pt and daughter understand that pt requires SNF STR due to hip fracture.   Emotional Assessment Appearance:  Appears  stated age Attitude/Demeanor/Rapport:   (Pleasant, but in pain) Affect (typically observed):  Accepting, Adaptable Orientation:  Oriented to Self, Oriented to Place Alcohol / Substance use:  Never Used Psych involvement (Current and /or in the community):  No (Comment)  Discharge Needs  Concerns to be addressed:  Adjustment to Illness Readmission within the last 30 days:  No Current discharge risk:  Chronically ill Barriers to Discharge:  Other (May be in Medicaid copya days. )   Darden Dates, LCSW 01/10/2015, 4:27 PM

## 2015-01-10 NOTE — Progress Notes (Signed)
Subjective: 1 Day Post-Op Procedure(s) (LRB): INTRAMEDULLARY (IM) NAIL INTERTROCHANTRIC (Right)    Patient reports pain as mild. hgb 9.0.  Platelets 90,000. Afebrile  Objective:   VITALS:   Filed Vitals:   01/10/15 1111  BP: 107/48  Pulse: 94  Temp: 99.1 F (37.3 C)  Resp: 18    Neurologically intact Neurovascular intact Sensation intact distally Intact pulses distally Dorsiflexion/Plantar flexion intact  Dressing dry.  LABS  Recent Labs  01/08/15 1517 01/09/15 0507 01/09/15 1514 01/10/15 0439  HGB 10.1* 8.3* 9.8* 9.0*  HCT 30.3* 25.2* 28.7* 26.7*  WBC 5.9 6.6  --  7.7  PLT 149* 129*  --  90*     Recent Labs  01/08/15 1517 01/09/15 0507 01/10/15 0439  NA 138 138 135  K 4.1 4.5 4.0  BUN 15 16 16   CREATININE 1.87* 1.63* 1.58*  GLUCOSE 109* 134* 113*     Recent Labs  01/08/15 1517  INR 0.97     Assessment/Plan: 1 Day Post-Op Procedure(s) (LRB): INTRAMEDULLARY (IM) NAIL INTERTROCHANTRIC (Right)   Discharge to SNF when ready. oob in chair and start PT.

## 2015-01-10 NOTE — Evaluation (Signed)
Physical Therapy Evaluation Patient Details Name: Christina Hahn MRN: [id number] DOB: [birth year] Today's Date: 01/10/2015   History of Present Illness  81yo female with onset of femoral fracture with IM nailing, has PWB and Buck's traction on in bed.  PMHx:  CKD, stroke, dementia, sepsis with rehab admission when pt fell  Clinical Impression  Pt was seen for assessment of her R hip and was not able to get up to bedside.  Due to her fatigue and pain complaints with any movement she is not willing to get up to beside.  Daughter in to talk to pt but not able to get more willingness to move from pt.  Will encourage her to try to move more as able.    Follow Up Recommendations SNF    Equipment Recommendations  None recommended by PT    Recommendations for Other Services       Precautions / Restrictions Precautions Precautions: Fall Required Braces or Orthoses: Other Brace/Splint (Buck's traction when in bed) Restrictions Weight Bearing Restrictions: Yes Other Position/Activity Restrictions: PWB RLE      Mobility  Bed Mobility Overal bed mobility: Needs Assistance;+2 for physical assistance;+ 2 for safety/equipment                Transfers Overall transfer level: Needs assistance                  Ambulation/Gait             General Gait Details: Pt not willing to attempt  Stairs            Wheelchair Mobility    Modified Rankin (Stroke Patients Only)       Balance                                             Pertinent Vitals/Pain Pain Assessment: Faces Faces Pain Scale: Hurts even more Pain Location: R hip with resisted movement (pt resisting PT) Pain Intervention(s): Limited activity within patient's tolerance;Monitored during session;Premedicated before session;Repositioned;Other (comment) (traction reapplied end of visit)    Home Living Family/patient expects to be discharged to:: Skilled nursing facility                      Prior Function Level of Independence: Needs assistance   Gait / Transfers Assistance Needed: Per the family she was using a walker to move throughout her room.   ADL's / Homemaking Assistance Needed: Patient requires assistance for some ADLs, likely primarily for cognitive reasons. Per the family she has been fairly independent with mobility.   Comments: In SNF when she sustained a fall     Hand Dominance        Extremity/Trunk Assessment   Upper Extremity Assessment: Overall WFL for tasks assessed           Lower Extremity Assessment: RLE deficits/detail RLE Deficits / Details: New IM nailing after femoral fracture       Communication   Communication: No difficulties  Cognition Arousal/Alertness: Awake/alert Behavior During Therapy: Anxious;Flat affect Overall Cognitive Status: Within Functional Limits for tasks assessed       Memory: Decreased recall of precautions;Decreased short-term memory              General Comments General comments (skin integrity, edema, etc.): Pt was able to assist with RLE movement but actively resists any attempts  to move her.  Refused to try to get up but at one point did want to do so.      Exercises General Exercises - Lower Extremity Ankle Circles/Pumps: AAROM;Both;10 reps Quad Sets: AAROM;Both;10 reps Short Arc Quad: AROM;AAROM;Right;10 reps Heel Slides: AAROM;Right;10 reps Hip ABduction/ADduction: AAROM;Right;10 reps Straight Leg Raises: AAROM;Right;10 reps      Assessment/Plan    PT Assessment Patient needs continued PT services  PT Diagnosis Acute pain;Altered mental status   PT Problem List Decreased strength;Decreased range of motion;Decreased activity tolerance;Decreased balance;Decreased mobility;Decreased coordination;Decreased cognition;Decreased knowledge of use of DME;Decreased safety awareness;Decreased knowledge of precautions;Decreased skin integrity;Pain  PT Treatment Interventions DME  instruction;Gait training;Functional mobility training;Therapeutic activities;Therapeutic exercise;Balance training;Neuromuscular re-education;Cognitive remediation;Patient/family education   PT Goals (Current goals can be found in the Care Plan section) Acute Rehab PT Goals Patient Stated Goal: to get up to chair but changed her mind PT Goal Formulation: With patient/family Time For Goal Achievement: 01/24/15 Potential to Achieve Goals: Good    Frequency BID   Barriers to discharge Other (comment) (Pt was already at SNF when fall occurred)      Co-evaluation               End of Session Equipment Utilized During Treatment: Oxygen Activity Tolerance: Patient limited by pain;Treatment limited secondary to agitation Patient left: in bed;with call bell/phone within reach;with bed alarm set;with family/visitor present Nurse Communication: Mobility status;Weight bearing status         Time: 1155-2080 PT Time Calculation (min) (ACUTE ONLY): 33 min   Charges:   PT Evaluation $Initial PT Evaluation Tier I: 1 Procedure PT Treatments $Therapeutic Exercise: 8-22 mins   PT G Codes:        Christina, Hahn 01-23-2015, 10:56 AM   Mee Hives, PT MS Acute Rehab Dept. Number: [facility] [id number] and [place] [phone]

## 2015-01-11 ENCOUNTER — Encounter: Payer: Self-pay | Admitting: Specialist

## 2015-01-11 LAB — BASIC METABOLIC PANEL
ANION GAP: 7 (ref 5–15)
BUN: 17 mg/dL (ref 6–20)
CO2: 24 mmol/L (ref 22–32)
Calcium: 7.8 mg/dL — ABNORMAL LOW (ref 8.9–10.3)
Chloride: 106 mmol/L (ref 101–111)
Creatinine, Ser: 1.37 mg/dL — ABNORMAL HIGH (ref 0.44–1.00)
GFR, EST AFRICAN AMERICAN: 38 mL/min — AB (ref >60)
GFR, EST NON AFRICAN AMERICAN: 33 mL/min — AB (ref >60)
GLUCOSE: 106 mg/dL — AB (ref 65–99)
Potassium: 4 mmol/L (ref 3.5–5.1)
Sodium: 137 mmol/L (ref 135–145)

## 2015-01-11 LAB — CBC
HCT: 24.3 % — ABNORMAL LOW (ref 35.0–47.0)
HEMOGLOBIN: 8.2 g/dL — AB (ref 12.0–16.0)
MCH: 30.4 pg (ref 26.0–34.0)
MCHC: 33.8 g/dL (ref 32.0–36.0)
MCV: 89.7 fL (ref 80.0–100.0)
PLATELETS: 90 10*3/uL — AB (ref 150–440)
RBC: 2.71 MIL/uL — ABNORMAL LOW (ref 3.80–5.20)
RDW: 16.5 % — ABNORMAL HIGH (ref 11.5–14.5)
WBC: 8 10*3/uL (ref 3.6–11.0)

## 2015-01-11 MED ORDER — CETYLPYRIDINIUM CHLORIDE 0.05 % MT LIQD
7.0000 mL | Freq: Two times a day (BID) | OROMUCOSAL | Status: DC
  Administered 2015-01-11 – 2015-01-12 (×3): 7 mL via OROMUCOSAL

## 2015-01-11 NOTE — Progress Notes (Signed)
[facility] at [place] NAME: Christina Hahn    MR#:  [id number]  DATE OF BIRTH:  Mar 24, 1923  SUBJECTIVE: Seen today patient denies any complaints of nausea no vomiting no chest pain no cough no shortness of breath .postop day 2 of right hip fracture repair.   CHIEF COMPLAINT:   Chief Complaint  Patient presents with  . Fall    REVIEW OF SYSTEMS:    Review of Systems  Constitutional: Negative for fever and chills.  HENT: Negative for hearing loss.   Eyes: Negative for blurred vision, double vision and photophobia.  Respiratory: Negative for cough, hemoptysis and shortness of breath.   Cardiovascular: Negative for palpitations, orthopnea and leg swelling.  Gastrointestinal: Negative for vomiting, abdominal pain and diarrhea.  Genitourinary: Negative for dysuria and urgency.  Musculoskeletal: Positive for joint pain and falls. Negative for myalgias and neck pain.  Skin: Negative for rash.  Neurological: Negative for dizziness, focal weakness, seizures, weakness and headaches.  Psychiatric/Behavioral: Positive for memory loss. The patient does not have insomnia.     Nutrition:  Tolerating Diet: Tolerating PT:      DRUG ALLERGIES:   Allergies  Allergen Reactions  . Antivert [Meclizine] Other (See Comments)    Reaction:  Dizziness   . Codeine Other (See Comments)    Reaction:  Gives pt nightmares   . Dramamine [Dimenhydrinate] Other (See Comments)    Reaction:  Dizziness   . Flonase [Fluticasone Propionate] Other (See Comments)    Reaction:  Unknown   . Nsaids Other (See Comments)    Reaction:  Unknown   . Penicillins Other (See Comments)    Reaction:  Unknown   . Shrimp [Shellfish Allergy] Other (See Comments)    Reaction:  Unknown   . Tape Other (See Comments)    Reaction:  Tears pts skin   . Voltaren [Diclofenac Sodium] Other (See Comments)    Reaction:  Unknown     VITALS:  Blood pressure 105/40, pulse 105,  temperature 98.7 F (37.1 C), temperature source Axillary, resp. rate 20, height 5\' 5"  (1.651 m), weight 71.668 kg (158 lb), SpO2 90 %.  PHYSICAL EXAMINATION:   Physical Exam  GENERAL:  81 y.o.-year-old patient lying in the bed with no acute distress.  EYES: Pupils equal, round, reactive to light and accommodation. No scleral icterus. Extraocular muscles intact.  HEENT: Head atraumatic, normocephalic. Oropharynx and nasopharynx clear.  NECK:  Supple, no jugular venous distention. No thyroid enlargement, no tenderness.  LUNGS: Normal breath sounds bilaterally, no wheezing, rales,rhonchi or crepitation. No use of accessory muscles of respiration.  CARDIOVASCULAR: S1, S2 normal. No murmurs, rubs, or gallops.  ABDOMEN: Soft, nontender, nondistended. Bowel sounds present. No organomegaly or mass.  EXTREMITIES: Right hip dressing present NEUROLOGIC: Cranial nerves II through XII are intact. Muscle strength 5/5 in all extremities. Sensation intact. Gait not checked.  PSYCHIATRIC:   SKIN: No obvious rash, lesion, or ulcer.    LABORATORY PANEL:   CBC  Recent Labs Lab 01/11/15 0431  WBC 8.0  HGB 8.2*  HCT 24.3*  PLT 90*   ------------------------------------------------------------------------------------------------------------------  Chemistries   Recent Labs Lab 01/11/15 0431  NA 137  K 4.0  CL 106  CO2 24  GLUCOSE 106*  BUN 17  CREATININE 1.37*  CALCIUM 7.8*   ------------------------------------------------------------------------------------------------------------------  Cardiac Enzymes  Recent Labs Lab 01/08/15 1517  TROPONINI <0.03   ------------------------------------------------------------------------------------------------------------------  RADIOLOGY:  Dg Hip Operative Unilat With Pelvis Right  01/09/2015  CLINICAL DATA:  Right hip fracture. Intraoperative images of fracture fixation. Initial encounter.  EXAM: OPERATIVE RIGHT HIP (WITH PELVIS IF  PERFORMED) 10 VIEWS  TECHNIQUE: Fluoroscopic spot image(s) were submitted for interpretation post-operatively.  COMPARISON:  Plain films of the right hip 01/08/2015.  FINDINGS: Provided images demonstrate placement of a short intra medullary nail and hip screw for fixation of an intertrochanteric fracture. Hardware appears intact. No acute abnormality is identified. Position and alignment of the patient's fracture are markedly improved.  IMPRESSION: ORIF right hip fracture.  No acute finding.   Electronically Signed   By: [name] M.D.   On: 01/09/2015 09:55     ASSESSMENT AND PLAN:   Principal Problem:   Closed right hip fracture  1. Right hip fracture status post ORIF. Continue DVT prophylaxis and pain control physical therapy recommends replacement social worker is working on that.  I#2 hypertension ; hypotension is improved with fluids. 3 CK stage III monitor kidney function closely, stable. #4 CODE STATUS DO NOT RESUSCITATE. #5 history of dementia continue the Aricept, Celexa. #6 acute blood loss anemia due to surgery. Status post 1 unit of transfusion hemoglobin is stable. Disposition pending the placement most likely it will be tomorrow.     All the records are reviewed and case discussed with Care Management/Social Workerr. Management plans discussed with the patient, family and they are in agreement.  CODE STATUS: DO NOT RESUSCITATE  TOTAL TIME TAKING CARE OF THIS PATIENT: 70minutes.   POSSIBLE D/C IN 1-2AYS, DEPENDING ON CLINICAL CONDITION.   [name] M.D on 01/11/2015 at 9:23 AM  Between 7am to 6pm - Pager - [phone]  After 6pm go to [email or web address] - password [email or web address]  Office  [phone]  CC: Primary care physician; [name], MD

## 2015-01-11 NOTE — Evaluation (Signed)
Occupational Therapy Evaluation Patient Details Name: Christina Hahn MRN: [id number] DOB: 12/21/1922 Today's Date: 01/11/2015    History of Present Illness This patient is a 81 year old female who came to [facility] with a fall suffering a  femoral fracture with IM nailing.  She has a history of  CKD, stroke, dementia, sepsis with rehab admission.   Clinical Impression    This patient is a 81 year old female with the above history. She had been living in a memory care unit getting assist with her activities of daily living but was feeding herself. She now is having trouble as per daughter. Issued a built up handle for spoon/fork. Patient would benefit from Occupational Therapy for ADL/functioal mobility training     Follow Up Recommendations       Equipment Recommendations       Recommendations for Other Services       Precautions / Restrictions Precautions Precautions: Fall Restrictions Weight Bearing Restrictions: Yes RLE Weight Bearing: Partial weight bearing      Mobility Bed Mobility                  Transfers                      Balance                                            ADL                                         General ADL Comments: Had been recieving assist for dressing and bathing. Now having trouble with self feeding as per daughter. Issued a built up handle for [employer]      Pertinent Vitals/Pain       Hand Dominance     Extremity/Trunk Assessment Upper Extremity Assessment Upper Extremity Assessment:  (B UE 4/5)   Lower Extremity Assessment Lower Extremity Assessment: Defer to PT evaluation       Communication Communication Communication: No difficulties   Cognition Arousal/Alertness: Awake/alert Behavior During Therapy: WFL for tasks assessed/performed Overall Cognitive Status: Within Functional Limits for tasks  assessed                     General Comments       Exercises       Shoulder Instructions      Home Living Family/patient expects to be discharged to:: Skilled nursing facility                                        Prior Functioning/Environment Level of Independence: Needs assistance    ADL's / Homemaking Assistance Needed: Patient in a memory care unit as per daughter when she fell        OT Diagnosis: Generalized weakness   OT Problem List: Decreased strength;Decreased activity tolerance;Decreased safety awareness;Decreased cognition;Pain   OT Treatment/Interventions: Self-care/ADL training    OT Goals(Current goals can be found in the care plan section) Acute Rehab OT Goals OT Goal Formulation: With family Time For Goal  Achievement: 01/25/15 Potential to Achieve Goals: Good  OT Frequency: Min 1X/week   Barriers to D/C:            Co-evaluation              End of Session Equipment Utilized During Treatment:  (Built up handle for spoon and fork)  Activity Tolerance:   Patient left: in bed;with call bell/phone within reach;with bed alarm set;with family/visitor present   Time: 5993-5701 OT Time Calculation (min): 21 min Charges:  OT General Charges $OT Visit: 1 Procedure OT Evaluation $Initial OT Evaluation Tier I: 1 Procedure OT Treatments $Self Care/Home Management : 8-22 mins G-Codes:    Myrene Galas, MS/OTR/L  01/11/2015, 10:24 AM

## 2015-01-11 NOTE — Progress Notes (Signed)
Subjective: 2 Days Post-Op Procedure(s) (LRB): INTRAMEDULLARY (IM) NAIL INTERTROCHANTRIC (Right)    Patient reports pain as mild.Some nausea today.  hgb 8.2 Slow progress with PT.   Objective:   VITALS:   Filed Vitals:   01/11/15 0830  BP: 105/40  Pulse:   Temp:   Resp:     Neurologically intact Sensation intact distally Intact pulses distally Dorsiflexion/Plantar flexion intact No cellulitis present  LABS  Recent Labs  01/09/15 0507 01/09/15 1514 01/10/15 0439 01/11/15 0431  HGB 8.3* 9.8* 9.0* 8.2*  HCT 25.2* 28.7* 26.7* 24.3*  WBC 6.6  --  7.7 8.0  PLT 129*  --  90* 90*     Recent Labs  01/09/15 0507 01/10/15 0439 01/11/15 0431  NA 138 135 137  K 4.5 4.0 4.0  BUN 16 16 17   CREATININE 1.63* 1.58* 1.37*  GLUCOSE 134* 113* 106*     Recent Labs  01/08/15 1517  INR 0.97     Assessment/Plan: 2 Days Post-Op Procedure(s) (LRB): INTRAMEDULLARY (IM) NAIL INTERTROCHANTRIC (Right)   Up with therapy Discharge to SNF tomorrow.  Hopefully

## 2015-01-11 NOTE — Clinical Social Work Placement (Signed)
   CLINICAL SOCIAL WORK PLACEMENT  NOTE  Date:  01/11/2015  Patient Details  Name: HAYLO FAKE MRN: [id number] Date of Birth: [birth year]  Clinical Social Work is seeking post-discharge placement for this patient at the [facility] level of care (*CSW will initial, date and re-position this form in  chart as items are completed):  Yes   Patient/family provided with [facility] Work Department's list of facilities offering this level of care within the geographic area requested by the patient (or if unable, by the patient's family).  Yes   Patient/family informed of their freedom to choose among providers that offer the needed level of care, that participate in Medicare, Medicaid or managed care program needed by the patient, have an available bed and are willing to accept the patient.  Yes   Patient/family informed of [facility]'s ownership interest in [facility] and [facility], as well as of the fact that they are under no obligation to receive care at these facilities.  PASRR submitted to EDS on 01/11/15     PASRR number received on 01/11/15     Existing PASRR number confirmed on       FL2 transmitted to all facilities in geographic area requested by pt/family on 01/10/15     FL2 transmitted to all facilities within larger geographic area on       Patient informed that his/her managed care company has contracts with or will negotiate with certain facilities, including the following:        Yes   Patient/family informed of bed offers received.  Patient chooses bed at       Physician recommends and patient chooses bed at      Patient to be transferred to   on  .  Patient to be transferred to facility by       Patient family notified on   of transfer.  Name of family member notified:        PHYSICIAN       Additional Comment:    _______________________________________________ Loralyn Freshwater, LCSW 01/11/2015, 11:41 AM

## 2015-01-11 NOTE — Progress Notes (Signed)
Physical Therapy Treatment Patient Details Name: Christina Hahn MRN: [id number] DOB: 1922/08/16 Today's Date: 01/11/2015    History of Present Illness 81yo female with onset of femoral fracture with IM nailing, has PWB and Buck's traction on in bed.  PMHx:  CKD, stroke, dementia, sepsis with rehab admission when pt fell    PT Comments    Daughter notes pt having more pain today. Pt medicated during session. Pt performs exercises with gentle encouragement. Grimacing at times with pain. Tolerates with rest. Plan to see pt this p.m and attempt out of bed activities.   Follow Up Recommendations  SNF     Equipment Recommendations       Recommendations for Other Services       Precautions / Restrictions Precautions Precautions: Fall Restrictions Weight Bearing Restrictions: Yes RLE Weight Bearing: Partial weight bearing    Mobility  Bed Mobility Overal bed mobility: Needs Assistance;+2 for physical assistance;+ 2 for safety/equipment                Transfers                    Ambulation/Gait                 Stairs            Wheelchair Mobility    Modified Rankin (Stroke Patients Only)       Balance                                    Cognition Arousal/Alertness: Awake/alert Behavior During Therapy: WFL for tasks assessed/performed Overall Cognitive Status: History of cognitive impairments - at baseline                      Exercises General Exercises - Lower Extremity Ankle Circles/Pumps: AAROM;Both;15 reps;Supine Short Arc Quad: AAROM;Both;15 reps;Supine Heel Slides: AAROM;Both;15 reps;Supine Hip ABduction/ADduction: AAROM;Both;15 reps;Supine Straight Leg Raises: AAROM;Both;15 reps;Supine    General Comments        Pertinent Vitals/Pain Pain Score:  (a lot) Pain Location: R hip Pain Intervention(s): Limited activity within patient's tolerance    Home Living Family/patient expects to be discharged  to:: Skilled nursing facility                    Prior Function Level of Independence: Needs assistance    ADL's / Homemaking Assistance Needed: Patient in a memory care unit as per daughter when she fell     PT Goals (current goals can now be found in the care plan section) Progress towards PT goals: Progressing toward goals    Frequency  BID    PT Plan Current plan remains appropriate    Co-evaluation             End of Session   Activity Tolerance: Patient limited by pain;Treatment limited secondary to agitation (pt often says thats enough and leave it alone. Some grimace) Patient left: in bed;with call bell/phone within reach;with bed alarm set;with family/visitor present     Time: 1036-1100 PT Time Calculation (min) (ACUTE ONLY): 24 min  Charges:  $Therapeutic Exercise: 23-37 mins                    G Codes:      Charlaine Dalton 01/11/2015, 11:08 AM

## 2015-01-11 NOTE — Progress Notes (Signed)
Clinical Social Worker (CSW) met with patient and her daughter [name] to present bed offers. Per [facility] they can accept patient however she will have a co-pay of $161 per day since she has used 20 days at [facility] recently. Per [facility] and [facility] they can accept patient and she will not have a co-pay because they can complete paper work for her Medicaid to pick up co-pays. Daughter [name] is aware of above. Per [name] she would like to discuss information with her sisters. CSW will continue to follow and assist as needed.   Blima Rich, [place] [phone]

## 2015-01-11 NOTE — Progress Notes (Signed)
Patient's daughter [name] chose [facility]. Per MD patient will be ready for D/C tomorrow 01/12/15. [facility] liaison aware of above. CSW will continue to follow and assist as needed.   Blima Rich, [place] [phone]

## 2015-01-11 NOTE — Anesthesia Postprocedure Evaluation (Signed)
  Anesthesia Post-op Note  Patient: Christina Hahn  Procedure(s) Performed: Procedure(s): INTRAMEDULLARY (IM) NAIL INTERTROCHANTRIC (Right)  Anesthesia type:Spinal  Patient location: PACU  Post pain: Pain level controlled  Post assessment: Post-op Vital signs reviewed, Patient's Cardiovascular Status Stable, Respiratory Function Stable, Patent Airway and No signs of Nausea or vomiting  Post vital signs: Reviewed and stable  Last Vitals:  Filed Vitals:   01/11/15 0018  BP: 113/42  Pulse: 93  Temp: 36.7 C  Resp: 18    Level of consciousness: awake, alert  and patient cooperative  Complications: No apparent anesthesia complications

## 2015-01-11 NOTE — Progress Notes (Signed)
Physical Therapy Treatment Patient Details Name: Christina Hahn MRN: [id number] DOB: 08-11-1922 Today's Date: 01/11/2015    History of Present Illness 81yo female with onset of femoral fracture with IM nailing, has PWB and Buck's traction on in bed.  PMHx:  CKD, stroke, dementia, sepsis with rehab admission when pt fell    PT Comments    Pt more aware this pm. Agreeable to PT. Better able to follow instructions for exercises. Sits edge of bed with exercises and 2 attempts to stand achieving partial stand with 2 person assist.   Follow Up Recommendations  SNF     Equipment Recommendations  None recommended by PT    Recommendations for Other Services       Precautions / Restrictions Precautions Precautions: Fall Restrictions Weight Bearing Restrictions: Yes RLE Weight Bearing: Partial weight bearing    Mobility  Bed Mobility Overal bed mobility: Needs Assistance;+2 for physical assistance;+ 2 for safety/equipment Bed Mobility: Sit to Supine       Sit to supine: Mod assist      Transfers Overall transfer level: Needs assistance Equipment used: Rolling walker (2 wheeled) Transfers: Sit to/from Stand Sit to Stand: Max assist;+2 physical assistance (Assist/cues for PWB R)         General transfer comment: Achieves partial stand  Ambulation/Gait Ambulation/Gait assistance:  (Unable at this time)               [employer] Rankin (Stroke Patients Only)       Balance Overall balance assessment: Needs assistance         Standing balance support: No upper extremity supported Standing balance-Leahy Scale: Zero                      Cognition Arousal/Alertness: Awake/alert Behavior During Therapy: WFL for tasks assessed/performed Overall Cognitive Status: History of cognitive impairments - at baseline                      Exercises General Exercises - Lower Extremity Ankle Circles/Pumps:  AROM;Both;20 reps;Supine Quad Sets: Strengthening;Both;20 reps;Supine Gluteal Sets: Strengthening;20 reps;Both;Supine Short Arc Quad: AAROM;Both;15 reps;Supine Long Arc Quad: AAROM;Both;10 reps;Seated Heel Slides: AAROM;Both;15 reps;Supine Hip ABduction/ADduction: AAROM;Both;15 reps;Supine Straight Leg Raises: AAROM;Both;15 reps;Supine Hip Flexion/Marching: AAROM;20 reps;Seated;Right (L AROM)    General Comments        Pertinent Vitals/Pain Pain Score:  (Medium pain) Pain Location: R hip Pain Intervention(s): Monitored during session    Home Living Family/patient expects to be discharged to:: Skilled nursing facility                    Prior Function Level of Independence: Needs assistance    ADL's / Homemaking Assistance Needed: Patient in a memory care unit as per daughter when she fell     PT Goals (current goals can now be found in the care plan section) Progress towards PT goals: Progressing toward goals    Frequency  BID    PT Plan Current plan remains appropriate    Co-evaluation             End of Session Equipment Utilized During Treatment: Gait belt Activity Tolerance: Patient tolerated treatment well Patient left: in bed;with call bell/phone within reach;with bed alarm set;with family/visitor present     Time: 2549-8264 PT Time Calculation (min) (ACUTE ONLY): 35 min  Charges:  $Therapeutic Exercise: 8-22  mins $Therapeutic Activity: 8-22 mins                    G Codes:      Charlaine Dalton 01/11/2015, 2:16 PM

## 2015-01-11 NOTE — Care Management Important Message (Signed)
Important Message  Patient Details  Name: Christina Hahn MRN: [id number] Date of Birth: 08-Oct-1922   Medicare Important Message Given:  Yes-second notification given    Darius Bump Allmond 01/11/2015, 12:33 PM

## 2015-01-12 DIAGNOSIS — S72141D Displaced intertrochanteric fracture of right femur, subsequent encounter for closed fracture with routine healing: Secondary | ICD-10-CM | POA: Diagnosis not present

## 2015-01-12 DIAGNOSIS — R262 Difficulty in walking, not elsewhere classified: Secondary | ICD-10-CM | POA: Diagnosis not present

## 2015-01-12 DIAGNOSIS — E46 Unspecified protein-calorie malnutrition: Secondary | ICD-10-CM | POA: Diagnosis not present

## 2015-01-12 DIAGNOSIS — F0390 Unspecified dementia without behavioral disturbance: Secondary | ICD-10-CM | POA: Diagnosis not present

## 2015-01-12 DIAGNOSIS — R41841 Cognitive communication deficit: Secondary | ICD-10-CM | POA: Diagnosis not present

## 2015-01-12 DIAGNOSIS — R1312 Dysphagia, oropharyngeal phase: Secondary | ICD-10-CM | POA: Diagnosis not present

## 2015-01-12 DIAGNOSIS — F329 Major depressive disorder, single episode, unspecified: Secondary | ICD-10-CM | POA: Diagnosis not present

## 2015-01-12 DIAGNOSIS — N179 Acute kidney failure, unspecified: Secondary | ICD-10-CM | POA: Diagnosis not present

## 2015-01-12 DIAGNOSIS — K219 Gastro-esophageal reflux disease without esophagitis: Secondary | ICD-10-CM | POA: Diagnosis not present

## 2015-01-12 DIAGNOSIS — D638 Anemia in other chronic diseases classified elsewhere: Secondary | ICD-10-CM | POA: Diagnosis not present

## 2015-01-12 DIAGNOSIS — S72001A Fracture of unspecified part of neck of right femur, initial encounter for closed fracture: Secondary | ICD-10-CM | POA: Diagnosis not present

## 2015-01-12 DIAGNOSIS — E038 Other specified hypothyroidism: Secondary | ICD-10-CM | POA: Diagnosis not present

## 2015-01-12 DIAGNOSIS — I6350 Cerebral infarction due to unspecified occlusion or stenosis of unspecified cerebral artery: Secondary | ICD-10-CM | POA: Diagnosis not present

## 2015-01-12 DIAGNOSIS — M19131 Post-traumatic osteoarthritis, right wrist: Secondary | ICD-10-CM | POA: Diagnosis not present

## 2015-01-12 DIAGNOSIS — N390 Urinary tract infection, site not specified: Secondary | ICD-10-CM | POA: Diagnosis not present

## 2015-01-12 DIAGNOSIS — D649 Anemia, unspecified: Secondary | ICD-10-CM | POA: Diagnosis not present

## 2015-01-12 DIAGNOSIS — M6281 Muscle weakness (generalized): Secondary | ICD-10-CM | POA: Diagnosis not present

## 2015-01-12 DIAGNOSIS — F419 Anxiety disorder, unspecified: Secondary | ICD-10-CM | POA: Diagnosis not present

## 2015-01-12 DIAGNOSIS — I2510 Atherosclerotic heart disease of native coronary artery without angina pectoris: Secondary | ICD-10-CM | POA: Diagnosis not present

## 2015-01-12 DIAGNOSIS — N3281 Overactive bladder: Secondary | ICD-10-CM | POA: Diagnosis not present

## 2015-01-12 DIAGNOSIS — M25559 Pain in unspecified hip: Secondary | ICD-10-CM | POA: Diagnosis not present

## 2015-01-12 DIAGNOSIS — Z5189 Encounter for other specified aftercare: Secondary | ICD-10-CM | POA: Diagnosis not present

## 2015-01-12 DIAGNOSIS — E039 Hypothyroidism, unspecified: Secondary | ICD-10-CM | POA: Diagnosis not present

## 2015-01-12 DIAGNOSIS — N189 Chronic kidney disease, unspecified: Secondary | ICD-10-CM | POA: Diagnosis not present

## 2015-01-12 DIAGNOSIS — J45901 Unspecified asthma with (acute) exacerbation: Secondary | ICD-10-CM | POA: Diagnosis not present

## 2015-01-12 DIAGNOSIS — B309 Viral conjunctivitis, unspecified: Secondary | ICD-10-CM | POA: Diagnosis not present

## 2015-01-12 DIAGNOSIS — J189 Pneumonia, unspecified organism: Secondary | ICD-10-CM | POA: Diagnosis not present

## 2015-01-12 DIAGNOSIS — I10 Essential (primary) hypertension: Secondary | ICD-10-CM | POA: Diagnosis not present

## 2015-01-12 LAB — CBC
HCT: 24.7 % — ABNORMAL LOW (ref 35.0–47.0)
Hemoglobin: 8.2 g/dL — ABNORMAL LOW (ref 12.0–16.0)
MCH: 30.2 pg (ref 26.0–34.0)
MCHC: 33.2 g/dL (ref 32.0–36.0)
MCV: 90.9 fL (ref 80.0–100.0)
PLATELETS: 116 10*3/uL — AB (ref 150–440)
RBC: 2.72 MIL/uL — AB (ref 3.80–5.20)
RDW: 16.6 % — AB (ref 11.5–14.5)
WBC: 7.5 10*3/uL (ref 3.6–11.0)

## 2015-01-12 MED ORDER — ENOXAPARIN SODIUM 30 MG/0.3ML ~~LOC~~ SOLN: 30.0000 mg | SUBCUTANEOUS | Status: DC

## 2015-01-12 MED ORDER — FERROUS SULFATE 325 (65 FE) MG PO TABS: 325.0000 mg | ORAL_TABLET | Freq: Every day | ORAL | Status: DC

## 2015-01-12 NOTE — Progress Notes (Signed)
Physical Therapy Treatment Patient Details Name: Christina Hahn MRN: [id number] DOB: November 20, 1922 Today's Date: 01/12/2015    History of Present Illness 81yo female with onset of femoral fracture with IM nailing, has PWB and Buck's traction on in bed.  PMHx:  CKD, stroke, dementia, sepsis with rehab admission when pt fell    PT Comments    Pt agreeable to bed exercises. Pt requires some increased instruction and time to perform, but performs well today without pain complaints and notes several times "it's looser". At this time, pt has discharge orders to skilled nursing facility.   Follow Up Recommendations  SNF     Equipment Recommendations  None recommended by PT    Recommendations for Other Services       Precautions / Restrictions Precautions Precautions: Fall Restrictions Weight Bearing Restrictions: Yes RLE Weight Bearing: Partial weight bearing    Mobility  Bed Mobility                  Transfers                    Ambulation/Gait                 Stairs            Wheelchair Mobility    Modified Rankin (Stroke Patients Only)       Balance                                    Cognition Arousal/Alertness: Awake/alert Behavior During Therapy: WFL for tasks assessed/performed Overall Cognitive Status: History of cognitive impairments - at baseline                      Exercises General Exercises - Lower Extremity Ankle Circles/Pumps: AROM;Both;20 reps;Supine Quad Sets: Strengthening;Both;20 reps;Supine Gluteal Sets: Strengthening;20 reps;Both;Supine Short Arc Quad: AAROM;Both;Supine;20 reps Heel Slides: AAROM;Both;Supine;20 reps Hip ABduction/ADduction: AAROM;Both;Supine;20 reps Straight Leg Raises: AAROM;Both;Supine;20 reps    General Comments        Pertinent Vitals/Pain Pain Assessment: No/denies pain (does not react as if painful during exercises)    Home Living                       Prior Function            PT Goals (current goals can now be found in the care plan section) Progress towards PT goals: Progressing toward goals (slowly)    Frequency  BID    PT Plan Current plan remains appropriate    Co-evaluation             End of Session   Activity Tolerance: Patient tolerated treatment well (Short rest breaks between exercises) Patient left: in bed;with call bell/phone within reach;with bed alarm set;with family/visitor present     Time: 0940-1007 PT Time Calculation (min) (ACUTE ONLY): 27 min  Charges:  $Therapeutic Exercise: 23-37 mins                    G Codes:      Charlaine Dalton 01/12/2015, 10:23 AM

## 2015-01-12 NOTE — Discharge Summary (Signed)
Christina Hahn, is a 81 y.o. female  DOB 24-Jan-1923  MRN [id number].  Admission date:  01/08/2015  Admitting Physician  Demetrios Loll, MD  Discharge Date:  01/12/2015   Primary MD  [name], MD  Recommendations for primary care physician for things to follow:  Follow-up with Dr. Earnestine Leys in about a week.   Admission Diagnosis  Hip fracture, right, closed, initial encounter [S72.001A]   Discharge Diagnosis  Hip fracture, right, closed, initial encounter [S72.001A]    Principal Problem:   Closed right hip fracture      Past Medical History  Diagnosis Date  . Arthritis   . Hypertension   . GERD (gastroesophageal reflux disease)   . Depression   . Eczema   . Asthma   . Anxiety   . Dementia   . Thyroid disease   . Stroke   . Chronic kidney disease     Past Surgical History  Procedure Laterality Date  . Cholecystectomy    . Tonsillectomy    . Abdominal hysterectomy    . Breast lumpectomy    . Laminectomy    . Cataract extraction    . Intramedullary (im) nail intertrochanteric Right 01/09/2015    Procedure: INTRAMEDULLARY (IM) NAIL INTERTROCHANTRIC;  Surgeon: Earnestine Leys, MD;  Location: [facility] ORS;  Service: Orthopedics;  Laterality: Right;       History of present illness and  Hospital Course:     Kindly see H&P for history of present illness and admission details, please review complete Labs, Consult reports and Test reports for all details in brief  HPI  from the history and physical done on the day of admission 81 year old female patient with history of hypertension, CK D, CVA came in now because of the fall and suffered a right hip fracture. Patient is admitted to medicine for right hip fracture.   Hospital Course  #1 right hip fracture: Patient x-ray showed the Comminuted displaced  intertrochanteric fracture right hip; patient is seen by Dr. Earnestine Leys had open reduction and internal fixation of the right hip on July 23. Postoperatively patient tolerated procedure well and had blood loss anemia so she received 1 unit of transfusion. She is afebrile,  stable [facility] with Lovenox for DVT prophylaxis, pain medication as needed. And the patient needs to see Dr. Earnestine Leys in about a week to 10 days. 2 hypertension ;controlled initially she was hypotensive , d/c enalapril at  discharge. 3.Dementia; continue Aricept and Celexa #4 acute renal failure; resolved  Discharge Condition: stable   Follow UP  Follow-up Information    Follow up with Christie Nottingham., PA On 01/19/2015.   Specialty:  Physician Assistant   Why:  [place] is at [employer] information:   [place] [address] [phone]       Follow up with Park Breed, MD On 01/26/2015.   Specialty:  Specialist   Why:  Appointment is at 10:15   Contact information:   [place] [address] [phone]         Discharge Instructions  and  Discharge Medications        Medication List    STOP taking these medications        ciprofloxacin 500 MG tablet  Commonly known as:  CIPRO     enalapril 5 MG tablet  Commonly known as:  VASOTEC     sulfamethoxazole-trimethoprim 800-160 MG per tablet  Commonly known as:  BACTRIM DS,SEPTRA DS     traMADol 50 MG tablet  Commonly known as:  ULTRAM      TAKE these medications        acetaminophen 325 MG tablet  Commonly known as:  TYLENOL  Take 650 mg by mouth every 4 (four) hours as needed for mild pain.     albuterol 108 (90 BASE) MCG/ACT inhaler  Commonly known as:  PROVENTIL HFA;VENTOLIN HFA  Inhale 2 puffs into the lungs every 8 (eight) hours as needed for shortness of breath.     albuterol (2.5 MG/3ML) 0.083% nebulizer solution  Commonly known as:  PROVENTIL  Take 2.5 mg  by nebulization every 6 (six) hours as needed for shortness of breath.     aspirin 81 MG chewable tablet  Chew 81 mg by mouth daily.     Calcium Carbonate-Vitamin D 600-400 MG-UNIT per tablet  Take 1 tablet by mouth daily.     citalopram 20 MG tablet  Commonly known as:  CELEXA  Take 20 mg by mouth 2 (two) times daily.     docusate sodium 100 MG capsule  Commonly known as:  COLACE  Take 1 capsule (100 mg total) by mouth 2 (two) times daily.     donepezil 5 MG tablet  Commonly known as:  ARICEPT  Take 5 mg by mouth daily.     enoxaparin 30 MG/0.3ML injection  Commonly known as:  LOVENOX  Inject 0.3 mLs (30 mg total) into the skin daily.     erythromycin ophthalmic ointment  Place 1 application into the left eye 4 (four) times daily.     ferrous sulfate 325 (65 FE) MG tablet  Take 1 tablet (325 mg total) by mouth daily with breakfast.     Fluticasone-Salmeterol 250-50 MCG/DOSE Aepb  Commonly known as:  ADVAIR  Inhale 1 puff into the lungs 2 (two) times daily.     lactose free nutrition Liqd  Take 237 mLs by mouth 2 (two) times daily as needed (when pt is not eating well).     feeding supplement (ENSURE ENLIVE) Liqd  Take 237 mLs by mouth 3 (three) times daily between meals.     levothyroxine 75 MCG tablet  Commonly known as:  SYNTHROID, LEVOTHROID  Take 75 mcg by mouth daily.     loratadine 10 MG tablet  Commonly known as:  CLARITIN  Take 10 mg by mouth daily.     montelukast 10 MG tablet  Commonly known as:  SINGULAIR  Take 10 mg by mouth daily.     OLANZapine 2.5 MG tablet  Commonly known as:  ZYPREXA  Take 1 tablet (2.5 mg total) by mouth at bedtime.     omeprazole 20 MG capsule  Commonly known as:  PRILOSEC  Take 20 mg by mouth 2 (two) times daily.     SYSTANE OP  Apply 1 drop to eye 2 (two) times daily as needed (for dry eyes).     THEREMS Tabs  Take 1 tablet by mouth 2 (two) times daily.     trospium 20 MG tablet  Commonly known as:  SANCTURA   Take 20 mg by mouth daily.          Diet and Activity recommendation: See Discharge Instructions above   Consults obtained - ortho   Major procedures and Radiology Reports - PLEASE review detailed and final reports for all details, in brief -      Dg Chest 1 View  01/08/2015   CLINICAL DATA:  Preop for hip fracture.  EXAM: CHEST  1 VIEW  COMPARISON:  January 04, 2015.  FINDINGS: Stable cardiomediastinal silhouette. No pneumothorax or pleural effusion is noted. Stable hiatal hernia is noted. No acute pulmonary disease is noted. Bony thorax is intact.  IMPRESSION: Stable hiatal hernia.  No acute cardiopulmonary abnormality seen.   Electronically Signed   By: Marijo Conception, M.D.   On: 01/08/2015 15:47   Dg Chest 2 View  01/04/2015   CLINICAL DATA:  One-week history of pneumonia, weakness, and unsteadiness. history of asthma-COPD, CVA, and previous cardiac surgery.  EXAM: CHEST  2 VIEW  COMPARISON:  PA and lateral chest x-ray of November 26, 2014.  FINDINGS: The lungs are hyperinflated. There is a moderate-sized hiatal hernia containing fluid and air. There is no alveolar infiltrate. There is no significant pleural effusion. The heart is normal in size. The pulmonary vascularity is not engorged. There is mild dextrocurvature centered in the mid thoracic spine.  IMPRESSION: COPD. Interval clearing of the infiltrate or atelectasis at the left lung base. There remains increased density here likely secondary to the large hiatal hernia.   Electronically Signed   By: David  Martinique M.D.   On: 01/04/2015 14:03   Dg Hip Operative Unilat With Pelvis Right  01/09/2015   CLINICAL DATA:  Right hip fracture. Intraoperative images of fracture fixation. Initial encounter.  EXAM: OPERATIVE RIGHT HIP (WITH PELVIS IF PERFORMED) 10 VIEWS  TECHNIQUE: Fluoroscopic spot image(s) were submitted for interpretation post-operatively.  COMPARISON:  Plain films of the right hip 01/08/2015.  FINDINGS: Provided images demonstrate  placement of a short intra medullary nail and hip screw for fixation of an intertrochanteric fracture. Hardware appears intact. No acute abnormality is identified. Position and alignment of the patient's fracture are markedly improved.  IMPRESSION: ORIF right hip fracture.  No acute finding.   Electronically Signed   By: [name] M.D.   On: 01/09/2015 09:55   Dg Hip Unilat With Pelvis 2-3 Views Right  01/08/2015   CLINICAL DATA:  Pain following fall  EXAM: DG HIP (WITH OR WITHOUT PELVIS) 2-3V RIGHT  COMPARISON:  None.  FINDINGS: Frontal pelvis as well as frontal and lateral right hip images were obtained. There is a comminuted intertrochanteric femur fracture on the right with varus angulation at the fracture site. No other fractures are identified. No dislocation. Bones are osteoporotic. There is mild narrowing of both hip joints.  IMPRESSION: Comminuted intertrochanteric femur fracture on the right with varus angulation of the fracture site. No dislocation. Narrowing both hip joints. Bones osteoporotic.   Electronically Signed   By: Lowella Grip III M.D.   On: 01/08/2015 15:46    Micro Results     No results found for this or any previous visit (from the past 240 hour(s)).     Today   Subjective:   Shanisha Lech be discharged to [facility] today she is afebrile, hemodynamically stable. Objective:   Blood pressure 99/46, pulse 75, temperature 97.5 F (36.4 C), temperature source Oral, resp. rate 18, height 5\' 5"  (1.651 m), weight 71.668 kg (158 lb), SpO2 98 %.   Intake/Output Summary (Last 24 hours) at 01/12/15 1037 Last data filed at 01/12/15 0933  Gross per 24 hour  Intake    360 ml  Output      0 ml  Net    360 ml    Exam Awake Alert, Oriented x 3, No new F.N deficits, Normal affect  [place].AT,PERRAL Supple Neck,No JVD, No cervical lymphadenopathy appriciated.  Symmetrical Chest wall movement, Good air movement bilaterally, CTAB RRR,No Gallops,Rubs or new Murmurs,  No Parasternal Heave +ve B.Sounds, Abd Soft, Non tender, No organomegaly appriciated, No rebound -guarding or rigidity. No Cyanosis, Clubbing or edema, No new Rash or bruise  Data Review   CBC w Diff: Lab Results  Component Value Date   WBC 7.5 01/12/2015   WBC 6.6 10/20/2013   HGB 8.2* 01/12/2015   HGB 11.5* 10/20/2013   HCT 24.7* 01/12/2015   HCT 34.5* 10/20/2013   PLT 116* 01/12/2015   PLT 347 10/20/2013   LYMPHOPCT 32 01/08/2015   BANDSPCT 9 11/23/2014   MONOPCT 7 01/08/2015   EOSPCT 2 01/08/2015   BASOPCT 1 01/08/2015    CMP: Lab Results  Component Value Date   NA 137 01/11/2015   NA 138 10/20/2013   K 4.0 01/11/2015   K 4.3 10/20/2013   CL 106 01/11/2015   CL 104 10/20/2013   CO2 24 01/11/2015   CO2 29 10/20/2013   BUN 17 01/11/2015   BUN 21* 10/20/2013   CREATININE 1.37* 01/11/2015   CREATININE 1.28 10/20/2013   PROT 5.1* 11/24/2014   PROT 7.0 10/20/2013   ALBUMIN 3.7 01/08/2015   ALBUMIN 3.1* 10/20/2013   BILITOT 0.6 11/24/2014   BILITOT 0.4 10/20/2013   ALKPHOS 53 11/24/2014   ALKPHOS 151* 10/20/2013   AST 18 11/24/2014   AST 27 10/20/2013   ALT 16 11/24/2014   ALT 42 10/20/2013  .   Total Time in preparing paper work, data evaluation and todays exam - 46 minutes  Claud Gowan M.D on 01/12/2015 at 10:37 AM

## 2015-01-12 NOTE — Progress Notes (Signed)
Left via EMS

## 2015-01-12 NOTE — Progress Notes (Signed)
Pt. D/c'd to [facility]. Report called to [facility]. EMS notified of transport.

## 2015-01-12 NOTE — Progress Notes (Signed)
Patient is medically stable for D/C to [facility] today. Per Christina Hahn liaison patient is going to a private room 117. RN will call report to Christina Patten RN at [phone] and arrange EMS for transport. Clinical Education officer, museum (CSW) prepared D/C packet and sent D/C Summary and follow up appointments to [facility] via [facility]. Patient's daughter is at bedside and aware of above. Please reconsult if future social work needs arise. CSW signing off.   Christina Hahn, [place] [phone]

## 2015-01-12 NOTE — Clinical Social Work Placement (Signed)
   CLINICAL SOCIAL WORK PLACEMENT  NOTE  Date:  01/12/2015  Patient Details  Name: Christina Hahn MRN: [id number] Date of Birth: [birth year]  Clinical Social Work is seeking post-discharge placement for this patient at the [place] level of care (*CSW will initial, date and re-position this form in  chart as items are completed):  Yes   Patient/family provided with [facility] Work Department's list of facilities offering this level of care within the geographic area requested by the patient (or if unable, by the patient's family).  Yes   Patient/family informed of their freedom to choose among providers that offer the needed level of care, that participate in Medicare, Medicaid or managed care program needed by the patient, have an available bed and are willing to accept the patient.  Yes   Patient/family informed of [facility]'s ownership interest in [facility] and [facility], as well as of the fact that they are under no obligation to receive care at these facilities.  PASRR submitted to EDS on 01/11/15     PASRR number received on 01/11/15     Existing PASRR number confirmed on       FL2 transmitted to all facilities in geographic area requested by pt/family on 01/10/15     FL2 transmitted to all facilities within larger geographic area on       Patient informed that his/her managed care company has contracts with or will negotiate with certain facilities, including the following:        Yes   Patient/family informed of bed offers received.  Patient chooses bed at  ([facility] )     Physician recommends and patient chooses bed at      Patient to be transferred to  ([facility]) on 01/12/15.  Patient to be transferred to facility by  [facility] )     Patient family notified on 01/12/15 of transfer.  Name of family member notified:   (Daughter was at bedside. )     PHYSICIAN       Additional Comment:     _______________________________________________ Loralyn Freshwater, LCSW 01/12/2015, 11:34 AM

## 2015-01-12 NOTE — Progress Notes (Signed)
Subjective: 3 Days Post-Op Procedure(s) (LRB): INTRAMEDULLARY (IM) NAIL INTERTROCHANTRIC (Right)     Patient reports pain as mild.Alert and ready for SNF transfer.  Will RTC 7-10 days.    Objective:   VITALS:   Filed Vitals:   01/12/15 0815  BP: 99/46  Pulse: 75  Temp: 97.5 F (36.4 C)  Resp: 18    Neurologically intact Sensation intact distally Intact pulses distally Dorsiflexion/Plantar flexion intact Incision: no drainage No cellulitis present  LABS  Recent Labs  01/10/15 0439 01/11/15 0431 01/12/15 0521  HGB 9.0* 8.2* 8.2*  HCT 26.7* 24.3* 24.7*  WBC 7.7 8.0 7.5  PLT 90* 90* 116*     Recent Labs  01/10/15 0439 01/11/15 0431  NA 135 137  K 4.0 4.0  BUN 16 17  CREATININE 1.58* 1.37*  GLUCOSE 113* 106*    No results for input(s): LABPT, INR in the last 72 hours.   Assessment/Plan: 3 Days Post-Op Procedure(s) (LRB): INTRAMEDULLARY (IM) NAIL INTERTROCHANTRIC (Right)   Discharge to SNF  [facility] with walker RTC 7-10 days

## 2015-01-17 DIAGNOSIS — F419 Anxiety disorder, unspecified: Secondary | ICD-10-CM | POA: Diagnosis not present

## 2015-01-17 DIAGNOSIS — F0390 Unspecified dementia without behavioral disturbance: Secondary | ICD-10-CM | POA: Diagnosis not present

## 2015-01-17 DIAGNOSIS — I6350 Cerebral infarction due to unspecified occlusion or stenosis of unspecified cerebral artery: Secondary | ICD-10-CM | POA: Diagnosis not present

## 2015-01-17 DIAGNOSIS — F329 Major depressive disorder, single episode, unspecified: Secondary | ICD-10-CM | POA: Diagnosis not present

## 2015-01-23 DIAGNOSIS — M25559 Pain in unspecified hip: Secondary | ICD-10-CM | POA: Diagnosis not present

## 2015-01-23 DIAGNOSIS — I10 Essential (primary) hypertension: Secondary | ICD-10-CM | POA: Diagnosis not present

## 2015-01-23 DIAGNOSIS — D649 Anemia, unspecified: Secondary | ICD-10-CM | POA: Diagnosis not present

## 2015-01-23 DIAGNOSIS — J45901 Unspecified asthma with (acute) exacerbation: Secondary | ICD-10-CM | POA: Diagnosis not present

## 2015-01-24 DIAGNOSIS — N390 Urinary tract infection, site not specified: Secondary | ICD-10-CM | POA: Diagnosis not present

## 2015-01-24 DIAGNOSIS — E039 Hypothyroidism, unspecified: Secondary | ICD-10-CM | POA: Diagnosis not present

## 2015-01-24 DIAGNOSIS — E46 Unspecified protein-calorie malnutrition: Secondary | ICD-10-CM | POA: Diagnosis not present

## 2015-01-24 DIAGNOSIS — D649 Anemia, unspecified: Secondary | ICD-10-CM | POA: Diagnosis not present

## 2015-01-24 DIAGNOSIS — J189 Pneumonia, unspecified organism: Secondary | ICD-10-CM | POA: Diagnosis not present

## 2015-01-26 DIAGNOSIS — S72141D Displaced intertrochanteric fracture of right femur, subsequent encounter for closed fracture with routine healing: Secondary | ICD-10-CM | POA: Diagnosis not present

## 2015-01-26 DIAGNOSIS — M19131 Post-traumatic osteoarthritis, right wrist: Secondary | ICD-10-CM | POA: Diagnosis not present

## 2015-01-31 DIAGNOSIS — N189 Chronic kidney disease, unspecified: Secondary | ICD-10-CM | POA: Diagnosis not present

## 2015-01-31 DIAGNOSIS — D649 Anemia, unspecified: Secondary | ICD-10-CM | POA: Diagnosis not present

## 2015-01-31 DIAGNOSIS — K219 Gastro-esophageal reflux disease without esophagitis: Secondary | ICD-10-CM | POA: Diagnosis not present

## 2015-01-31 DIAGNOSIS — F419 Anxiety disorder, unspecified: Secondary | ICD-10-CM | POA: Diagnosis not present

## 2015-02-13 DIAGNOSIS — M1990 Unspecified osteoarthritis, unspecified site: Secondary | ICD-10-CM | POA: Diagnosis not present

## 2015-02-13 DIAGNOSIS — I10 Essential (primary) hypertension: Secondary | ICD-10-CM | POA: Diagnosis not present

## 2015-02-13 DIAGNOSIS — F329 Major depressive disorder, single episode, unspecified: Secondary | ICD-10-CM | POA: Diagnosis not present

## 2015-02-13 DIAGNOSIS — F0391 Unspecified dementia with behavioral disturbance: Secondary | ICD-10-CM | POA: Diagnosis not present

## 2015-02-13 DIAGNOSIS — J45909 Unspecified asthma, uncomplicated: Secondary | ICD-10-CM | POA: Diagnosis not present

## 2015-02-13 DIAGNOSIS — F419 Anxiety disorder, unspecified: Secondary | ICD-10-CM | POA: Diagnosis not present

## 2015-02-16 DIAGNOSIS — F329 Major depressive disorder, single episode, unspecified: Secondary | ICD-10-CM | POA: Diagnosis not present

## 2015-02-16 DIAGNOSIS — I10 Essential (primary) hypertension: Secondary | ICD-10-CM | POA: Diagnosis not present

## 2015-02-16 DIAGNOSIS — F0391 Unspecified dementia with behavioral disturbance: Secondary | ICD-10-CM | POA: Diagnosis not present

## 2015-02-16 DIAGNOSIS — M1990 Unspecified osteoarthritis, unspecified site: Secondary | ICD-10-CM | POA: Diagnosis not present

## 2015-02-16 DIAGNOSIS — F419 Anxiety disorder, unspecified: Secondary | ICD-10-CM | POA: Diagnosis not present

## 2015-02-16 DIAGNOSIS — J45909 Unspecified asthma, uncomplicated: Secondary | ICD-10-CM | POA: Diagnosis not present

## 2015-02-18 DIAGNOSIS — G309 Alzheimer's disease, unspecified: Secondary | ICD-10-CM | POA: Diagnosis not present

## 2015-02-18 DIAGNOSIS — F329 Major depressive disorder, single episode, unspecified: Secondary | ICD-10-CM | POA: Diagnosis not present

## 2015-02-18 DIAGNOSIS — M1990 Unspecified osteoarthritis, unspecified site: Secondary | ICD-10-CM | POA: Diagnosis not present

## 2015-02-18 DIAGNOSIS — Z87440 Personal history of urinary (tract) infections: Secondary | ICD-10-CM | POA: Diagnosis not present

## 2015-02-18 DIAGNOSIS — F0280 Dementia in other diseases classified elsewhere without behavioral disturbance: Secondary | ICD-10-CM | POA: Diagnosis not present

## 2015-02-18 DIAGNOSIS — Z7982 Long term (current) use of aspirin: Secondary | ICD-10-CM | POA: Diagnosis not present

## 2015-02-18 DIAGNOSIS — R262 Difficulty in walking, not elsewhere classified: Secondary | ICD-10-CM | POA: Diagnosis not present

## 2015-02-18 DIAGNOSIS — N390 Urinary tract infection, site not specified: Secondary | ICD-10-CM | POA: Diagnosis not present

## 2015-02-18 DIAGNOSIS — S72001D Fracture of unspecified part of neck of right femur, subsequent encounter for closed fracture with routine healing: Secondary | ICD-10-CM | POA: Diagnosis not present

## 2015-02-18 DIAGNOSIS — J45909 Unspecified asthma, uncomplicated: Secondary | ICD-10-CM | POA: Diagnosis not present

## 2015-02-18 DIAGNOSIS — F419 Anxiety disorder, unspecified: Secondary | ICD-10-CM | POA: Diagnosis not present

## 2015-02-18 DIAGNOSIS — N184 Chronic kidney disease, stage 4 (severe): Secondary | ICD-10-CM | POA: Diagnosis not present

## 2015-02-18 DIAGNOSIS — I129 Hypertensive chronic kidney disease with stage 1 through stage 4 chronic kidney disease, or unspecified chronic kidney disease: Secondary | ICD-10-CM | POA: Diagnosis not present

## 2015-02-18 DIAGNOSIS — Z9981 Dependence on supplemental oxygen: Secondary | ICD-10-CM | POA: Diagnosis not present

## 2015-02-18 DIAGNOSIS — Z8701 Personal history of pneumonia (recurrent): Secondary | ICD-10-CM | POA: Diagnosis not present

## 2015-02-27 DIAGNOSIS — I129 Hypertensive chronic kidney disease with stage 1 through stage 4 chronic kidney disease, or unspecified chronic kidney disease: Secondary | ICD-10-CM | POA: Diagnosis not present

## 2015-02-27 DIAGNOSIS — J45909 Unspecified asthma, uncomplicated: Secondary | ICD-10-CM | POA: Diagnosis not present

## 2015-02-27 DIAGNOSIS — S72001D Fracture of unspecified part of neck of right femur, subsequent encounter for closed fracture with routine healing: Secondary | ICD-10-CM | POA: Diagnosis not present

## 2015-02-27 DIAGNOSIS — G309 Alzheimer's disease, unspecified: Secondary | ICD-10-CM | POA: Diagnosis not present

## 2015-02-27 DIAGNOSIS — M1990 Unspecified osteoarthritis, unspecified site: Secondary | ICD-10-CM | POA: Diagnosis not present

## 2015-02-27 DIAGNOSIS — N184 Chronic kidney disease, stage 4 (severe): Secondary | ICD-10-CM | POA: Diagnosis not present

## 2015-03-02 DIAGNOSIS — F0281 Dementia in other diseases classified elsewhere with behavioral disturbance: Secondary | ICD-10-CM | POA: Diagnosis not present

## 2015-03-02 DIAGNOSIS — N390 Urinary tract infection, site not specified: Secondary | ICD-10-CM | POA: Diagnosis not present

## 2015-03-02 DIAGNOSIS — S72141D Displaced intertrochanteric fracture of right femur, subsequent encounter for closed fracture with routine healing: Secondary | ICD-10-CM | POA: Diagnosis not present

## 2015-03-02 DIAGNOSIS — I129 Hypertensive chronic kidney disease with stage 1 through stage 4 chronic kidney disease, or unspecified chronic kidney disease: Secondary | ICD-10-CM | POA: Diagnosis not present

## 2015-03-04 DIAGNOSIS — J45909 Unspecified asthma, uncomplicated: Secondary | ICD-10-CM | POA: Diagnosis not present

## 2015-03-04 DIAGNOSIS — N184 Chronic kidney disease, stage 4 (severe): Secondary | ICD-10-CM | POA: Diagnosis not present

## 2015-03-04 DIAGNOSIS — M1990 Unspecified osteoarthritis, unspecified site: Secondary | ICD-10-CM | POA: Diagnosis not present

## 2015-03-04 DIAGNOSIS — S72001D Fracture of unspecified part of neck of right femur, subsequent encounter for closed fracture with routine healing: Secondary | ICD-10-CM | POA: Diagnosis not present

## 2015-03-04 DIAGNOSIS — I129 Hypertensive chronic kidney disease with stage 1 through stage 4 chronic kidney disease, or unspecified chronic kidney disease: Secondary | ICD-10-CM | POA: Diagnosis not present

## 2015-03-04 DIAGNOSIS — G309 Alzheimer's disease, unspecified: Secondary | ICD-10-CM | POA: Diagnosis not present

## 2015-03-05 DIAGNOSIS — G309 Alzheimer's disease, unspecified: Secondary | ICD-10-CM | POA: Diagnosis not present

## 2015-03-05 DIAGNOSIS — N184 Chronic kidney disease, stage 4 (severe): Secondary | ICD-10-CM | POA: Diagnosis not present

## 2015-03-05 DIAGNOSIS — J45909 Unspecified asthma, uncomplicated: Secondary | ICD-10-CM | POA: Diagnosis not present

## 2015-03-05 DIAGNOSIS — I129 Hypertensive chronic kidney disease with stage 1 through stage 4 chronic kidney disease, or unspecified chronic kidney disease: Secondary | ICD-10-CM | POA: Diagnosis not present

## 2015-03-05 DIAGNOSIS — M1990 Unspecified osteoarthritis, unspecified site: Secondary | ICD-10-CM | POA: Diagnosis not present

## 2015-03-05 DIAGNOSIS — S72001D Fracture of unspecified part of neck of right femur, subsequent encounter for closed fracture with routine healing: Secondary | ICD-10-CM | POA: Diagnosis not present

## 2015-03-11 DIAGNOSIS — J45909 Unspecified asthma, uncomplicated: Secondary | ICD-10-CM | POA: Diagnosis not present

## 2015-03-11 DIAGNOSIS — M1990 Unspecified osteoarthritis, unspecified site: Secondary | ICD-10-CM | POA: Diagnosis not present

## 2015-03-11 DIAGNOSIS — G309 Alzheimer's disease, unspecified: Secondary | ICD-10-CM | POA: Diagnosis not present

## 2015-03-11 DIAGNOSIS — S72001D Fracture of unspecified part of neck of right femur, subsequent encounter for closed fracture with routine healing: Secondary | ICD-10-CM | POA: Diagnosis not present

## 2015-03-11 DIAGNOSIS — I129 Hypertensive chronic kidney disease with stage 1 through stage 4 chronic kidney disease, or unspecified chronic kidney disease: Secondary | ICD-10-CM | POA: Diagnosis not present

## 2015-03-11 DIAGNOSIS — N184 Chronic kidney disease, stage 4 (severe): Secondary | ICD-10-CM | POA: Diagnosis not present

## 2015-03-12 DIAGNOSIS — G309 Alzheimer's disease, unspecified: Secondary | ICD-10-CM | POA: Diagnosis not present

## 2015-03-12 DIAGNOSIS — J45909 Unspecified asthma, uncomplicated: Secondary | ICD-10-CM | POA: Diagnosis not present

## 2015-03-12 DIAGNOSIS — N184 Chronic kidney disease, stage 4 (severe): Secondary | ICD-10-CM | POA: Diagnosis not present

## 2015-03-12 DIAGNOSIS — M1990 Unspecified osteoarthritis, unspecified site: Secondary | ICD-10-CM | POA: Diagnosis not present

## 2015-03-12 DIAGNOSIS — S72001D Fracture of unspecified part of neck of right femur, subsequent encounter for closed fracture with routine healing: Secondary | ICD-10-CM | POA: Diagnosis not present

## 2015-03-12 DIAGNOSIS — I129 Hypertensive chronic kidney disease with stage 1 through stage 4 chronic kidney disease, or unspecified chronic kidney disease: Secondary | ICD-10-CM | POA: Diagnosis not present

## 2015-03-18 ENCOUNTER — Inpatient Hospital Stay
Admission: AD | Admit: 2015-03-18 | Discharge: 2015-03-22 | DRG: 690 | Disposition: A | Discharge status: Skilled Nursing Facility | Payer: Medicare Other | Source: Ambulatory Visit | Attending: Internal Medicine | Admitting: Internal Medicine

## 2015-03-18 DIAGNOSIS — J129 Viral pneumonia, unspecified: Secondary | ICD-10-CM | POA: Diagnosis not present

## 2015-03-18 DIAGNOSIS — Z87440 Personal history of urinary (tract) infections: Secondary | ICD-10-CM | POA: Diagnosis not present

## 2015-03-18 DIAGNOSIS — J301 Allergic rhinitis due to pollen: Secondary | ICD-10-CM | POA: Diagnosis not present

## 2015-03-18 DIAGNOSIS — N390 Urinary tract infection, site not specified: Secondary | ICD-10-CM | POA: Diagnosis present

## 2015-03-18 DIAGNOSIS — Z8249 Family history of ischemic heart disease and other diseases of the circulatory system: Secondary | ICD-10-CM

## 2015-03-18 DIAGNOSIS — J45909 Unspecified asthma, uncomplicated: Secondary | ICD-10-CM | POA: Diagnosis present

## 2015-03-18 DIAGNOSIS — F0390 Unspecified dementia without behavioral disturbance: Secondary | ICD-10-CM | POA: Diagnosis present

## 2015-03-18 DIAGNOSIS — E079 Disorder of thyroid, unspecified: Secondary | ICD-10-CM | POA: Diagnosis present

## 2015-03-18 DIAGNOSIS — B309 Viral conjunctivitis, unspecified: Secondary | ICD-10-CM | POA: Diagnosis not present

## 2015-03-18 DIAGNOSIS — E860 Dehydration: Secondary | ICD-10-CM | POA: Diagnosis present

## 2015-03-18 DIAGNOSIS — I129 Hypertensive chronic kidney disease with stage 1 through stage 4 chronic kidney disease, or unspecified chronic kidney disease: Secondary | ICD-10-CM | POA: Diagnosis present

## 2015-03-18 DIAGNOSIS — Z7982 Long term (current) use of aspirin: Secondary | ICD-10-CM | POA: Diagnosis not present

## 2015-03-18 DIAGNOSIS — F0281 Dementia in other diseases classified elsewhere with behavioral disturbance: Secondary | ICD-10-CM | POA: Diagnosis not present

## 2015-03-18 DIAGNOSIS — I2510 Atherosclerotic heart disease of native coronary artery without angina pectoris: Secondary | ICD-10-CM | POA: Diagnosis not present

## 2015-03-18 DIAGNOSIS — Z79899 Other long term (current) drug therapy: Secondary | ICD-10-CM | POA: Diagnosis not present

## 2015-03-18 DIAGNOSIS — Z801 Family history of malignant neoplasm of trachea, bronchus and lung: Secondary | ICD-10-CM

## 2015-03-18 DIAGNOSIS — F329 Major depressive disorder, single episode, unspecified: Secondary | ICD-10-CM | POA: Diagnosis present

## 2015-03-18 DIAGNOSIS — R1312 Dysphagia, oropharyngeal phase: Secondary | ICD-10-CM | POA: Diagnosis not present

## 2015-03-18 DIAGNOSIS — E039 Hypothyroidism, unspecified: Secondary | ICD-10-CM | POA: Diagnosis present

## 2015-03-18 DIAGNOSIS — Z800 Family history of malignant neoplasm of digestive organs: Secondary | ICD-10-CM | POA: Diagnosis not present

## 2015-03-18 DIAGNOSIS — Z66 Do not resuscitate: Secondary | ICD-10-CM | POA: Diagnosis present

## 2015-03-18 DIAGNOSIS — L309 Dermatitis, unspecified: Secondary | ICD-10-CM | POA: Diagnosis present

## 2015-03-18 DIAGNOSIS — K648 Other hemorrhoids: Secondary | ICD-10-CM | POA: Diagnosis present

## 2015-03-18 DIAGNOSIS — K219 Gastro-esophageal reflux disease without esophagitis: Secondary | ICD-10-CM | POA: Diagnosis present

## 2015-03-18 DIAGNOSIS — K625 Hemorrhage of anus and rectum: Secondary | ICD-10-CM | POA: Diagnosis not present

## 2015-03-18 DIAGNOSIS — Z5189 Encounter for other specified aftercare: Secondary | ICD-10-CM | POA: Diagnosis not present

## 2015-03-18 DIAGNOSIS — B9620 Unspecified Escherichia coli [E. coli] as the cause of diseases classified elsewhere: Secondary | ICD-10-CM | POA: Diagnosis present

## 2015-03-18 DIAGNOSIS — Z8673 Personal history of transient ischemic attack (TIA), and cerebral infarction without residual deficits: Secondary | ICD-10-CM

## 2015-03-18 DIAGNOSIS — N189 Chronic kidney disease, unspecified: Secondary | ICD-10-CM | POA: Diagnosis present

## 2015-03-18 DIAGNOSIS — R41841 Cognitive communication deficit: Secondary | ICD-10-CM | POA: Diagnosis not present

## 2015-03-18 DIAGNOSIS — E038 Other specified hypothyroidism: Secondary | ICD-10-CM | POA: Diagnosis not present

## 2015-03-18 DIAGNOSIS — I10 Essential (primary) hypertension: Secondary | ICD-10-CM | POA: Diagnosis not present

## 2015-03-18 DIAGNOSIS — N3281 Overactive bladder: Secondary | ICD-10-CM | POA: Diagnosis not present

## 2015-03-18 DIAGNOSIS — K921 Melena: Secondary | ICD-10-CM | POA: Diagnosis present

## 2015-03-18 DIAGNOSIS — R4182 Altered mental status, unspecified: Secondary | ICD-10-CM | POA: Diagnosis not present

## 2015-03-18 DIAGNOSIS — M6281 Muscle weakness (generalized): Secondary | ICD-10-CM | POA: Diagnosis not present

## 2015-03-18 LAB — COMPREHENSIVE METABOLIC PANEL
ALK PHOS: 94 U/L (ref 38–126)
ALT: 10 U/L — ABNORMAL LOW (ref 14–54)
AST: 20 U/L (ref 15–41)
Albumin: 3.5 g/dL (ref 3.5–5.0)
Anion gap: 5 (ref 5–15)
BILIRUBIN TOTAL: 0.3 mg/dL (ref 0.3–1.2)
BUN: 15 mg/dL (ref 6–20)
CALCIUM: 8.8 mg/dL — AB (ref 8.9–10.3)
CO2: 31 mmol/L (ref 22–32)
CREATININE: 1.3 mg/dL — AB (ref 0.44–1.00)
Chloride: 101 mmol/L (ref 101–111)
GFR calc Af Amer: 41 mL/min — ABNORMAL LOW (ref >60)
GFR, EST NON AFRICAN AMERICAN: 35 mL/min — AB (ref >60)
Glucose, Bld: 125 mg/dL — ABNORMAL HIGH (ref 65–99)
POTASSIUM: 3.6 mmol/L (ref 3.5–5.1)
Sodium: 137 mmol/L (ref 135–145)
TOTAL PROTEIN: 6.2 g/dL — AB (ref 6.5–8.1)

## 2015-03-18 LAB — CBC WITH DIFFERENTIAL/PLATELET
BASOS ABS: 0.3 10*3/uL — AB (ref 0–0.1)
Basophils Relative: 6 %
EOS ABS: 0.2 10*3/uL (ref 0–0.7)
EOS PCT: 3 %
HCT: 35.7 % (ref 35.0–47.0)
Hemoglobin: 11.7 g/dL — ABNORMAL LOW (ref 12.0–16.0)
LYMPHS PCT: 31 %
Lymphs Abs: 1.7 10*3/uL (ref 1.0–3.6)
MCH: 30.5 pg (ref 26.0–34.0)
MCHC: 32.7 g/dL (ref 32.0–36.0)
MCV: 93.3 fL (ref 80.0–100.0)
Monocytes Absolute: 0.3 10*3/uL (ref 0.2–0.9)
Monocytes Relative: 5 %
Neutro Abs: 3 10*3/uL (ref 1.4–6.5)
Neutrophils Relative %: 55 %
PLATELETS: 188 10*3/uL (ref 150–440)
RBC: 3.83 MIL/uL (ref 3.80–5.20)
RDW: 15.1 % — AB (ref 11.5–14.5)
WBC: 5.5 10*3/uL (ref 3.6–11.0)

## 2015-03-18 LAB — TSH: TSH: 1.41 u[IU]/mL (ref 0.350–4.500)

## 2015-03-18 LAB — URINALYSIS COMPLETE WITH MICROSCOPIC (ARMC ONLY)
Bilirubin Urine: NEGATIVE
Glucose, UA: NEGATIVE mg/dL
Ketones, ur: NEGATIVE mg/dL
NITRITE: NEGATIVE
PROTEIN: NEGATIVE mg/dL
SPECIFIC GRAVITY, URINE: 1.006 (ref 1.005–1.030)
pH: 6 (ref 5.0–8.0)

## 2015-03-18 MED ORDER — MONTELUKAST SODIUM 10 MG PO TABS
10.0000 mg | ORAL_TABLET | Freq: Every day | ORAL | Status: DC
  Administered 2015-03-19 – 2015-03-22 (×4): 10 mg via ORAL
  Filled 2015-03-18 (×4): qty 1

## 2015-03-18 MED ORDER — CALCIUM CARBONATE-VITAMIN D 500-200 MG-UNIT PO TABS
1.0000 | ORAL_TABLET | Freq: Every day | ORAL | Status: DC
  Administered 2015-03-19 – 2015-03-22 (×4): 1 via ORAL
  Filled 2015-03-18 (×4): qty 1

## 2015-03-18 MED ORDER — ACETAMINOPHEN 650 MG RE SUPP: 650.0000 mg | Freq: Four times a day (QID) | RECTAL | Status: DC | PRN

## 2015-03-18 MED ORDER — ERYTHROMYCIN 5 MG/GM OP OINT
1.0000 "application " | TOPICAL_OINTMENT | Freq: Four times a day (QID) | OPHTHALMIC | Status: DC
  Administered 2015-03-18 – 2015-03-22 (×13): 1 via OPHTHALMIC
  Filled 2015-03-18: qty 3.5

## 2015-03-18 MED ORDER — LEVOTHYROXINE SODIUM 75 MCG PO TABS
75.0000 ug | ORAL_TABLET | Freq: Every day | ORAL | Status: DC
  Administered 2015-03-19 – 2015-03-22 (×4): 75 ug via ORAL
  Filled 2015-03-18 (×4): qty 1

## 2015-03-18 MED ORDER — SODIUM CHLORIDE 0.9 % IV SOLN
INTRAVENOUS | Status: DC
  Administered 2015-03-18 – 2015-03-20 (×3): via INTRAVENOUS

## 2015-03-18 MED ORDER — CEFTRIAXONE SODIUM 1 G IJ SOLR
1.0000 g | INTRAMUSCULAR | Status: DC
  Administered 2015-03-18 – 2015-03-19 (×2): 1 g via INTRAVENOUS
  Filled 2015-03-18 (×4): qty 10

## 2015-03-18 MED ORDER — OXYCODONE HCL 5 MG PO TABS
5.0000 mg | ORAL_TABLET | ORAL | Status: DC | PRN
  Administered 2015-03-19 – 2015-03-22 (×4): 5 mg via ORAL
  Filled 2015-03-18 (×4): qty 1

## 2015-03-18 MED ORDER — ADULT MULTIVITAMIN W/MINERALS CH
1.0000 | ORAL_TABLET | Freq: Every day | ORAL | Status: DC
  Administered 2015-03-19 – 2015-03-22 (×4): 1 via ORAL
  Filled 2015-03-18 (×4): qty 1

## 2015-03-18 MED ORDER — DONEPEZIL HCL 5 MG PO TABS
5.0000 mg | ORAL_TABLET | Freq: Every day | ORAL | Status: DC
  Administered 2015-03-19 – 2015-03-22 (×4): 5 mg via ORAL
  Filled 2015-03-18 (×4): qty 1

## 2015-03-18 MED ORDER — POLYETHYLENE GLYCOL 3350 17 G PO PACK
17.0000 g | PACK | Freq: Every day | ORAL | Status: DC
  Administered 2015-03-19 – 2015-03-22 (×3): 17 g via ORAL
  Filled 2015-03-18 (×4): qty 1

## 2015-03-18 MED ORDER — OLANZAPINE 2.5 MG PO TABS
2.5000 mg | ORAL_TABLET | Freq: Every day | ORAL | Status: DC
  Administered 2015-03-18 – 2015-03-21 (×4): 2.5 mg via ORAL
  Filled 2015-03-18 (×5): qty 1

## 2015-03-18 MED ORDER — ONDANSETRON HCL 4 MG/2ML IJ SOLN
4.0000 mg | Freq: Four times a day (QID) | INTRAMUSCULAR | Status: DC | PRN
  Administered 2015-03-20 – 2015-03-21 (×2): 4 mg via INTRAVENOUS
  Filled 2015-03-18 (×2): qty 2

## 2015-03-18 MED ORDER — ALBUTEROL SULFATE (2.5 MG/3ML) 0.083% IN NEBU
2.5000 mg | INHALATION_SOLUTION | Freq: Three times a day (TID) | RESPIRATORY_TRACT | Status: DC | PRN
Start: 2015-03-18 — End: 2015-03-22

## 2015-03-18 MED ORDER — PANTOPRAZOLE SODIUM 40 MG PO TBEC
40.0000 mg | DELAYED_RELEASE_TABLET | Freq: Every day | ORAL | Status: DC
  Administered 2015-03-19 – 2015-03-22 (×4): 40 mg via ORAL
  Filled 2015-03-18 (×4): qty 1

## 2015-03-18 MED ORDER — DOCUSATE SODIUM 100 MG PO CAPS
100.0000 mg | ORAL_CAPSULE | Freq: Two times a day (BID) | ORAL | Status: DC
  Administered 2015-03-18 – 2015-03-22 (×8): 100 mg via ORAL
  Filled 2015-03-18 (×8): qty 1

## 2015-03-18 MED ORDER — ENOXAPARIN SODIUM 40 MG/0.4ML ~~LOC~~ SOLN: 40.0000 mg | SUBCUTANEOUS | Status: DC

## 2015-03-18 MED ORDER — CITALOPRAM HYDROBROMIDE 20 MG PO TABS
20.0000 mg | ORAL_TABLET | Freq: Two times a day (BID) | ORAL | Status: DC
  Administered 2015-03-18 – 2015-03-22 (×8): 20 mg via ORAL
  Filled 2015-03-18 (×9): qty 1

## 2015-03-18 MED ORDER — ACETAMINOPHEN 325 MG PO TABS
650.0000 mg | ORAL_TABLET | Freq: Four times a day (QID) | ORAL | Status: DC | PRN
  Filled 2015-03-18: qty 2

## 2015-03-18 MED ORDER — ALBUTEROL SULFATE HFA 108 (90 BASE) MCG/ACT IN AERS: 2.0000 | INHALATION_SPRAY | Freq: Three times a day (TID) | RESPIRATORY_TRACT | Status: DC | PRN

## 2015-03-18 MED ORDER — LORATADINE 10 MG PO TABS
10.0000 mg | ORAL_TABLET | Freq: Every day | ORAL | Status: DC
  Administered 2015-03-19 – 2015-03-22 (×4): 10 mg via ORAL
  Filled 2015-03-18 (×4): qty 1

## 2015-03-18 MED ORDER — ONDANSETRON HCL 4 MG PO TABS: 4.0000 mg | ORAL_TABLET | Freq: Four times a day (QID) | ORAL | Status: DC | PRN

## 2015-03-18 MED ORDER — MOMETASONE FURO-FORMOTEROL FUM 100-5 MCG/ACT IN AERO
2.0000 | INHALATION_SPRAY | Freq: Two times a day (BID) | RESPIRATORY_TRACT | Status: DC
  Administered 2015-03-18 – 2015-03-22 (×8): 2 via RESPIRATORY_TRACT
  Filled 2015-03-18: qty 8.8

## 2015-03-18 NOTE — Consult Note (Signed)
GI Inpatient Consult Note  Reason for Consult:  Bloody stools   Attending Requesting Consult: Dr. Posey Pronto  History of Present Illness: Christina Hahn is a 81 y.o. female with a significant history of dementia, hip fracture repair in July, 2016 is being treated for urinary tract infection.  Patient's daughter reports that patient's nursing aides at assisted living reported to her that she had bright red blood in her stool on Monday and then again on Tuesday had a large amount of bright red blood in the toilet and bright red blood was dripping from her rectum.  She again had a bowel movement with a large amount of bright red blood on Wednesday, has not had another bowel movement since then. Reports patient has daily bm's.  Patient reports lower abdominal pain, describes as cramping.  Daughter  Reports that her mother had a colectomy years ago but is unsure of when, where and why.  Daughter reports the patient had colonoscopy in [place]  That showed diverticulosis.  According to recent medical records, patient takes Prilosec once daily, is on iron.    In August she saw her primary care provider who in his note documented she was heme negative on stool cards x3.  Denies NSAID use except daily asa 81 mg.    Past Medical History:  Past Medical History  Diagnosis Date  . Arthritis   . Hypertension   . GERD (gastroesophageal reflux disease)   . Depression   . Eczema   . Asthma   . Anxiety   . Dementia   . Thyroid disease   . Stroke   . Chronic kidney disease     Problem List: Patient Active Problem List   Diagnosis Date Noted  . UTI (lower urinary tract infection) 03/18/2015  . Closed right hip fracture 01/08/2015  . Group A streptococcal infection 11/27/2014  . E-coli UTI 11/27/2014  . CAP (community acquired pneumonia) 11/27/2014  . Bacteremia 11/24/2014  . Acute renal failure 11/24/2014  . Urinary incontinence 11/23/2014  . Seizure disorder 11/23/2014  . TIA (transient ischemic  attack) 11/23/2014  . H/O: CVA (cerebrovascular accident) 11/23/2014  . CKD (chronic kidney disease) 11/23/2014  . Asthma 11/23/2014  . Ovarian cancer 11/23/2014  . Gastric ulcer 11/23/2014  . Depression 11/23/2014  . Hypothyroid 11/23/2014  . Hypertension 11/23/2014  . Dementia 11/23/2014  . Fever 11/23/2014    Past Surgical History: Past Surgical History  Procedure Laterality Date  . Cholecystectomy    . Tonsillectomy    . Abdominal hysterectomy    . Breast lumpectomy    . Laminectomy    . Cataract extraction    . Intramedullary (im) nail intertrochanteric Right 01/09/2015    Procedure: INTRAMEDULLARY (IM) NAIL INTERTROCHANTRIC;  Surgeon: Earnestine Leys, MD;  Location: [facility] ORS;  Service: Orthopedics;  Laterality: Right;    Allergies: Allergies  Allergen Reactions  . Antivert [Meclizine] Other (See Comments)    Reaction:  Dizziness   . Codeine Other (See Comments)    Reaction:  Gives pt nightmares   . Dramamine [Dimenhydrinate] Other (See Comments)    Reaction:  Dizziness   . Flonase [Fluticasone Propionate] Other (See Comments)    Reaction:  Unknown   . Nsaids Other (See Comments)    Reaction:  Unknown   . Penicillins Other (See Comments)    Reaction:  Unknown   . Shrimp [Shellfish Allergy] Other (See Comments)    Reaction:  Unknown   . Tape Other (See Comments)    Reaction:  Tears pts skin   . Voltaren [Diclofenac Sodium] Other (See Comments)    Reaction:  Unknown     Home Medications: Prescriptions prior to admission  Medication Sig Dispense Refill Last Dose  . oxycodone (OXY-IR) 5 MG capsule Take 5 mg by mouth every 4 (four) hours as needed for pain.     Marland Kitchen acetaminophen (TYLENOL) 325 MG tablet Take 650 mg by mouth every 4 (four) hours as needed for mild pain.    Past Week at Unknown time  . albuterol (PROVENTIL HFA;VENTOLIN HFA) 108 (90 BASE) MCG/ACT inhaler Inhale 2 puffs into the lungs every 8 (eight) hours as needed for shortness of breath.    PRN at PRN  .  albuterol (PROVENTIL) (2.5 MG/3ML) 0.083% nebulizer solution Take 2.5 mg by nebulization every 6 (six) hours as needed for shortness of breath.   PRN at PRN  . aspirin 81 MG chewable tablet Chew 81 mg by mouth daily.   01/08/2015 at Unknown time  . Calcium Carbonate-Vitamin D 600-400 MG-UNIT per tablet Take 1 tablet by mouth daily.   01/08/2015 at Unknown time  . citalopram (CELEXA) 20 MG tablet Take 20 mg by mouth 2 (two) times daily.   01/08/2015 at Unknown time  . docusate sodium (COLACE) 100 MG capsule Take 1 capsule (100 mg total) by mouth 2 (two) times daily. 10 capsule 0 01/08/2015 at Unknown time  . donepezil (ARICEPT) 5 MG tablet Take 5 mg by mouth daily.   01/08/2015 at Unknown time  . erythromycin ophthalmic ointment Place 1 application into the left eye 4 (four) times daily.    01/08/2015 at Unknown time  . feeding supplement, ENSURE ENLIVE, (ENSURE ENLIVE) LIQD Take 237 mLs by mouth 3 (three) times daily between meals. (Patient not taking: Reported on 01/08/2015) 237 mL 12   . Fluticasone-Salmeterol (ADVAIR) 250-50 MCG/DOSE AEPB Inhale 1 puff into the lungs 2 (two) times daily.   01/08/2015 at Unknown time  . lactose free nutrition (BOOST) LIQD Take 237 mLs by mouth 2 (two) times daily as needed (when pt is not eating well).    PRN at PRN  . levothyroxine (SYNTHROID, LEVOTHROID) 75 MCG tablet Take 75 mcg by mouth daily.    01/08/2015 at Unknown time  . loratadine (CLARITIN) 10 MG tablet Take 10 mg by mouth daily.   01/08/2015 at Unknown time  . montelukast (SINGULAIR) 10 MG tablet Take 10 mg by mouth daily.   01/08/2015 at Unknown time  . Multiple Vitamin (THEREMS) TABS Take 1 tablet by mouth 2 (two) times daily.   01/08/2015 at Unknown time  . OLANZapine (ZYPREXA) 2.5 MG tablet Take 1 tablet (2.5 mg total) by mouth at bedtime. 10 tablet 0 01/07/2015 at Unknown time  . omeprazole (PRILOSEC) 20 MG capsule Take 20 mg by mouth 2 (two) times daily.    01/08/2015 at Unknown time  . Polyethyl Glycol-Propyl  Glycol (SYSTANE OP) Apply 1 drop to eye 2 (two) times daily as needed (for dry eyes).   PRN at PRN  . trospium (SANCTURA) 20 MG tablet Take 20 mg by mouth daily.   01/08/2015 at Unknown time  . [DISCONTINUED] enoxaparin (LOVENOX) 30 MG/0.3ML injection Inject 0.3 mLs (30 mg total) into the skin daily. 14 Syringe 0    Home medication reconciliation was completed with the patient.   Scheduled Inpatient Medications:   . calcium-vitamin D  1 tablet Oral Daily  . cefTRIAXone (ROCEPHIN)  IV  1 g Intravenous Q24H  . citalopram  20  mg Oral BID  . docusate sodium  100 mg Oral BID  . donepezil  5 mg Oral Daily  . erythromycin  1 application Left Eye QID  . levothyroxine  75 mcg Oral QAC breakfast  . loratadine  10 mg Oral Daily  . mometasone-formoterol  2 puff Inhalation BID  . montelukast  10 mg Oral Daily  . multivitamin with minerals  1 tablet Oral Daily  . OLANZapine  2.5 mg Oral QHS  . pantoprazole  40 mg Oral Daily  . polyethylene glycol  17 g Oral Daily    Continuous Inpatient Infusions:   . sodium chloride 75 mL/hr at 03/18/15 1522    PRN Inpatient Medications:  acetaminophen **OR** acetaminophen, albuterol, ondansetron **OR** ondansetron (ZOFRAN) IV  Family History: family history includes CAD in her father; Lung cancer in her brother and sister; Pancreatic cancer in her mother.   Social History:   reports that she has never smoked. She does not have any smokeless tobacco history on file. She reports that she does not drink alcohol.   Review of Systems: Unable to determine due to patients dementia   Physical Examination: BP 93/78 mmHg  Pulse 54  Temp(Src) 98.5 F (36.9 C) (Oral)  Resp 20  SpO2 100% Gen: NAD, alert and oriented x 4.   Daughter was at bedside and provided majority of the history HEENT: PEERLA, EOMI, Neck: supple, no JVD or thyromegaly Chest: CTA bilaterally, no wheezes, crackles, or other adventitious sounds CV: RRR, no m/g/c/r Abd: soft, NT, ND, +BS  in all four quadrants; no HSM, guarding, ridigity, or rebound tenderness Ext: no edema, well perfused with 2+ pulses, Skin: no rash or lesions noted Lymph: no LAD Rectal exam: negative without mass, lesions or tenderness, stool guaiac negative.  Data: Lab Results  Component Value Date   WBC 5.5 03/18/2015   HGB 11.7* 03/18/2015   HCT 35.7 03/18/2015   MCV 93.3 03/18/2015   PLT 188 03/18/2015    Recent Labs Lab 03/18/15 1310  HGB 11.7*   Lab Results  Component Value Date   NA 137 03/18/2015   K 3.6 03/18/2015   CL 101 03/18/2015   CO2 31 03/18/2015   BUN 15 03/18/2015   CREATININE 1.30* 03/18/2015   Lab Results  Component Value Date   ALT 10* 03/18/2015   AST 20 03/18/2015   ALKPHOS 94 03/18/2015   BILITOT 0.3 03/18/2015   No results for input(s): APTT, INR, PTT in the last 168 hours. Assessment/Plan: Ms. Lohmann is a 81 y.o. female with reported episodes of BRBPR x 3 over past 3 days.  She was heme negative on rectal exam.  No external hemorrhoids noted.   Unable to verify past colonoscopies, daughter reports history of diverticulosis.  Hemoglobin was 11.7, MCV 93.3, BUN 15, creatinine 1.30  Recommendations: No evidence of bleeding at this time.  Please re consult if significant bleeding occurs.  Thank you for the consult. Please call with questions or concerns.  Salvadore Farber, PA-C  I personally performed these services.

## 2015-03-18 NOTE — Evaluation (Signed)
Physical Therapy Evaluation Patient Details Name: Christina Hahn MRN: [id number] DOB: May 27, 1923 Today's Date: 03/18/2015   History of Present Illness  Pt is a 81 y.o. female admitted to hospital with weakness and UTI.  Pt with h/o recurrent UTI's and of note, pt s/p ORIF R hip d/t fx 01/09/15 by Dr. Sabra Heck (per family is still [place]) and pt went from [facility] to ALF.  PMH includes: dementia, htn, GERD, CKD.  Clinical Impression  Currently pt demonstrates impairments with balance, strength, pain, and limitations with functional mobility (per pt's family, pt appears to still be PWB'ing on R LE since R hip ORIF in July d/t hip fx); pt also wears brace on her R forearm d/t h/o fx about 2-3 years ago but pt's daughter's all report pt was cleared to put full weight through it (RUE) and had been using a RW for therapy.  Prior to admission, pt was at [facility] and ambulating 15 feet with RW and assist but upon transfer to ALF in August pt was not receiving PT anymore (pt's daughter's reporting concern about this).  Pt lives at [facility] and was receiving assist for all ADL's and all functional mobility for safety (only transferring to w/c at ALF and gets pushed in w/c).  Currently pt is min assist with bed mobility and transfers (pt reporting being too fatigued to try to ambulate today and also reporting concerns of falling limiting ambulation attempt).  Pt would benefit from skilled PT to address above noted impairments and functional limitations.  Recommend pt discharge to ALF with prior level of assist and HHPT when medically appropriate.     Follow Up Recommendations Home health PT (24/7 assist at ALF (prior level before admission))    Equipment Recommendations       Recommendations for Other Services       Precautions / Restrictions Precautions Precautions: Fall Restrictions Weight Bearing Restrictions: Yes RLE Weight Bearing: Partial weight bearing Other Position/Activity Restrictions: s/p R hip ORIF  d/t fx 01/09/15      Mobility  Bed Mobility Overal bed mobility: Needs Assistance Bed Mobility: Supine to Sit;Sit to Supine     Supine to sit: Min assist Sit to supine: Min assist   General bed mobility comments: vc's and assist for safety  Transfers Overall transfer level: Needs assistance Equipment used: Rolling walker (2 wheeled) Transfers: Sit to/from Stand (x2 trials) Sit to Stand: Min assist         General transfer comment: pt stands fairly well with assist and RW but only able to stand for about 10 seconds each attempt d/t fatigue and pt reporting concerns for falling  Ambulation/Gait             General Gait Details: pt unable to take a step with vc's and assist d/t pt fatigue and pt also reporting concerns for falling  Stairs            Wheelchair Mobility    Modified Rankin (Stroke Patients Only)       Balance Overall balance assessment: Needs assistance Sitting-balance support: Bilateral upper extremity supported;Feet supported Sitting balance-Leahy Scale: Good     Standing balance support: Bilateral upper extremity supported (on RW) Standing balance-Leahy Scale: Fair                               Pertinent Vitals/Pain Pain Assessment: Faces Faces Pain Scale: Hurts little more Pain Location: R hip Pain Descriptors /  Indicators: Sore;Tender Pain Intervention(s): Limited activity within patient's tolerance;Monitored during session;Repositioned  Vitals stable and WFL throughout treatment session.    Home Living Family/patient expects to be discharged to:: Assisted living ([facility])               Home Equipment: Wheelchair - manual      Prior Function Level of Independence: Needs assistance   Gait / Transfers Assistance Needed: Pt was walking 15 feet at STR with walker but after transition to [facility] pt has not been receiving PT per family and only has been transferring to w/c since August.  ADL's /  Homemaking Assistance Needed: Assist for all ADL's        Hand Dominance        Extremity/Trunk Assessment   Upper Extremity Assessment: Generalized weakness           Lower Extremity Assessment: Generalized weakness (pt reporting R hip pain and with difficulty following commands limiting R LE assessment)         Communication   Communication: No difficulties  Cognition Arousal/Alertness: Awake/alert Behavior During Therapy: Impulsive Overall Cognitive Status: History of cognitive impairments - at baseline (Pt with dementia in [place]; pt demonstrating confusion during session in general and with orientation questions (pt was oriented to self but had difficulty stating her 3 daughter's names in room))       Memory: Decreased recall of precautions;Decreased short-term memory              General Comments   Nursing cleared pt for participation in physical therapy.  Pt agreeable to PT session.  Pt's 3 daughter's present during session.    Exercises        Assessment/Plan    PT Assessment Patient needs continued PT services  PT Diagnosis Difficulty walking;Generalized weakness   PT Problem List Decreased strength;Decreased activity tolerance;Decreased balance;Decreased mobility;Decreased safety awareness;Decreased knowledge of precautions;Pain  PT Treatment Interventions DME instruction;Gait training;Functional mobility training;Therapeutic activities;Balance training;Therapeutic exercise;Patient/family education;Wheelchair mobility training   PT Goals (Current goals can be found in the Care Plan section) Acute Rehab PT Goals Patient Stated Goal: to be able to move more PT Goal Formulation: With patient/family Time For Goal Achievement: 04/01/15 Potential to Achieve Goals: Fair    Frequency Min 2X/week   Barriers to discharge        Co-evaluation               End of Session Equipment Utilized During Treatment: Gait belt;Oxygen Activity Tolerance:  Patient limited by fatigue Patient left: in bed;with call bell/phone within reach;with bed alarm set;with family/visitor present Nurse Communication: Mobility status;Precautions;Weight bearing status (PWB R LE)         Time: 0076-2263 PT Time Calculation (min) (ACUTE ONLY): 18 min   Charges:   PT Evaluation $Initial PT Evaluation Tier I: 1 Procedure     PT G CodesLeitha Bleak April 01, 2015, 4:52 PM Leitha Bleak, [place]

## 2015-03-18 NOTE — H&P (Signed)
[facility] [place] NAME: Christina Hahn    MR#:  [id number]  DATE OF BIRTH:  1923-05-24  DATE OF ADMISSION:  03/18/2015  PRIMARY CARE PHYSICIAN: [name], MD   REQUESTING/REFERRING PHYSICIAN:   CHIEF COMPLAINT:  Weakness and UTI   HISTORY OF PRESENT ILLNESS: Christina Hahn  is a 81 y.o. female with a known history of  hypertension, GERD, depression, asthma, anxiety, dementia, chronic kidney disease who had a recent hip fracture in July. And she was at a rehabilitation facility subsequently discharged to an assisted living. Patient has had a history of recurrent urinary tract infections and went to see her primary care Rustyn Conery and according to the family she has not been not well has been very weak has been confused. Has not been able to ambulate much. She has been treated outpatient with oral anabiotic's for urinary tract infection but her urine still has a urine infection according to the primary care Samona Chihuahua's office. Therefore she referred to a hospitalization for admission. Patient has dementia and unable to provide me with any review of systems PAST MEDICAL HISTORY:   Past Medical History  Diagnosis Date  . Arthritis   . Hypertension   . GERD (gastroesophageal reflux disease)   . Depression   . Eczema   . Asthma   . Anxiety   . Dementia   . Thyroid disease   . Stroke   . Chronic kidney disease     PAST SURGICAL HISTORY:  Past Surgical History  Procedure Laterality Date  . Cholecystectomy    . Tonsillectomy    . Abdominal hysterectomy    . Breast lumpectomy    . Laminectomy    . Cataract extraction    . Intramedullary (im) nail intertrochanteric Right 01/09/2015    Procedure: INTRAMEDULLARY (IM) NAIL INTERTROCHANTRIC;  Surgeon: Earnestine Leys, MD;  Location: [facility] ORS;  Service: Orthopedics;  Laterality: Right;    SOCIAL HISTORY:  Social History  Substance Use Topics  . Smoking status: Never Smoker   . Smokeless  tobacco: Not on file  . Alcohol Use: No    FAMILY HISTORY:  Family History  Problem Relation Age of Onset  . Pancreatic cancer Mother   . CAD Father   . Lung cancer Sister   . Lung cancer Brother     DRUG ALLERGIES:  Allergies  Allergen Reactions  . Antivert [Meclizine] Other (See Comments)    Reaction:  Dizziness   . Codeine Other (See Comments)    Reaction:  Gives pt nightmares   . Dramamine [Dimenhydrinate] Other (See Comments)    Reaction:  Dizziness   . Flonase [Fluticasone Propionate] Other (See Comments)    Reaction:  Unknown   . Nsaids Other (See Comments)    Reaction:  Unknown   . Penicillins Other (See Comments)    Reaction:  Unknown   . Shrimp [Shellfish Allergy] Other (See Comments)    Reaction:  Unknown   . Tape Other (See Comments)    Reaction:  Tears pts skin   . Voltaren [Diclofenac Sodium] Other (See Comments)    Reaction:  Unknown     REVIEW OF SYSTEMS:   Due to dementia and unable to provide MEDICATIONS AT HOME:   Potassium chloride 20 mg every day and Singulair 10 daily omeprazole 21 tab by mouth daily loratadine 10 mg daily level thyroxine 75 g daily calcium 600 vitamin D 401 tab every day aspirin 81 1 topical daily done  as a pro-5 mg every day multivitamin 1 tab by mouth daily docusate 101 tab by mouth twice a day citalopram 20 mg 1 tab by mouth twice a day Advair 250/50 one puff twice a day erythromycin eye ointment apply to left eye 4 times a day Zyprexa 2.5 at bedtime milk of magnesia 30 ML's at bedtime Ventolin 2 puffs every 8 hours as needed oxycodone 5 mg 1-2 tabs every 4 hours as needed Tylenol 325 2 tablets every 4 hours as needed and albuterol nebulizer as needed Cipro 501 tab by mouth twice a day  PHYSICAL EXAMINATION:   VITAL SIGNS: There were no vitals taken for this visit.  GENERAL:  81 y.o.-year-old patient lying in the bed with no acute distress.  EYES: Pupils equal, round, reactive to light and accommodation. No scleral icterus.  Extraocular muscles intact.  HEENT: Head atraumatic, normocephalic. Oropharynx and nasopharynx clear.  NECK:  Supple, no jugular venous distention. No thyroid enlargement, no tenderness.  LUNGS: Normal breath sounds bilaterally, no wheezing, rales,rhonchi or crepitation. No use of accessory muscles of respiration.  CARDIOVASCULAR: S1, S2 normal. No murmurs, rubs, or gallops.  ABDOMEN: Soft, nontender, nondistended. Bowel sounds present. No organomegaly or mass.  EXTREMITIES: No pedal edema, cyanosis, or clubbing.  NEUROLOGIC: Cranial nerves II through XII are intact. Muscle strength 5/5 in all extremities. Sensation intact. Gait not checked.  PSYCHIATRIC: The patient is alert and oriented x 3.  SKIN: No obvious rash, lesion, or ulcer.   LABORATORY PANEL:   CBC No results for input(s): WBC, HGB, HCT, PLT, MCV, MCH, MCHC, RDW, LYMPHSABS, MONOABS, EOSABS, BASOSABS, BANDABS in the last 168 hours.  Invalid input(s): NEUTRABS, BANDSABD ------------------------------------------------------------------------------------------------------------------  Chemistries  No results for input(s): NA, K, CL, CO2, GLUCOSE, BUN, CREATININE, CALCIUM, MG, AST, ALT, ALKPHOS, BILITOT in the last 168 hours.  Invalid input(s): GFRCGP ------------------------------------------------------------------------------------------------------------------ CrCl cannot be calculated (Unknown ideal weight.). ------------------------------------------------------------------------------------------------------------------ No results for input(s): TSH, T4TOTAL, T3FREE, THYROIDAB in the last 72 hours.  Invalid input(s): FREET3   Coagulation profile No results for input(s): INR, PROTIME in the last 168 hours. ------------------------------------------------------------------------------------------------------------------- No results for input(s): DDIMER in the last 72  hours. -------------------------------------------------------------------------------------------------------------------  Cardiac Enzymes No results for input(s): CKMB, TROPONINI, MYOGLOBIN in the last 168 hours.  Invalid input(s): CK ------------------------------------------------------------------------------------------------------------------ Invalid input(s): POCBNP  ---------------------------------------------------------------------------------------------------------------  Urinalysis    Component Value Date/Time   COLORURINE YELLOW* 01/08/2015 1831   COLORURINE Yellow 10/20/2013 1732   APPEARANCEUR CLEAR* 01/08/2015 1831   APPEARANCEUR Hazy 10/20/2013 1732   LABSPEC 1.012 01/08/2015 1831   LABSPEC 1.016 10/20/2013 1732   PHURINE 6.0 01/08/2015 1831   PHURINE 6.0 10/20/2013 1732   GLUCOSEU NEGATIVE 01/08/2015 1831   GLUCOSEU Negative 10/20/2013 1732   HGBUR NEGATIVE 01/08/2015 1831   HGBUR Negative 10/20/2013 1732   BILIRUBINUR NEGATIVE 01/08/2015 1831   BILIRUBINUR Negative 10/20/2013 1732   KETONESUR NEGATIVE 01/08/2015 1831   KETONESUR Negative 10/20/2013 1732   PROTEINUR NEGATIVE 01/08/2015 1831   PROTEINUR 30 mg/dL 10/20/2013 1732   NITRITE NEGATIVE 01/08/2015 1831   NITRITE Negative 10/20/2013 1732   LEUKOCYTESUR 1+* 01/08/2015 1831   LEUKOCYTESUR 1+ 10/20/2013 1732     RADIOLOGY: No results found.  EKG: Orders placed or performed during the hospital encounter of 01/08/15  . ED EKG  . ED EKG  . EKG 12-Lead  . EKG 12-Lead  . EKG    IMPRESSION AND PLAN:  Patient is a 81 year old white female with recurrent urinary tract infections sent from primary care Lawsen Arnott's office  for generalized weakness  1. Generalized weakness likely due to urinary tract infection some dehydration at this time will repeat her urinalysis urine cultures place on ceftriaxone given low-dose IV fluids will obtain physical therapy evaluation  2. Chronic kidney disease  stage unknown at this point  3. Hypothyroidism continue Synthroid  4. Asthma continue Advair  5. Dementia continue donezipril  6. Code DO NOT RESUSCITATE    All the records are reviewed and case discussed with ED Addysyn Fern. Management plans discussed with the patient, family and they are in agreement.  CODE STATUS:    Code Status Orders        Start     Ordered   03/18/15 1241  Full code   Continuous     03/18/15 1241       TOTAL TIME TAKING CARE OF THIS PATIENT: 55 minutes.    Dustin Flock M.D on 03/18/2015 at 12:58 PM  Between 7am to 6pm - Pager - [phone]  After 6pm go to [email or web address] - password [email or web address] [facility]  Office  [phone]  CC: Primary care physician; [name], MD

## 2015-03-18 NOTE — Consult Note (Signed)
See note from [facility].  Pt with hgb 11.7 and had some BRBPR  Monday, Tuesday and yesterday but none since yesterday.  A rectal exam today showed brown heme neg stool.  She had a colonoscopy years ago in [place] which showed diverticulosis.  She had a hip repair in July.  No plans to do any GI tests at this time since the stool is now heme neg and no obvious bleeding.  May have been internal hemorrhoids.

## 2015-03-19 LAB — CBC
HCT: 31.9 % — ABNORMAL LOW (ref 35.0–47.0)
HEMOGLOBIN: 10.5 g/dL — AB (ref 12.0–16.0)
MCH: 30.5 pg (ref 26.0–34.0)
MCHC: 33 g/dL (ref 32.0–36.0)
MCV: 92.3 fL (ref 80.0–100.0)
Platelets: 161 10*3/uL (ref 150–440)
RBC: 3.45 MIL/uL — ABNORMAL LOW (ref 3.80–5.20)
RDW: 15.2 % — AB (ref 11.5–14.5)
WBC: 4.9 10*3/uL (ref 3.6–11.0)

## 2015-03-19 LAB — BASIC METABOLIC PANEL
Anion gap: 5 (ref 5–15)
BUN: 14 mg/dL (ref 6–20)
CALCIUM: 8.7 mg/dL — AB (ref 8.9–10.3)
CHLORIDE: 109 mmol/L (ref 101–111)
CO2: 30 mmol/L (ref 22–32)
CREATININE: 1.15 mg/dL — AB (ref 0.44–1.00)
GFR, EST AFRICAN AMERICAN: 47 mL/min — AB (ref >60)
GFR, EST NON AFRICAN AMERICAN: 41 mL/min — AB (ref >60)
Glucose, Bld: 87 mg/dL (ref 65–99)
Potassium: 4 mmol/L (ref 3.5–5.1)
SODIUM: 144 mmol/L (ref 135–145)

## 2015-03-19 LAB — MRSA PCR SCREENING: MRSA BY PCR: NEGATIVE

## 2015-03-19 NOTE — Progress Notes (Signed)
Physical Therapy Treatment Patient Details Name: Christina Hahn MRN: [id number] DOB: [birth year] Today's Date: 03/19/2015    History of Present Illness Pt is a 81 y.o. female admitted to hospital with weakness and UTI.  Pt with h/o recurrent UTI's and of note, pt s/p ORIF R hip d/t fx 01/09/15 by Dr. Sabra Heck (per family is still [place]) and pt went from [place] to ALF.  PMH includes: dementia, htn, GERD, CKD.    PT Comments    Pt requiring a little more assist to stand today with RW but standing limited d/t pt's concerns regarding falling and also c/o R hip pain.  Pt did fairly well with LE ex's in bed with assist.  Will continue to progress pt per pt tolerance.   Follow Up Recommendations  Home health PT;Other (comment) (Prior level of assist at ALF)     Equipment Recommendations       Recommendations for Other Services       Precautions / Restrictions Precautions Precautions: Fall Restrictions Weight Bearing Restrictions: Yes RLE Weight Bearing: Partial weight bearing Other Position/Activity Restrictions: s/p R hip ORIF d/t fx 01/09/15    Mobility  Bed Mobility Overal bed mobility: Needs Assistance Bed Mobility: Supine to Sit     Supine to sit: Min assist Sit to supine: Min assist   General bed mobility comments: vc's required for technique and increased time to scoot to edge of bed required; 2 assist required to boost up in bed end of session  Transfers Overall transfer level: Needs assistance Equipment used: Rolling walker (2 wheeled) Transfers: Sit to/from Stand Sit to Stand: Mod assist         General transfer comment: pt stood briefly 2x's (d/t pt reporting concerns regarding falling and had posterior lean); 3rd attempt pt stood for 10 seconds with improved posture but limited d/t pt c/o R hip pain  Ambulation/Gait             General Gait Details: unable to initiate gait d/t pt reporting R hip pain and concerns for falling   Stairs             Wheelchair Mobility    Modified Rankin (Stroke Patients Only)       Balance Overall balance assessment: Needs assistance Sitting-balance support: Bilateral upper extremity supported;Feet supported Sitting balance-Leahy Scale: Good     Standing balance support: Bilateral upper extremity supported (on RW) Standing balance-Leahy Scale: Poor                      Cognition Arousal/Alertness: Awake/alert Behavior During Therapy: Impulsive Overall Cognitive Status: History of cognitive impairments - at baseline       Memory: Decreased recall of precautions;Decreased short-term memory              Exercises   Performed semi-supine B LE therapeutic exercise x 10 reps:  Ankle pumps (AAROM B LE's); quad sets x3 second holds (AROM B LE's); SAQ's (AAROM R; AAROM L); heelslides (AAROM R; AAROM L), hip abd/adduction (AAROM R; AAROM L), and SLR (AAROM R; AAROM L).  Pt required vc's and tactile cues for correct technique with exercises.     General Comments   Nursing cleared pt for participation in physical therapy.  Pt agreeable to PT session. Pt's 2 daughters present part of session.      Pertinent Vitals/Pain Pain Assessment: Faces Faces Pain Scale: Hurts even more Pain Location: R hip Pain Descriptors / Indicators: Sore;Tender Pain Intervention(s): Limited activity within  patient's tolerance;Monitored during session;Repositioned;Patient requesting pain meds-RN notified  Vitals stable and WFL throughout treatment session.    Home Living                      Prior Function            PT Goals (current goals can now be found in the care plan section) Acute Rehab PT Goals Patient Stated Goal: to be able to move more PT Goal Formulation: With patient/family Time For Goal Achievement: 04/01/15 Potential to Achieve Goals: Fair Progress towards PT goals: Progressing toward goals    Frequency  Min 2X/week    PT Plan Current plan remains appropriate     Co-evaluation             End of Session Equipment Utilized During Treatment: Gait belt;Oxygen Activity Tolerance: Patient limited by fatigue;Patient limited by pain Patient left: in bed;with call bell/phone within reach;with bed alarm set;with nursing/sitter in room;with family/visitor present;with SCD's reapplied     Time: 1107-1130 PT Time Calculation (min) (ACUTE ONLY): 23 min  Charges:  $Therapeutic Exercise: 8-22 mins $Therapeutic Activity: 8-22 mins                    G CodesLeitha Bleak Apr 03, 2015, 12:14 PM Leitha Bleak, [place]

## 2015-03-19 NOTE — Clinical Documentation Improvement (Deleted)
Hospitalist      Please provide historical, or baseline comparative data to support your documented diagnosis of "acute renal query/acute kidney injury" OR if the diagnosis is not applicable, please amend your documentation as appropriate.   (Acute renal failure requires at least a 0.3 mg/dL increase or improvement over 24 - 48 hours or from the previous labs available.)  (please document your response in the progress note and discharge summary, not on the query form itself.)   Please exercise your independent, professional judgment when responding. A specific answer is not anticipated or expected.   Thank You, Erling Conte  RN BSN CCDS [phone] Health Information Management [place]

## 2015-03-19 NOTE — Care Management (Signed)
Spoke with patients family the [place] [facility] [place].  Family does not want the patient to return to [facility] assisted living. Discussed case with CSW and with Physical therapy. Investigating other discharge options. PT stated that patient follows commands and is skill able. More to follow.

## 2015-03-19 NOTE — Consult Note (Signed)
I will sign off, reconsult if bleeding.

## 2015-03-19 NOTE — Progress Notes (Signed)
[facility] [place] NAME: Christina Hahn    MR#:  [id number]  DATE OF BIRTH:  10/02/1922  SUBJECTIVE:  Patient presents to the hospital with recurrent urinary tract infection.  REVIEW OF SYSTEMS:    Review of Systems  Unable to perform ROS: dementia    Tolerating Diet: Yes      DRUG ALLERGIES:   Allergies  Allergen Reactions  . Antivert [Meclizine] Other (See Comments)    Reaction:  Dizziness   . Codeine Other (See Comments)    Reaction:  Gives pt nightmares   . Dramamine [Dimenhydrinate] Other (See Comments)    Reaction:  Dizziness   . Flonase [Fluticasone Propionate] Other (See Comments)    Reaction:  Unknown   . Nsaids Other (See Comments)    Reaction:  Unknown   . Penicillins Other (See Comments)    Reaction:  Unknown   . Shrimp [Shellfish Allergy] Other (See Comments)    Reaction:  Unknown   . Tape Other (See Comments)    Reaction:  Tears pts skin   . Voltaren [Diclofenac Sodium] Other (See Comments)    Reaction:  Unknown     VITALS:  Blood pressure 133/79, pulse 66, temperature 98.4 F (36.9 C), temperature source Oral, resp. rate 20, height 5\' 5"  (1.651 m), weight 68.221 kg (150 lb 6.4 oz), SpO2 100 %.  PHYSICAL EXAMINATION:   Physical Exam  Constitutional: She is oriented to person, place, and time and well-developed, well-nourished, and in no distress. No distress.  HENT:  Head: Normocephalic.  Eyes: No scleral icterus.  Neck: Normal range of motion. Neck supple. No JVD present. No tracheal deviation present.  Cardiovascular: Normal rate, regular rhythm and normal heart sounds.  Exam reveals no gallop and no friction rub.   No murmur heard. Pulmonary/Chest: Effort normal and breath sounds normal. No respiratory distress. She has no wheezes. She has no rales. She exhibits no tenderness.  Abdominal: Soft. Bowel sounds are normal. She exhibits no distension and no mass. There is no tenderness. There is no  rebound and no guarding.  Musculoskeletal: Normal range of motion. She exhibits no edema.  Neurological: She is alert and oriented to person, place, and time.  Skin: Skin is warm. No rash noted. No erythema.  Psychiatric: Affect and judgment normal.      LABORATORY PANEL:   CBC  Recent Labs Lab 03/19/15 0455  WBC 4.9  HGB 10.5*  HCT 31.9*  PLT 161   ------------------------------------------------------------------------------------------------------------------  Chemistries   Recent Labs Lab 03/18/15 1310 03/19/15 0455  NA 137 144  K 3.6 4.0  CL 101 109  CO2 31 30  GLUCOSE 125* 87  BUN 15 14  CREATININE 1.30* 1.15*  CALCIUM 8.8* 8.7*  AST 20  --   ALT 10*  --   ALKPHOS 94  --   BILITOT 0.3  --    ------------------------------------------------------------------------------------------------------------------  Cardiac Enzymes No results for input(s): TROPONINI in the last 168 hours. ------------------------------------------------------------------------------------------------------------------  RADIOLOGY:  No results found.   ASSESSMENT AND PLAN:   81 year old female with recurrent urinary tract infections of her PCPs office for generalized weakness.  1. Generalized weakness: This is secondary to urinary tract infection. I will obtain physical therapy consultation. In speaking with the daughter it appears the patient might need higher level of care such as scheduled in addition facility at discharge.  2. Urinary tract infection: Patient has had several urinary tract infection since June. Urine culture is  pending. Last urine culture in June was positive for Escherichia coli however resistant to several antibiotics including fluoroquinolones. Patient will need outpatient urology follow-up once her urinary tract infection has been treated.   3. Acute kidney injury: Creatinine has improved.  4. Hypothyroidism: Continue Synthroid  5. Asthma: Continue  Advair no signs of exacerbation  6. Dementia: Continue donepezil  7. Bloody stools: Patient was seen and evaluated by GI. It appears that this is likely internal hemorrhoids. Her hemoglobin remained stable.   Management plans discussed with the patient's daughter and she is in agreement.  CODE STATUS: DO NOT RESUSCITATE  TOTAL TIME TAKING CARE OF THIS PATIENT: 30 minutes.     POSSIBLE D/C 1-2 days, DEPENDING ON CLINICAL CONDITION.   Lerline Valdivia M.D on 03/19/2015 at 11:55 AM  Between 7am to 6pm - Pager - [phone] After 6pm go to [email or web address] - password [email or web address] [facility]  Office  [phone]  CC: Primary care physician; [name], MD

## 2015-03-19 NOTE — Clinical Social Work Note (Signed)
Clinical Social Work Assessment  Patient Details  Name: Christina Hahn MRN: [id number] Date of Birth: May 01, 1923  Date of referral:  03/19/15               Reason for consult:  Facility Placement                Permission sought to share information with:  Facility Art therapist granted to share information::  Yes, Verbal Permission Granted  Name::        Agency::     Relationship::     Contact Information:     Housing/Transportation Living arrangements for the past 2 months:  [place] of Information:  Adult Children Patient Interpreter Needed:  None Criminal Activity/Legal Involvement Pertinent to Current Situation/Hospitalization:  No - Comment as needed Significant Relationships:  Adult Children Lives with:  Facility Resident Do you feel safe going back to the place where you live?  No Need for family participation in patient care:  Yes (Comment)  Care giving concerns:  Patient is a resident of 5 years at [facility]   Facilities manager / plan:  CSW met with patient's daughter's this afternoon after RN CM spoke with them and expressed concern about patient returning [facility]. Patient's daughters are: [name]: [phone] and Evalina Field: [phone]. CSW spoke with them at length this afternoon. Patient was lying in bed, pleasantly confused throughout our conversation. Patient's daughters do not feel as though ALF is the appropriate level of care for patient and that they are not able to provide her PT as needed, not able to ensure she is fed, etc. They would like for STR then to convert to LTC at a nursing home. Regardless, they do not want patient to return to [facility]. PT has recommended home health however due to patient's recurrent uti and possible need for closer supervision for intake of fluids as she was dehydrated on admission. CSW has initiated a bed search to see if there would be a SNF that is willing to  take patient for STR then convert her to LTC. Patient is in her medicare copay days and has medicaid secondary.   Employment status:    Forensic scientist:  Information systems manager, Medicaid In [place] PT Recommendations:  Home with [place] / Referral to community resources:     Patient/Family's Response to care:  Patient's daughters very grateful for CSW visit.  Patient/Family's Understanding of and Emotional Response to Diagnosis, Current Treatment, and Prognosis:  Patient's daughters are concerned for patient's well being and feel as though it can be best met at a SNF level of care.   Emotional Assessment Appearance:  Appears stated age Attitude/Demeanor/Rapport:   (pleasant and confused) Affect (typically observed):  Pleasant Orientation:  Fluctuating Orientation (Suspected and/or reported Sundowners) Alcohol / Substance use:  Not Applicable Psych involvement (Current and /or in the community):  No (Comment)  Discharge Needs  Concerns to be addressed:  Care Coordination Readmission within the last 30 days:  No Current discharge risk:  None Barriers to Discharge:  No Barriers Identified   Shela Leff, LCSW 03/19/2015, 4:14 PM

## 2015-03-20 MED ORDER — DEXTROSE 5 % IV SOLN
1.0000 g | INTRAVENOUS | Status: DC
  Administered 2015-03-20 – 2015-03-21 (×2): 1 g via INTRAVENOUS
  Filled 2015-03-20 (×4): qty 10

## 2015-03-20 NOTE — Progress Notes (Signed)
[facility] at [place] NAME: Christina Hahn    MR#:  [id number]  DATE OF BIRTH:  1923/06/08  SUBJECTIVE:  Patient presents to the hospital with recurrent urinary tract infection. Walked to bathroom with support, no complains.  REVIEW OF SYSTEMS:    Review of Systems  Unable to perform ROS: dementia    Tolerating Diet: Yes   DRUG ALLERGIES:   Allergies  Allergen Reactions  . Antivert [Meclizine] Other (See Comments)    Reaction:  Dizziness   . Codeine Other (See Comments)    Reaction:  Gives pt nightmares   . Dramamine [Dimenhydrinate] Other (See Comments)    Reaction:  Dizziness   . Flonase [Fluticasone Propionate] Other (See Comments)    Reaction:  Unknown   . Nsaids Other (See Comments)    Reaction:  Unknown   . Penicillins Other (See Comments)    Reaction:  Unknown   . Shrimp [Shellfish Allergy] Other (See Comments)    Reaction:  Unknown   . Tape Other (See Comments)    Reaction:  Tears pts skin   . Voltaren [Diclofenac Sodium] Other (See Comments)    Reaction:  Unknown     VITALS:  Blood pressure 173/73, pulse 64, temperature 98.1 F (36.7 C), temperature source Oral, resp. rate 16, height 5\' 5"  (1.651 m), weight 68.221 kg (150 lb 6.4 oz), SpO2 99 %.  PHYSICAL EXAMINATION:   Physical Exam  Constitutional: She is oriented to person, place, and time and well-developed, well-nourished, and in no distress. No distress.  HENT:  Head: Normocephalic.  Eyes: No scleral icterus.  Neck: Normal range of motion. Neck supple. No JVD present. No tracheal deviation present.  Cardiovascular: Normal rate, regular rhythm and normal heart sounds.  Exam reveals no gallop and no friction rub.   No murmur heard. Pulmonary/Chest: Effort normal and breath sounds normal. No respiratory distress. She has no wheezes. She has no rales. She exhibits no tenderness.  Abdominal: Soft. Bowel sounds are normal. She exhibits no distension and no  mass. There is no tenderness. There is no rebound and no guarding.  Musculoskeletal: Normal range of motion. She exhibits no edema.  Neurological: She is alert and oriented to person, place, and time.  Skin: Skin is warm. No rash noted. No erythema.  Psychiatric: Affect and judgment normal.    LABORATORY PANEL:   CBC  Recent Labs Lab 03/19/15 0455  WBC 4.9  HGB 10.5*  HCT 31.9*  PLT 161   ------------------------------------------------------------------------------------------------------------------  Chemistries   Recent Labs Lab 03/18/15 1310 03/19/15 0455  NA 137 144  K 3.6 4.0  CL 101 109  CO2 31 30  GLUCOSE 125* 87  BUN 15 14  CREATININE 1.30* 1.15*  CALCIUM 8.8* 8.7*  AST 20  --   ALT 10*  --   ALKPHOS 94  --   BILITOT 0.3  --    ------------------------------------------------------------------------------------------------------------------  Cardiac Enzymes No results for input(s): TROPONINI in the last 168 hours. ------------------------------------------------------------------------------------------------------------------  RADIOLOGY:  No results found.   ASSESSMENT AND PLAN:   81 year old female with recurrent urinary tract infections of her PCPs office for generalized weakness.  1. Generalized weakness: This is secondary to urinary tract infection.   appreciated physical therapy consultation.     Need rehab- SW is working on that.  2. Urinary tract infection: Patient has had several urinary tract infection since June.  On rocephin IV, Pending cx result. Need outpatient urology follow-up once her  urinary tract infection has been treated.   3. Acute kidney injury: Creatinine has improved.  4. Hypothyroidism: Continue Synthroid  5. Asthma: Continue Advair no signs of exacerbation  6. Dementia: Continue donepezil  7. Bloody stools: Patient was seen and evaluated by GI. It appears that this is likely internal hemorrhoids. Her hemoglobin  remained stable.   Management plans discussed with the patient's daughter and she is in agreement.  CODE STATUS: DO NOT RESUSCITATE  TOTAL TIME TAKING CARE OF THIS PATIENT: 30 minutes.     POSSIBLE D/C 1-2 days, DEPENDING ON CLINICAL CONDITION.   Vaughan Basta M.D on 03/20/2015 at 12:46 PM  Between 7am to 6pm - Pager - [phone] After 6pm go to [email or web address] - password [email or web address] [facility]  Office  [phone]  CC: Primary care physician; [name], MD

## 2015-03-21 MED ORDER — ONDANSETRON HCL 4 MG PO TABS
4.0000 mg | ORAL_TABLET | Freq: Three times a day (TID) | ORAL | Status: DC
  Administered 2015-03-21 – 2015-03-22 (×3): 4 mg via ORAL
  Filled 2015-03-21 (×3): qty 1

## 2015-03-21 NOTE — Progress Notes (Signed)
[facility] at [place] NAME: Christina Hahn    MR#:  [id number]  DATE OF BIRTH:  [birth year]  SUBJECTIVE:  Patient presents to the hospital with recurrent urinary tract infection.  She had nausea after breakfast today.  daughter in room , said pt is not able to walk at her baseline- and need full supportive care also due to dementia.  REVIEW OF SYSTEMS:    Review of Systems  Unable to perform ROS: dementia   Tolerating Diet: Yes   DRUG ALLERGIES:   Allergies  Allergen Reactions  . Antivert [Meclizine] Other (See Comments)    Reaction:  Dizziness   . Codeine Other (See Comments)    Reaction:  Gives pt nightmares   . Dramamine [Dimenhydrinate] Other (See Comments)    Reaction:  Dizziness   . Flonase [Fluticasone Propionate] Other (See Comments)    Reaction:  Unknown   . Nsaids Other (See Comments)    Reaction:  Unknown   . Penicillins Other (See Comments)    Reaction:  Unknown   . Shrimp [Shellfish Allergy] Other (See Comments)    Reaction:  Unknown   . Tape Other (See Comments)    Reaction:  Tears pts skin   . Voltaren [Diclofenac Sodium] Other (See Comments)    Reaction:  Unknown     VITALS:  Blood pressure 128/49, pulse 52, temperature 97.5 F (36.4 C), temperature source Oral, resp. rate 16, height 5\' 5"  (1.651 m), weight 68.221 kg (150 lb 6.4 oz), SpO2 99 %.  PHYSICAL EXAMINATION:   Physical Exam  Constitutional: She is well-developed, well-nourished, and in no distress. No distress.  HENT:  Head: Normocephalic.  Eyes: No scleral icterus.  Neck: Normal range of motion. Neck supple. No JVD present. No tracheal deviation present.  Cardiovascular: Normal rate, regular rhythm and normal heart sounds.   No murmur heard. Pulmonary/Chest: Effort normal and breath sounds normal. No respiratory distress. She has no wheezes. She has no rales. She exhibits no tenderness.  Abdominal: Soft. Bowel sounds are normal. She  exhibits no distension and no mass. There is no tenderness. There is no guarding.  Musculoskeletal: Normal range of motion. She exhibits no edema.  Neurological: She is alert.  Oriented to self.  Skin: Skin is warm. No rash noted. No erythema.    LABORATORY PANEL:   CBC  Recent Labs Lab 03/19/15 0455  WBC 4.9  HGB 10.5*  HCT 31.9*  PLT 161   ------------------------------------------------------------------------------------------------------------------  Chemistries   Recent Labs Lab 03/18/15 1310 03/19/15 0455  NA 137 144  K 3.6 4.0  CL 101 109  CO2 31 30  GLUCOSE 125* 87  BUN 15 14  CREATININE 1.30* 1.15*  CALCIUM 8.8* 8.7*  AST 20  --   ALT 10*  --   ALKPHOS 94  --   BILITOT 0.3  --    ------------------------------------------------------------------------------------------------------------------  Cardiac Enzymes No results for input(s): TROPONINI in the last 168 hours. ------------------------------------------------------------------------------------------------------------------  RADIOLOGY:  No results found.   ASSESSMENT AND PLAN:   81 year old female with recurrent urinary tract infections of her PCPs office for generalized weakness.  1. Generalized weakness: This is secondary to urinary tract infection.   appreciated physical therapy consultation.     Need rehab- SW is working on that.    Not able to walk at baseline as per daughter.  2. Urinary tract infection: Patient has had several urinary tract infection since June.  On rocephin IV, reviewed- and  sensitive E coli per cx result. Need outpatient urology follow-up once her urinary tract infection has been treated.   3. Acute kidney injury: Creatinine has improved.  4. Hypothyroidism: Continue Synthroid  5. Asthma: Continue Advair no signs of exacerbation  6. Dementia: Continue donepezil  7. Bloody stools: Patient was seen and evaluated by GI. It appears that this is likely internal  hemorrhoids. Her hemoglobin remained stable.   Management plans discussed with the patient's daughter in room and she is in agreement.  CODE STATUS: DO NOT RESUSCITATE  TOTAL TIME TAKING CARE OF THIS PATIENT: 30 minutes.    POSSIBLE D/C 1-2 days, DEPENDING ON CLINICAL CONDITION.   Vaughan Basta M.D on 03/21/2015 at 1:59 PM  Between 7am to 6pm - Pager - [phone] After 6pm go to [email or web address] - password [email or web address] [facility]  Office  [phone]  CC: Primary care physician; [name], MD

## 2015-03-22 DIAGNOSIS — I10 Essential (primary) hypertension: Secondary | ICD-10-CM | POA: Diagnosis not present

## 2015-03-22 DIAGNOSIS — N3281 Overactive bladder: Secondary | ICD-10-CM | POA: Diagnosis not present

## 2015-03-22 DIAGNOSIS — N183 Chronic kidney disease, stage 3 (moderate): Secondary | ICD-10-CM | POA: Diagnosis not present

## 2015-03-22 DIAGNOSIS — E039 Hypothyroidism, unspecified: Secondary | ICD-10-CM | POA: Diagnosis not present

## 2015-03-22 DIAGNOSIS — R1312 Dysphagia, oropharyngeal phase: Secondary | ICD-10-CM | POA: Diagnosis not present

## 2015-03-22 DIAGNOSIS — K219 Gastro-esophageal reflux disease without esophagitis: Secondary | ICD-10-CM | POA: Diagnosis not present

## 2015-03-22 DIAGNOSIS — B309 Viral conjunctivitis, unspecified: Secondary | ICD-10-CM | POA: Diagnosis not present

## 2015-03-22 DIAGNOSIS — S72141D Displaced intertrochanteric fracture of right femur, subsequent encounter for closed fracture with routine healing: Secondary | ICD-10-CM | POA: Diagnosis not present

## 2015-03-22 DIAGNOSIS — R600 Localized edema: Secondary | ICD-10-CM | POA: Diagnosis not present

## 2015-03-22 DIAGNOSIS — J45901 Unspecified asthma with (acute) exacerbation: Secondary | ICD-10-CM | POA: Diagnosis not present

## 2015-03-22 DIAGNOSIS — Z5189 Encounter for other specified aftercare: Secondary | ICD-10-CM | POA: Diagnosis not present

## 2015-03-22 DIAGNOSIS — D649 Anemia, unspecified: Secondary | ICD-10-CM | POA: Diagnosis not present

## 2015-03-22 DIAGNOSIS — N189 Chronic kidney disease, unspecified: Secondary | ICD-10-CM | POA: Diagnosis not present

## 2015-03-22 DIAGNOSIS — E038 Other specified hypothyroidism: Secondary | ICD-10-CM | POA: Diagnosis not present

## 2015-03-22 DIAGNOSIS — N390 Urinary tract infection, site not specified: Secondary | ICD-10-CM | POA: Diagnosis not present

## 2015-03-22 DIAGNOSIS — R41841 Cognitive communication deficit: Secondary | ICD-10-CM | POA: Diagnosis not present

## 2015-03-22 DIAGNOSIS — M19131 Post-traumatic osteoarthritis, right wrist: Secondary | ICD-10-CM | POA: Diagnosis not present

## 2015-03-22 DIAGNOSIS — I2510 Atherosclerotic heart disease of native coronary artery without angina pectoris: Secondary | ICD-10-CM | POA: Diagnosis not present

## 2015-03-22 DIAGNOSIS — S72009A Fracture of unspecified part of neck of unspecified femur, initial encounter for closed fracture: Secondary | ICD-10-CM | POA: Diagnosis not present

## 2015-03-22 DIAGNOSIS — F0390 Unspecified dementia without behavioral disturbance: Secondary | ICD-10-CM | POA: Diagnosis not present

## 2015-03-22 DIAGNOSIS — F329 Major depressive disorder, single episode, unspecified: Secondary | ICD-10-CM | POA: Diagnosis not present

## 2015-03-22 DIAGNOSIS — R809 Proteinuria, unspecified: Secondary | ICD-10-CM | POA: Diagnosis not present

## 2015-03-22 DIAGNOSIS — F419 Anxiety disorder, unspecified: Secondary | ICD-10-CM | POA: Diagnosis not present

## 2015-03-22 DIAGNOSIS — M6281 Muscle weakness (generalized): Secondary | ICD-10-CM | POA: Diagnosis not present

## 2015-03-22 DIAGNOSIS — E559 Vitamin D deficiency, unspecified: Secondary | ICD-10-CM | POA: Diagnosis not present

## 2015-03-22 LAB — URINE CULTURE

## 2015-03-22 MED ORDER — CEFUROXIME AXETIL 250 MG PO TABS: 250.0000 mg | ORAL_TABLET | Freq: Two times a day (BID) | ORAL | Status: AC

## 2015-03-22 MED ORDER — OXYCODONE HCL 5 MG PO CAPS: 5.0000 mg | ORAL_CAPSULE | Freq: Four times a day (QID) | ORAL | Status: AC | PRN

## 2015-03-22 NOTE — Care Management Important Message (Signed)
Important Message  Patient Details  Name: Christina Hahn MRN: [id number] Date of Birth: June 14, 1923   Medicare Important Message Given:  Yes-second notification given    Alvie Heidelberg, RN 03/22/2015, 11:27 AM

## 2015-03-22 NOTE — Progress Notes (Signed)
Patient for d/c today to SNF bed at [facility]- daughters and patient agreeable to this plan- plan transfer via family/car.  Eduard Clos, MSW, [place]

## 2015-03-22 NOTE — Clinical Documentation Improvement (Signed)
Hospitalist      Please provide clinical information, historical, or baseline comparative data to support the documented diagnosis of "Acute Kidney Injury" or if the diagnosis is not applicable, please amend your documentation as appropriate.  Clinical Information:  Change in Creatinine this admission is only 0.15 mg/dL. Component     Latest Ref Rng 03/18/2015 03/19/2015  BUN     6 - 20 mg/dL 15 14  Creatinine     0.44 - 1.00 mg/dL 1.30 (H) 1.15 (H)  EGFR (Non-African Amer.)     >60 mL/min 35 (L) 41 (L)    (please document your response in the progress notes and discharge summary, not on the query form itself.)   Please exercise your independent, professional judgment when responding. A specific answer is not anticipated or expected.   Thank You, Erling Conte  RN BSN CCDS [phone] Health Information Management [place]

## 2015-03-22 NOTE — Discharge Summary (Addendum)
[facility] NAME: Christina Hahn    MR#:  [id number]  DATE OF BIRTH:  10/26/1922  DATE OF ADMISSION:  03/18/2015 ADMITTING PHYSICIAN: Dustin Flock, MD  DATE OF DISCHARGE: 03/22/2015  PRIMARY CARE PHYSICIAN: [name], MD    ADMISSION DIAGNOSIS:  UTI  DISCHARGE DIAGNOSIS:  Active Problems:   UTI (lower urinary tract infection)   SECONDARY DIAGNOSIS:   Past Medical History  Diagnosis Date  . Arthritis   . Hypertension   . GERD (gastroesophageal reflux disease)   . Depression   . Eczema   . Asthma   . Anxiety   . Dementia   . Thyroid disease   . Stroke   . Chronic kidney disease     HOSPITAL COURSE:   81 year old female with recurrent urinary tract infections of her PCPs office for generalized weakness.  1. Generalized weakness: This is secondary to urinary tract infection.  appreciated physical therapy consultation.   Need rehab- SW is working on that.  Not able to walk properly at baseline as per daughter.  2. Urinary tract infection: Patient has had several urinary tract infection since June.  On rocephin IV, reviewed- and sensitive E coli per cx result. Need outpatient urology follow-up once her urinary tract infection has been treated.   3. Acute kidney injury: suspected on admission, but her creatinine is always at baseline, so this WAS NOT present on admission.  4. Hypothyroidism: Continue Synthroid  5. Asthma: Continue Advair no signs of exacerbation  6. Dementia: Continue donepezil  7. Blood in stools: Patient was seen and evaluated by GI. It appears that this is likely internal hemorrhoids. Her hemoglobin remained stable.  DISCHARGE CONDITIONS:   Stable.  CONSULTS OBTAINED:  Treatment Team:  Manya Silvas, MD  DRUG ALLERGIES:   Allergies  Allergen Reactions  . Antivert [Meclizine] Other (See Comments)    Reaction:  Dizziness   . Codeine Other (See Comments)   Reaction:  Gives pt nightmares   . Dramamine [Dimenhydrinate] Other (See Comments)    Reaction:  Dizziness   . Flonase [Fluticasone Propionate] Other (See Comments)    Reaction:  Unknown   . Nsaids Other (See Comments)    Reaction:  Unknown   . Penicillins Other (See Comments)    Reaction:  Unknown   . Shrimp [Shellfish Allergy] Other (See Comments)    Reaction:  Unknown   . Tape Other (See Comments)    Reaction:  Tears pts skin   . Voltaren [Diclofenac Sodium] Other (See Comments)    Reaction:  Unknown     DISCHARGE MEDICATIONS:   Current Discharge Medication List    START taking these medications   Details  cefUROXime (CEFTIN) 250 MG tablet Take 1 tablet (250 mg total) by mouth 2 (two) times daily with a meal. Qty: 14 tablet, Refills: 0      CONTINUE these medications which have CHANGED   Details  oxycodone (OXY-IR) 5 MG capsule Take 1 capsule (5 mg total) by mouth every 6 (six) hours as needed for pain. Qty: 20 capsule, Refills: 0      CONTINUE these medications which have NOT CHANGED   Details  magnesium hydroxide (MILK OF MAGNESIA) 400 MG/5ML suspension Take 30 mLs by mouth at bedtime.    acetaminophen (TYLENOL) 325 MG tablet Take 650 mg by mouth every 4 (four) hours as needed for mild pain.     albuterol (PROVENTIL HFA;VENTOLIN HFA) 108 (90 BASE)  MCG/ACT inhaler Inhale 2 puffs into the lungs every 8 (eight) hours as needed for shortness of breath.     albuterol (PROVENTIL) (2.5 MG/3ML) 0.083% nebulizer solution Take 2.5 mg by nebulization every 6 (six) hours as needed for shortness of breath.    aspirin 81 MG chewable tablet Chew 81 mg by mouth daily.    Calcium Carbonate-Vitamin D 600-400 MG-UNIT per tablet Take 1 tablet by mouth daily.    citalopram (CELEXA) 20 MG tablet Take 20 mg by mouth 2 (two) times daily.    docusate sodium (COLACE) 100 MG capsule Take 1 capsule (100 mg total) by mouth 2 (two) times daily. Qty: 10 capsule, Refills: 0    donepezil  (ARICEPT) 5 MG tablet Take 5 mg by mouth daily.    erythromycin ophthalmic ointment Place 1 application into the left eye 4 (four) times daily.     !! feeding supplement, ENSURE ENLIVE, (ENSURE ENLIVE) LIQD Take 237 mLs by mouth 3 (three) times daily between meals. Qty: 237 mL, Refills: 12    Fluticasone-Salmeterol (ADVAIR) 250-50 MCG/DOSE AEPB Inhale 1 puff into the lungs 2 (two) times daily.    !! lactose free nutrition (BOOST) LIQD Take 237 mLs by mouth 2 (two) times daily as needed (when pt is not eating well).     levothyroxine (SYNTHROID, LEVOTHROID) 75 MCG tablet Take 75 mcg by mouth daily.     loratadine (CLARITIN) 10 MG tablet Take 10 mg by mouth daily.    montelukast (SINGULAIR) 10 MG tablet Take 10 mg by mouth daily.    Multiple Vitamin (THEREMS) TABS Take 1 tablet by mouth 2 (two) times daily.    OLANZapine (ZYPREXA) 2.5 MG tablet Take 1 tablet (2.5 mg total) by mouth at bedtime. Qty: 10 tablet, Refills: 0    omeprazole (PRILOSEC) 20 MG capsule Take 20 mg by mouth 2 (two) times daily.     Polyethyl Glycol-Propyl Glycol (SYSTANE OP) Apply 1 drop to eye 2 (two) times daily as needed (for dry eyes).    trospium (SANCTURA) 20 MG tablet Take 20 mg by mouth daily.     !! - Potential duplicate medications found. Please discuss with provider.       DISCHARGE INSTRUCTIONS:    Follow with urology clinic in 2 weeks.  If you experience worsening of your admission symptoms, develop shortness of breath, life threatening emergency, suicidal or homicidal thoughts you must seek medical attention immediately by calling 911 or calling your MD immediately  if symptoms less severe.  You Must read complete instructions/literature along with all the possible adverse reactions/side effects for all the Medicines you take and that have been prescribed to you. Take any new Medicines after you have completely understood and accept all the possible adverse reactions/side effects.   Please  note  You were cared for by a hospitalist during your hospital stay. If you have any questions about your discharge medications or the care you received while you were in the hospital after you are discharged, you can call the unit and asked to speak with the hospitalist on call if the hospitalist that took care of you is not available. Once you are discharged, your primary care physician will handle any further medical issues. Please note that NO REFILLS for any discharge medications will be authorized once you are discharged, as it is imperative that you return to your primary care physician (or establish a relationship with a primary care physician if you do not have one) for your aftercare  needs so that they can reassess your need for medications and monitor your lab values.    Today   CHIEF COMPLAINT:  No chief complaint on file.   HISTORY OF PRESENT ILLNESS:  Christina Hahn  is a 81 y.o. female with a known history of hypertension, GERD, depression, asthma, anxiety, dementia, chronic kidney disease who had a recent hip fracture in July. And she was at a rehabilitation facility subsequently discharged to an assisted living. Patient has had a history of recurrent urinary tract infections and went to see her primary care provider and according to the family she has not been not well has been very weak has been confused. Has not been able to ambulate much. She has been treated outpatient with oral anabiotic's for urinary tract infection but her urine still has a urine infection according to the primary care provider's office. Therefore she referred to a hospitalization for admission. Patient has dementia and unable to provide me with any review of systems  VITAL SIGNS:  Blood pressure 118/68, pulse 55, temperature 98.7 F (37.1 C), temperature source Oral, resp. rate 18, height 5\' 5"  (1.651 m), weight 68.221 kg (150 lb 6.4 oz), SpO2 100 %.  I/O:   Intake/Output Summary (Last 24 hours) at 03/22/15  0912 Last data filed at 03/22/15 0808  Gross per 24 hour  Intake    480 ml  Output    100 ml  Net    380 ml   Pt is incontinent so not able to measure properly.  PHYSICAL EXAMINATION:   Constitutional: She is well-developed, well-nourished, and in no distress. No distress.  HENT:  Head: Normocephalic.  Eyes: No scleral icterus.  Neck: Normal range of motion. Neck supple. No JVD present. No tracheal deviation present.  Cardiovascular: Normal rate, regular rhythm and normal heart sounds.  No murmur heard. Pulmonary/Chest: Effort normal and breath sounds normal. No respiratory distress. She has no wheezes. She has no rales. She exhibits no tenderness.  Abdominal: Soft. Bowel sounds are normal. She exhibits no distension and no mass. There is no tenderness. There is no guarding.  Musculoskeletal: Normal range of motion. She exhibits no edema.  Neurological: She is alert.  Oriented to self.  Skin: Skin is warm. No rash noted. No erythema.   DATA REVIEW:   CBC  Recent Labs Lab 03/19/15 0455  WBC 4.9  HGB 10.5*  HCT 31.9*  PLT 161    Chemistries   Recent Labs Lab 03/18/15 1310 03/19/15 0455  NA 137 144  K 3.6 4.0  CL 101 109  CO2 31 30  GLUCOSE 125* 87  BUN 15 14  CREATININE 1.30* 1.15*  CALCIUM 8.8* 8.7*  AST 20  --   ALT 10*  --   ALKPHOS 94  --   BILITOT 0.3  --     Cardiac Enzymes No results for input(s): TROPONINI in the last 168 hours.  Microbiology Results  Results for orders placed or performed during the hospital encounter of 03/18/15  Urine culture     Status: None (Preliminary result)   Collection Time: 03/18/15  4:07 PM  Result Value Ref Range Status   Specimen Description URINE, CLEAN CATCH  Final   Special Requests NONE  Final   Culture   Final    80,000 COLONIES/ml ESCHERICHIA COLI 50,000 COLONIES/mL GRAM POSITIVE COCCI IDENTIFICATION AND SUSCEPTIBILITIES TO FOLLOW MIXED BACTERIAL ORGANISMS    Report Status PENDING  Incomplete    Organism ID, Bacteria ESCHERICHIA COLI  Final  Susceptibility   Escherichia coli - MIC*    AMPICILLIN >=32 RESISTANT Resistant     CEFAZOLIN <=4 SENSITIVE Sensitive     CEFTRIAXONE Value in next row Sensitive      SENSITIVE<=1    CIPROFLOXACIN Value in next row Resistant      SENSITIVE<=1    GENTAMICIN Value in next row Sensitive      SENSITIVE<=1    IMIPENEM Value in next row Sensitive      SENSITIVE<=1    NITROFURANTOIN Value in next row Sensitive      SENSITIVE<=1    TRIMETH/SULFA Value in next row Sensitive      SENSITIVE<=1    Extended ESBL Value in next row Sensitive      NEGATIVENEGATIVE    PIP/TAZO Value in next row Sensitive      SENSITIVE<=4    LEVOFLOXACIN Value in next row Resistant      RESISTANT>=8    * 80,000 COLONIES/ml ESCHERICHIA COLI  MRSA PCR Screening     Status: None   Collection Time: 03/19/15  8:57 AM  Result Value Ref Range Status   MRSA by PCR NEGATIVE NEGATIVE Final    Comment:        The GeneXpert MRSA Assay (FDA approved for NASAL specimens only), is one component of a comprehensive MRSA colonization surveillance program. It is not intended to diagnose MRSA infection nor to guide or monitor treatment for MRSA infections.     RADIOLOGY:  No results found.   Management plans discussed with the patient, family and they are in agreement.  CODE STATUS:     Code Status Orders        Start     Ordered   03/18/15 1829  Do not attempt resuscitation (DNR)   Continuous    Question Answer Comment  In the event of cardiac or respiratory ARREST Do not call a "code blue"   In the event of cardiac or respiratory ARREST Do not perform Intubation, CPR, defibrillation or ACLS   In the event of cardiac or respiratory ARREST Use medication by any route, position, wound care, and other measures to relive pain and suffering. May use oxygen, suction and manual treatment of airway obstruction as needed for comfort.      03/18/15 1828      TOTAL  TIME TAKING CARE OF THIS PATIENT: 35 minutes.    Vaughan Basta M.D on 03/22/2015 at 9:12 AM  Between 7am to 6pm - Pager - [phone]  After 6pm go to [email or web address] - password [email or web address] [facility]  Office  [phone]  CC: Primary care physician; [name], MD   Note: This dictation was prepared with Dragon dictation along with smaller phrase technology. Any transcriptional errors that result from this process are unintentional.

## 2015-03-22 NOTE — Progress Notes (Signed)
Pt stable. IV removed. Report called to [facility] RN. Pt getting dressed. Family will be taking pt and discharge packet to [facility].

## 2015-03-27 DIAGNOSIS — S72009A Fracture of unspecified part of neck of unspecified femur, initial encounter for closed fracture: Secondary | ICD-10-CM | POA: Diagnosis not present

## 2015-03-27 DIAGNOSIS — N189 Chronic kidney disease, unspecified: Secondary | ICD-10-CM | POA: Diagnosis not present

## 2015-03-27 DIAGNOSIS — F329 Major depressive disorder, single episode, unspecified: Secondary | ICD-10-CM | POA: Diagnosis not present

## 2015-03-27 DIAGNOSIS — E039 Hypothyroidism, unspecified: Secondary | ICD-10-CM | POA: Diagnosis not present

## 2015-03-27 DIAGNOSIS — J45901 Unspecified asthma with (acute) exacerbation: Secondary | ICD-10-CM | POA: Diagnosis not present

## 2015-03-27 DIAGNOSIS — F0390 Unspecified dementia without behavioral disturbance: Secondary | ICD-10-CM | POA: Diagnosis not present

## 2015-03-27 DIAGNOSIS — I10 Essential (primary) hypertension: Secondary | ICD-10-CM | POA: Diagnosis not present

## 2015-03-27 DIAGNOSIS — F419 Anxiety disorder, unspecified: Secondary | ICD-10-CM | POA: Diagnosis not present

## 2015-03-27 DIAGNOSIS — K219 Gastro-esophageal reflux disease without esophagitis: Secondary | ICD-10-CM | POA: Diagnosis not present

## 2015-04-07 ENCOUNTER — Encounter: Payer: Self-pay | Admitting: Obstetrics and Gynecology

## 2015-04-07 ENCOUNTER — Encounter: Payer: Self-pay | Admitting: *Deleted

## 2015-04-07 ENCOUNTER — Ambulatory Visit: Payer: Self-pay | Admitting: Obstetrics and Gynecology

## 2015-04-07 ENCOUNTER — Ambulatory Visit (INDEPENDENT_AMBULATORY_CARE_PROVIDER_SITE_OTHER): Payer: Medicare Other | Admitting: Obstetrics and Gynecology

## 2015-04-07 ENCOUNTER — Ambulatory Visit: Payer: Self-pay

## 2015-04-07 VITALS — BP 130/73 | HR 60 | Resp 16 | Ht 64.0 in | Wt 165.0 lb

## 2015-04-07 DIAGNOSIS — N390 Urinary tract infection, site not specified: Secondary | ICD-10-CM

## 2015-04-07 MED ORDER — NITROFURANTOIN MONOHYD MACRO 100 MG PO CAPS: ORAL_CAPSULE | ORAL | Status: AC

## 2015-04-07 NOTE — Progress Notes (Signed)
In and Out Catheterization  Patient is present today for a I & O catheterization due to patient being unable to provide a specimen for analysis. Patient was cleaned and prepped in a sterile fashion with betadine and Lidocaine 2% jelly was instilled into the urethra.  A 14FR cath was inserted no complications were noted , 162ml of urine return was noted, urine was yellow in color. A clean urine sample was collected and sent for culture.  And catheter was removed with out difficulty.    Preformed by: Fonnie Jarvis, CMA

## 2015-04-07 NOTE — Progress Notes (Signed)
04/07/2015 9:15 AM   Christina Hahn 1923-02-25 [id number]  Referring provider: [name], MD [address] [place], [place] [address]  Chief Complaint  Patient presents with  . Urinary Tract Infection  . Establish Care    HPI: Patient is a 81 year old female presenting today with her two daughters with complaints of frequent urinary tract infections.  She was recently admitted for a femur fracture and was noted that at that time to have a urinary tract infection. She has since been discharged and is a resident at [facility] for rehabilitation.  Urine culture on 03/18/15  80,000 COLONIES/ml ESCHERICHIA COLI  50,000 COLONIES/mL ENTEROCOCCUS FAECALIS  MIXED BACTERIAL ORGANISMS    PMH: Past Medical History  Diagnosis Date  . Arthritis   . Hypertension   . GERD (gastroesophageal reflux disease)   . Depression   . Eczema   . Asthma   . Anxiety   . Dementia   . Thyroid disease   . Stroke ([place])   . Chronic kidney disease     Surgical History: Past Surgical History  Procedure Laterality Date  . Cholecystectomy    . Tonsillectomy    . Abdominal hysterectomy    . Breast lumpectomy    . Laminectomy    . Cataract extraction    . Intramedullary (im) nail intertrochanteric Right 01/09/2015    Procedure: INTRAMEDULLARY (IM) NAIL INTERTROCHANTRIC;  Surgeon: Earnestine Leys, MD;  Location: [facility] ORS;  Service: Orthopedics;  Laterality: Right;    Home Medications:    Medication List       This list is accurate as of: 04/07/15 11:59 PM.  Always use your most recent med list.               acetaminophen 325 MG tablet  Commonly known as:  TYLENOL  Take 650 mg by mouth every 4 (four) hours as needed for mild pain.     albuterol 108 (90 BASE) MCG/ACT inhaler  Commonly known as:  PROVENTIL HFA;VENTOLIN HFA  Inhale 2 puffs into the lungs every 8 (eight) hours as needed for shortness of breath.     albuterol (2.5 MG/3ML) 0.083% nebulizer solution  Commonly known as:   PROVENTIL  Take 2.5 mg by nebulization every 6 (six) hours as needed for shortness of breath.     aspirin 81 MG chewable tablet  Chew 81 mg by mouth daily.     Calcium Carbonate-Vitamin D 600-400 MG-UNIT tablet  Take 1 tablet by mouth daily.     cefUROXime 250 MG tablet  Commonly known as:  CEFTIN  Take 1 tablet (250 mg total) by mouth 2 (two) times daily with a meal.     citalopram 20 MG tablet  Commonly known as:  CELEXA  Take 20 mg by mouth 2 (two) times daily.     docusate sodium 100 MG capsule  Commonly known as:  COLACE  Take 1 capsule (100 mg total) by mouth 2 (two) times daily.     donepezil 5 MG tablet  Commonly known as:  ARICEPT  Take 5 mg by mouth daily.     erythromycin ophthalmic ointment  Place 1 application into the left eye 4 (four) times daily.     Fluticasone-Salmeterol 250-50 MCG/DOSE Aepb  Commonly known as:  ADVAIR  Inhale 1 puff into the lungs 2 (two) times daily.     lactose free nutrition Liqd  Take 237 mLs by mouth 2 (two) times daily as needed (when pt is not eating well).  feeding supplement (ENSURE ENLIVE) Liqd  Take 237 mLs by mouth 3 (three) times daily between meals.     levothyroxine 75 MCG tablet  Commonly known as:  SYNTHROID, LEVOTHROID  Take 75 mcg by mouth daily.     loratadine 10 MG tablet  Commonly known as:  CLARITIN  Take 10 mg by mouth daily.     magnesium hydroxide 400 MG/5ML suspension  Commonly known as:  MILK OF MAGNESIA  Take 30 mLs by mouth at bedtime.     montelukast 10 MG tablet  Commonly known as:  SINGULAIR  Take 10 mg by mouth daily.     nitrofurantoin (macrocrystal-monohydrate) 100 MG capsule  Commonly known as:  MACROBID  Once daily     OLANZapine 2.5 MG tablet  Commonly known as:  ZYPREXA  Take 1 tablet (2.5 mg total) by mouth at bedtime.     omeprazole 20 MG capsule  Commonly known as:  PRILOSEC  Take 20 mg by mouth 2 (two) times daily.     oxycodone 5 MG capsule  Commonly known as:  OXY-IR   Take 1 capsule (5 mg total) by mouth every 6 (six) hours as needed for pain.     phenazopyridine 100 MG tablet  Commonly known as:  PYRIDIUM  Take 100 mg by mouth.     SYSTANE OP  Apply 1 drop to eye 2 (two) times daily as needed (for dry eyes).     THEREMS Tabs  Take 1 tablet by mouth 2 (two) times daily.     traMADol 50 MG tablet  Commonly known as:  ULTRAM  Take 50 mg by mouth.     trospium 20 MG tablet  Commonly known as:  SANCTURA  Take 20 mg by mouth daily.        Allergies:  Allergies  Allergen Reactions  . Antivert [Meclizine] Other (See Comments)    Reaction:  Dizziness   . Codeine Other (See Comments)    Reaction:  Gives pt nightmares   . Dramamine [Dimenhydrinate] Other (See Comments)    Reaction:  Dizziness   . Flonase [Fluticasone Propionate] Other (See Comments)    Reaction:  Unknown   . Nsaids Other (See Comments)    Reaction:  Unknown   . Penicillins Other (See Comments)    Reaction:  Unknown   . Shrimp [Shellfish Allergy] Other (See Comments)    Reaction:  Unknown   . Tape Other (See Comments)    Reaction:  Tears pts skin   . Voltaren [Diclofenac Sodium] Other (See Comments)    Reaction:  Unknown     Family History: Family History  Problem Relation Age of Onset  . Pancreatic cancer Mother   . CAD Father   . Lung cancer Sister   . Lung cancer Brother     Social History:  reports that she has never smoked. She does not have any smokeless tobacco history on file. She reports that she does not drink alcohol. Her drug history is not on file.  ROS: UROLOGY Frequent Urination?: Yes Hard to postpone urination?: Yes Burning/pain with urination?: No Get up at night to urinate?: No Leakage of urine?: Yes Urine stream starts and stops?: No Trouble starting stream?: No Do you have to strain to urinate?: No Blood in urine?: No Urinary tract infection?: Yes Sexually transmitted disease?: No Injury to kidneys or bladder?: No Painful  intercourse?: No Weak stream?: No Currently pregnant?: No Vaginal bleeding?: No Last menstrual period?: n  Gastrointestinal Nausea?:  No Vomiting?: No Indigestion/heartburn?: No Diarrhea?: No Constipation?: No  Constitutional Fever: No Night sweats?: No Weight loss?: No Fatigue?: No  Skin Skin rash/lesions?: No Itching?: No  Eyes Blurred vision?: No Double vision?: No  Ears/Nose/Throat Sore throat?: No Sinus problems?: No  Hematologic/Lymphatic Swollen glands?: No Easy bruising?: Yes  Cardiovascular Leg swelling?: No Chest pain?: No  Respiratory Cough?: No Shortness of breath?: No  Endocrine Excessive thirst?: No  Musculoskeletal Back pain?: No Joint pain?: No  Neurological Headaches?: No Dizziness?: No  Psychologic Depression?: No Anxiety?: No  Physical Exam: BP 130/73 mmHg  Pulse 60  Resp 16  Ht 5\' 4"  (1.626 m)  Wt 165 lb (74.844 kg)  BMI 28.31 kg/m2  Constitutional:  Alert and oriented, No acute distress, wheelchair bound HEENT: [place] AT, moist mucus membranes.  Trachea midline, no masses. Cardiovascular: No clubbing, cyanosis, or edema. Respiratory: Normal respiratory effort, no increased work of breathing. GI: Abdomen is soft, nontender, nondistended, no abdominal masses GU: No CVA tenderness.  Skin: No rashes, bruises or suspicious lesions. Lymph: No cervical or inguinal adenopathy. Neurologic: Grossly intact, no focal deficits, moving all 4 extremities. Psychiatric: Normal mood and affect.  Laboratory Data:   Urinalysis    Component Value Date/Time   COLORURINE YELLOW* 03/18/2015 1607   COLORURINE Yellow 10/20/2013 1732   APPEARANCEUR CLOUDY* 03/18/2015 1607   APPEARANCEUR Hazy 10/20/2013 1732   LABSPEC 1.006 03/18/2015 1607   LABSPEC 1.016 10/20/2013 1732   PHURINE 6.0 03/18/2015 1607   PHURINE 6.0 10/20/2013 1732   GLUCOSEU NEGATIVE 03/18/2015 1607   GLUCOSEU Negative 10/20/2013 1732   HGBUR 1+* 03/18/2015 1607   HGBUR  Negative 10/20/2013 1732   BILIRUBINUR NEGATIVE 03/18/2015 1607   BILIRUBINUR Negative 10/20/2013 1732   KETONESUR NEGATIVE 03/18/2015 1607   KETONESUR Negative 10/20/2013 1732   PROTEINUR NEGATIVE 03/18/2015 1607   PROTEINUR 30 mg/dL 10/20/2013 1732   NITRITE NEGATIVE 03/18/2015 1607   NITRITE Negative 10/20/2013 1732   LEUKOCYTESUR 3+* 03/18/2015 1607   LEUKOCYTESUR 1+ 10/20/2013 1732    Pertinent Imaging:   Assessment & Plan:    1. UTI (lower urinary tract infection-  Cath specimen obtained. Sent for culture. Will start patient on suppressive antibiotic therapy. UTI prevention strategies discussed.  Good perineal hygiene reviewed. Patient is encouraged to increase daily water intake, start cranberry supplements to prevent invasive colonization along the urinary tract and probiotics, especially lactobacillus to restore normal vaginal flora. - Urinalysis, Complete  Return in about 3 months (around 07/08/2015) for recurrent UTI.  These notes generated with voice recognition software. I apologize for typographical errors.  Herbert Moors, [facility] [address], [place], [place] [address] [phone]

## 2015-04-08 DIAGNOSIS — E039 Hypothyroidism, unspecified: Secondary | ICD-10-CM | POA: Diagnosis not present

## 2015-04-08 DIAGNOSIS — J45901 Unspecified asthma with (acute) exacerbation: Secondary | ICD-10-CM | POA: Diagnosis not present

## 2015-04-08 DIAGNOSIS — I10 Essential (primary) hypertension: Secondary | ICD-10-CM | POA: Diagnosis not present

## 2015-04-08 DIAGNOSIS — K219 Gastro-esophageal reflux disease without esophagitis: Secondary | ICD-10-CM | POA: Diagnosis not present

## 2015-04-08 DIAGNOSIS — F0390 Unspecified dementia without behavioral disturbance: Secondary | ICD-10-CM | POA: Diagnosis not present

## 2015-04-08 DIAGNOSIS — D649 Anemia, unspecified: Secondary | ICD-10-CM | POA: Diagnosis not present

## 2015-04-08 LAB — MICROSCOPIC EXAMINATION
RBC MICROSCOPIC, UA: NONE SEEN /HPF (ref 0–?)
Renal Epithel, UA: NONE SEEN /hpf

## 2015-04-08 LAB — URINALYSIS, COMPLETE

## 2015-04-09 DIAGNOSIS — I10 Essential (primary) hypertension: Secondary | ICD-10-CM | POA: Diagnosis not present

## 2015-04-09 DIAGNOSIS — N183 Chronic kidney disease, stage 3 (moderate): Secondary | ICD-10-CM | POA: Diagnosis not present

## 2015-04-09 DIAGNOSIS — E559 Vitamin D deficiency, unspecified: Secondary | ICD-10-CM | POA: Diagnosis not present

## 2015-04-09 DIAGNOSIS — R600 Localized edema: Secondary | ICD-10-CM | POA: Diagnosis not present

## 2015-04-11 LAB — CULTURE, URINE COMPREHENSIVE

## 2015-04-12 ENCOUNTER — Telehealth: Payer: Self-pay

## 2015-04-12 NOTE — Telephone Encounter (Signed)
LMOM-Angela

## 2015-04-12 NOTE — Telephone Encounter (Signed)
-----   Message from Christina Hahn, [place] sent at 04/12/2015 11:05 AM EDT ----- Please notify patient and her daughters that her urine culture did come back positive for infection. She should be currently taking suppressive 1 daily Macrobid. I would like her treated with Macrobid 100mg  twice daily for 1 week and then she can resume suppressive therapy again. We please send a prescription for this. Thank you

## 2015-04-13 NOTE — Telephone Encounter (Signed)
Spoke with pt daughter, [name], in reference to pt infection. [name] stated she would have the home call me. Nurse was not able to get in touch with anyone at pt facility.

## 2015-04-13 NOTE — Telephone Encounter (Signed)
Spoke with DeeDee and made aware of macrobid regimen. DeeDee voiced understanding. Orders faxed.

## 2015-04-13 NOTE — Telephone Encounter (Signed)
Returned Christina Hahn's call from [facility]. Office was not able to get in touch with Christina Hahn. Office stated they would have Christina Hahn return the call.

## 2015-04-14 DIAGNOSIS — M19131 Post-traumatic osteoarthritis, right wrist: Secondary | ICD-10-CM | POA: Diagnosis not present

## 2015-04-14 DIAGNOSIS — S72141D Displaced intertrochanteric fracture of right femur, subsequent encounter for closed fracture with routine healing: Secondary | ICD-10-CM | POA: Diagnosis not present

## 2015-04-15 DIAGNOSIS — J45901 Unspecified asthma with (acute) exacerbation: Secondary | ICD-10-CM | POA: Diagnosis not present

## 2015-04-15 DIAGNOSIS — E039 Hypothyroidism, unspecified: Secondary | ICD-10-CM | POA: Diagnosis not present

## 2015-04-15 DIAGNOSIS — S72009A Fracture of unspecified part of neck of unspecified femur, initial encounter for closed fracture: Secondary | ICD-10-CM | POA: Diagnosis not present

## 2015-04-15 DIAGNOSIS — D649 Anemia, unspecified: Secondary | ICD-10-CM | POA: Diagnosis not present

## 2015-04-15 DIAGNOSIS — F329 Major depressive disorder, single episode, unspecified: Secondary | ICD-10-CM | POA: Diagnosis not present

## 2015-04-15 DIAGNOSIS — I10 Essential (primary) hypertension: Secondary | ICD-10-CM | POA: Diagnosis not present

## 2015-04-15 DIAGNOSIS — K219 Gastro-esophageal reflux disease without esophagitis: Secondary | ICD-10-CM | POA: Diagnosis not present

## 2015-04-15 DIAGNOSIS — F0390 Unspecified dementia without behavioral disturbance: Secondary | ICD-10-CM | POA: Diagnosis not present

## 2015-04-15 DIAGNOSIS — F419 Anxiety disorder, unspecified: Secondary | ICD-10-CM | POA: Diagnosis not present

## 2015-04-20 DIAGNOSIS — N390 Urinary tract infection, site not specified: Secondary | ICD-10-CM | POA: Diagnosis not present

## 2015-04-20 DIAGNOSIS — K219 Gastro-esophageal reflux disease without esophagitis: Secondary | ICD-10-CM | POA: Diagnosis not present

## 2015-04-20 DIAGNOSIS — D649 Anemia, unspecified: Secondary | ICD-10-CM | POA: Diagnosis not present

## 2015-04-20 DIAGNOSIS — Z5189 Encounter for other specified aftercare: Secondary | ICD-10-CM | POA: Diagnosis not present

## 2015-04-20 DIAGNOSIS — D510 Vitamin B12 deficiency anemia due to intrinsic factor deficiency: Secondary | ICD-10-CM | POA: Diagnosis not present

## 2015-04-20 DIAGNOSIS — E568 Deficiency of other vitamins: Secondary | ICD-10-CM | POA: Diagnosis not present

## 2015-04-20 DIAGNOSIS — I10 Essential (primary) hypertension: Secondary | ICD-10-CM | POA: Diagnosis not present

## 2015-04-29 DIAGNOSIS — D649 Anemia, unspecified: Secondary | ICD-10-CM | POA: Diagnosis not present

## 2015-04-29 DIAGNOSIS — I10 Essential (primary) hypertension: Secondary | ICD-10-CM | POA: Diagnosis not present

## 2015-04-29 DIAGNOSIS — F0390 Unspecified dementia without behavioral disturbance: Secondary | ICD-10-CM | POA: Diagnosis not present

## 2015-04-29 DIAGNOSIS — E039 Hypothyroidism, unspecified: Secondary | ICD-10-CM | POA: Diagnosis not present

## 2015-04-29 DIAGNOSIS — J45901 Unspecified asthma with (acute) exacerbation: Secondary | ICD-10-CM | POA: Diagnosis not present

## 2015-04-29 DIAGNOSIS — M25559 Pain in unspecified hip: Secondary | ICD-10-CM | POA: Diagnosis not present

## 2015-05-09 IMAGING — CR DG SHOULDER 3+V*R*
1 series · 5 of 5 positions shown · non-contrast
Comparison: None.

CLINICAL DATA: Fall.  Right shoulder pain.

EXAM:
DG SHOULDER 3+ VIEWS RIGHT

[Series 1: t shoulder grashey right · 0.14mm/px · 5 of 5 slices shown]
[im 1/5]
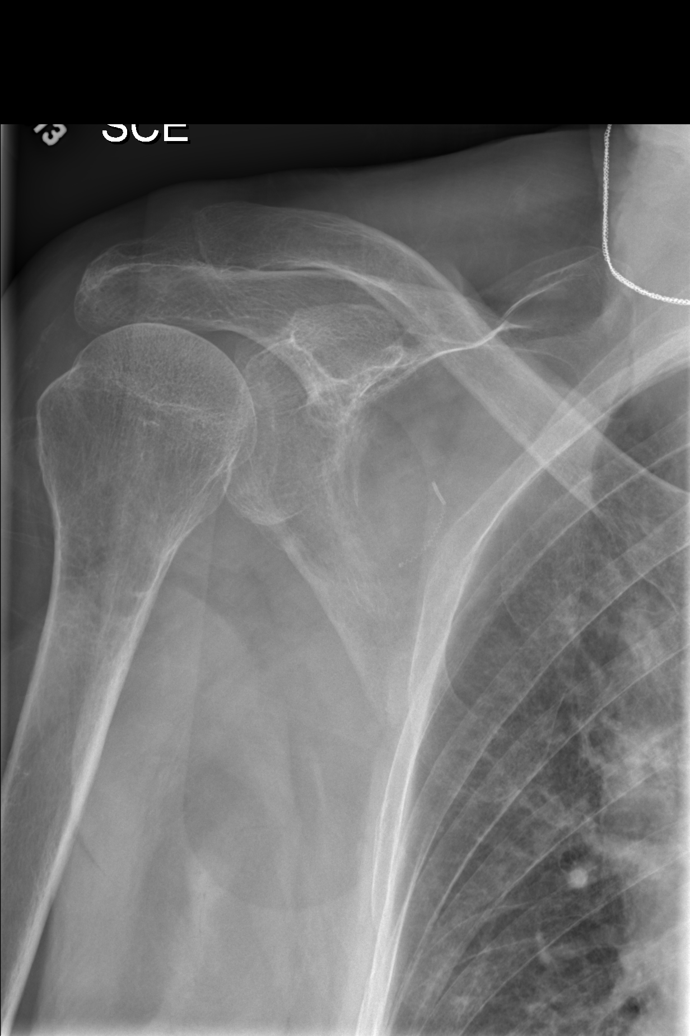
[im 2/5]
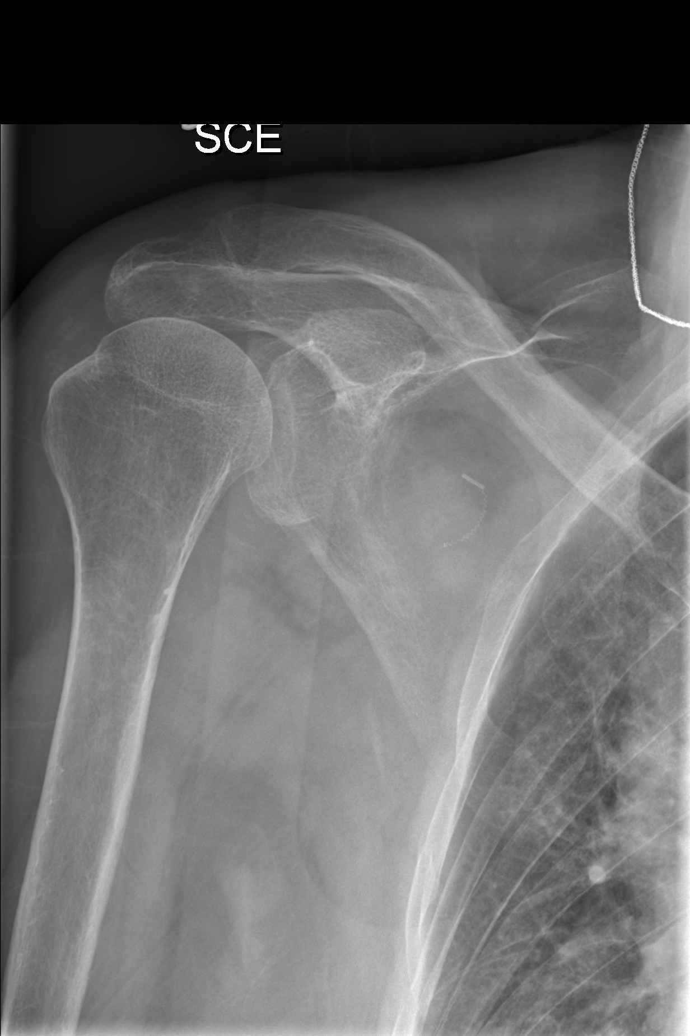
[im 3/5]
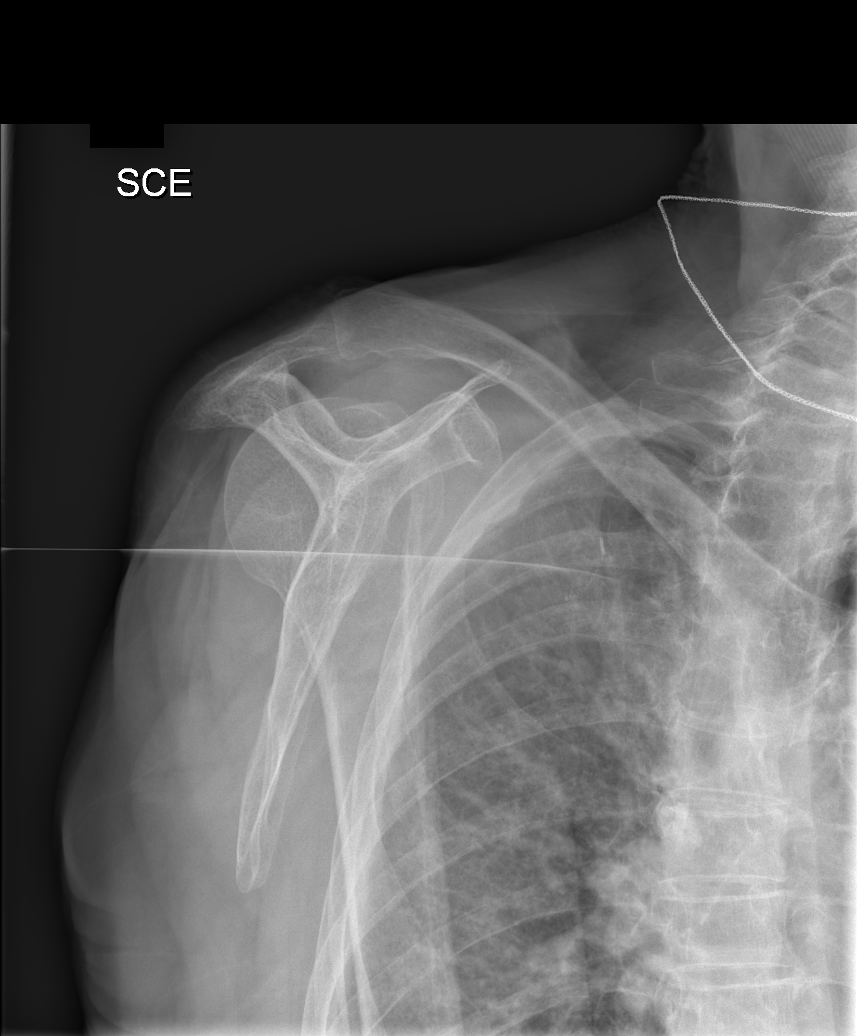
[im 4/5]
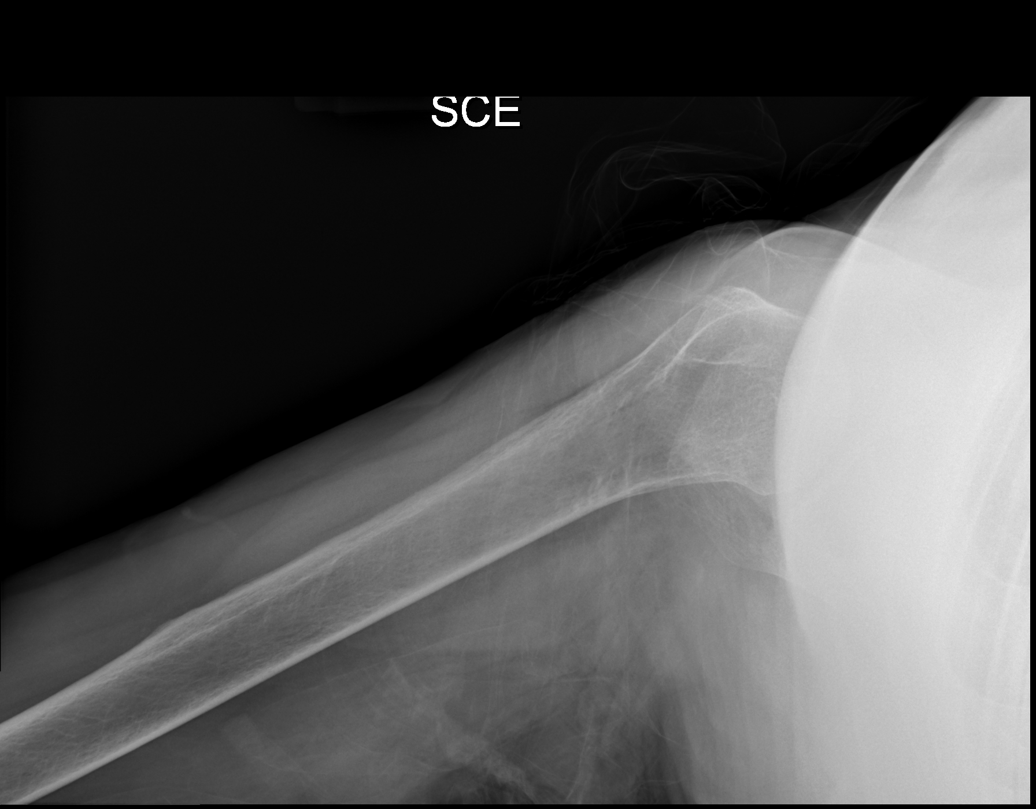
[im 5/5]
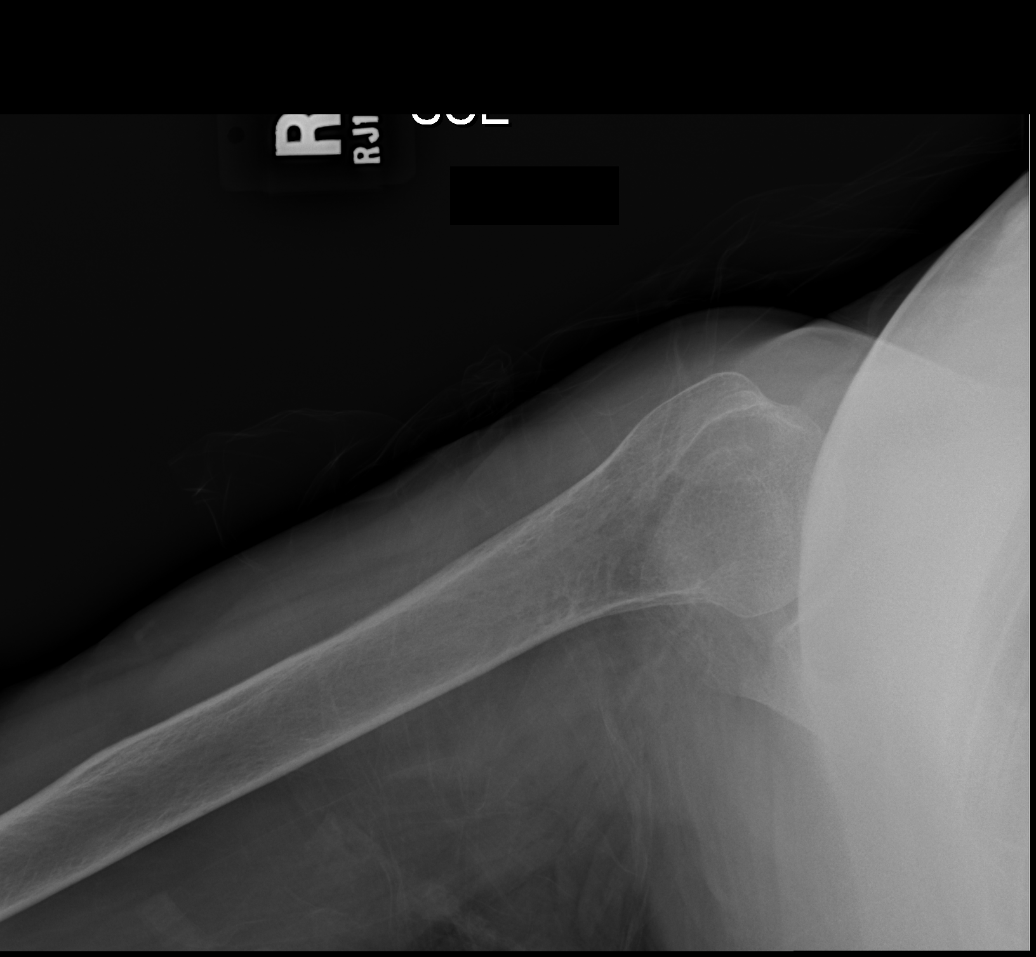

[5 of 5 positions shown; findings below may reference images not displayed]

FINDINGS: There is no evidence of fracture or dislocation. Mild
acromioclavicular degenerative changes noted. Generalized osteopenia
also demonstrated.
IMPRESSION: No acute findings.

## 2015-05-09 IMAGING — CR DG WRIST COMPLETE 3+V*R*
1 series · 3 of 3 positions shown · non-contrast
Comparison: None.

CLINICAL DATA: Fall.  Wrist swelling.

EXAM:
RIGHT WRIST - COMPLETE 3+ VIEW

[Series 1: x wrist pa right · 0.14mm/px · 3 of 3 slices shown]
[im 1/3]
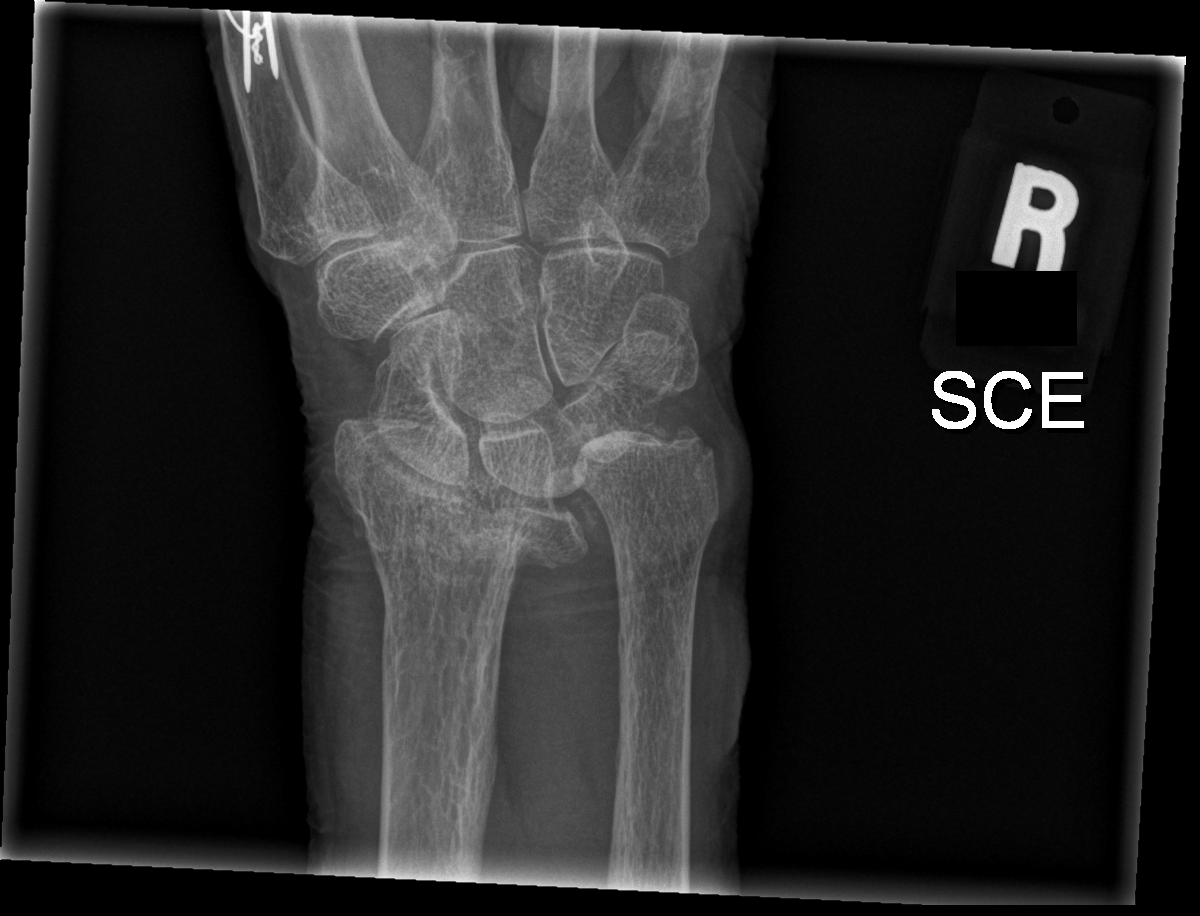
[im 2/3]
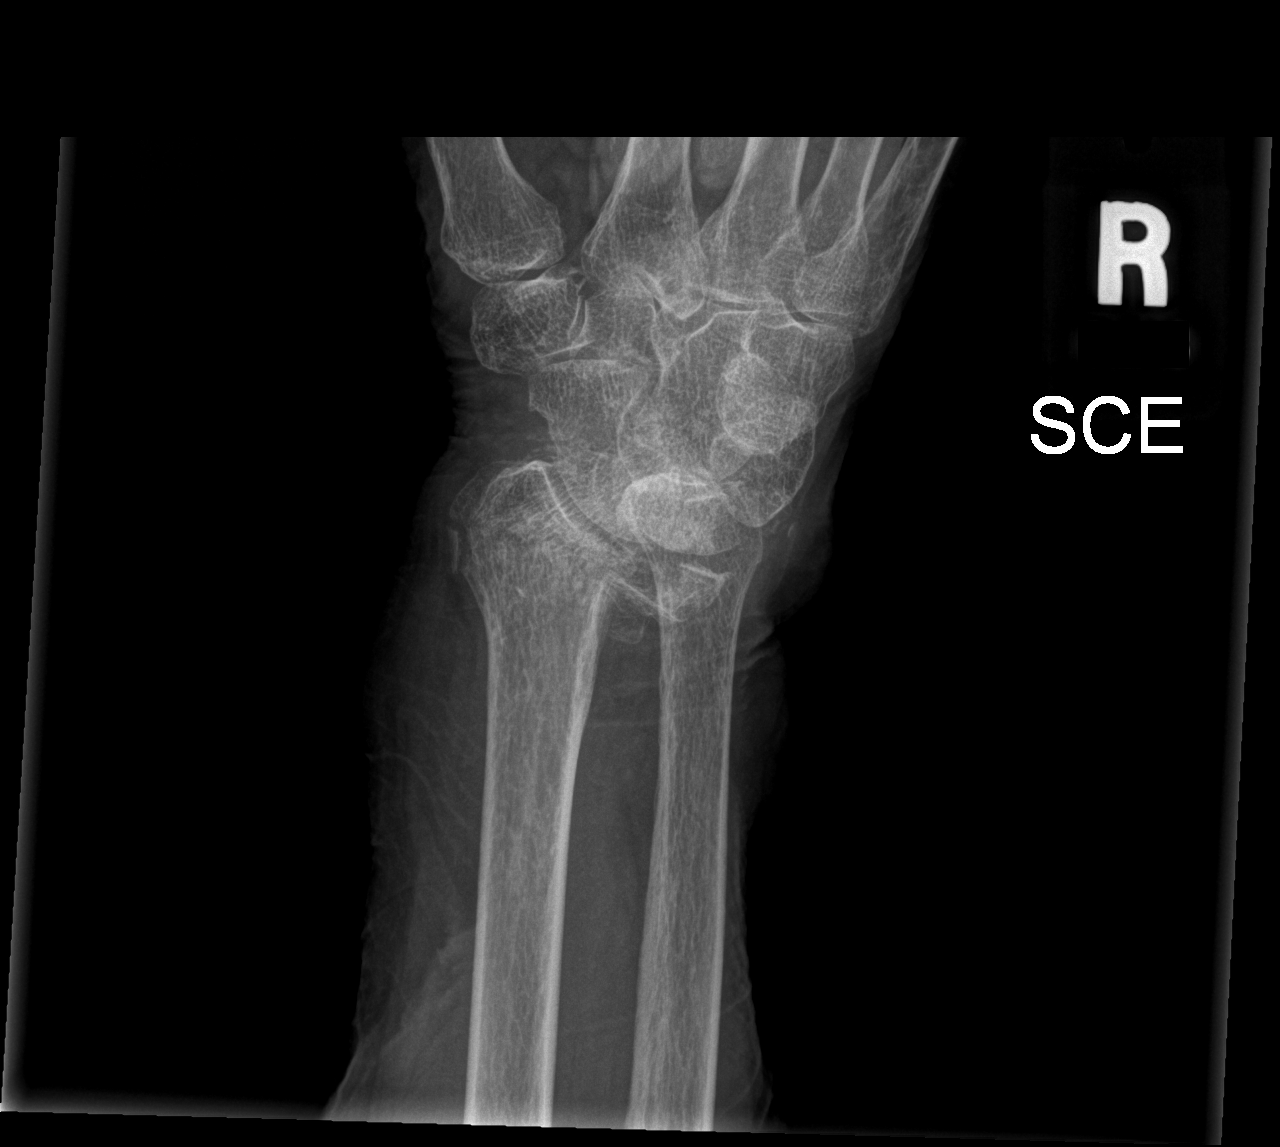
[im 3/3]
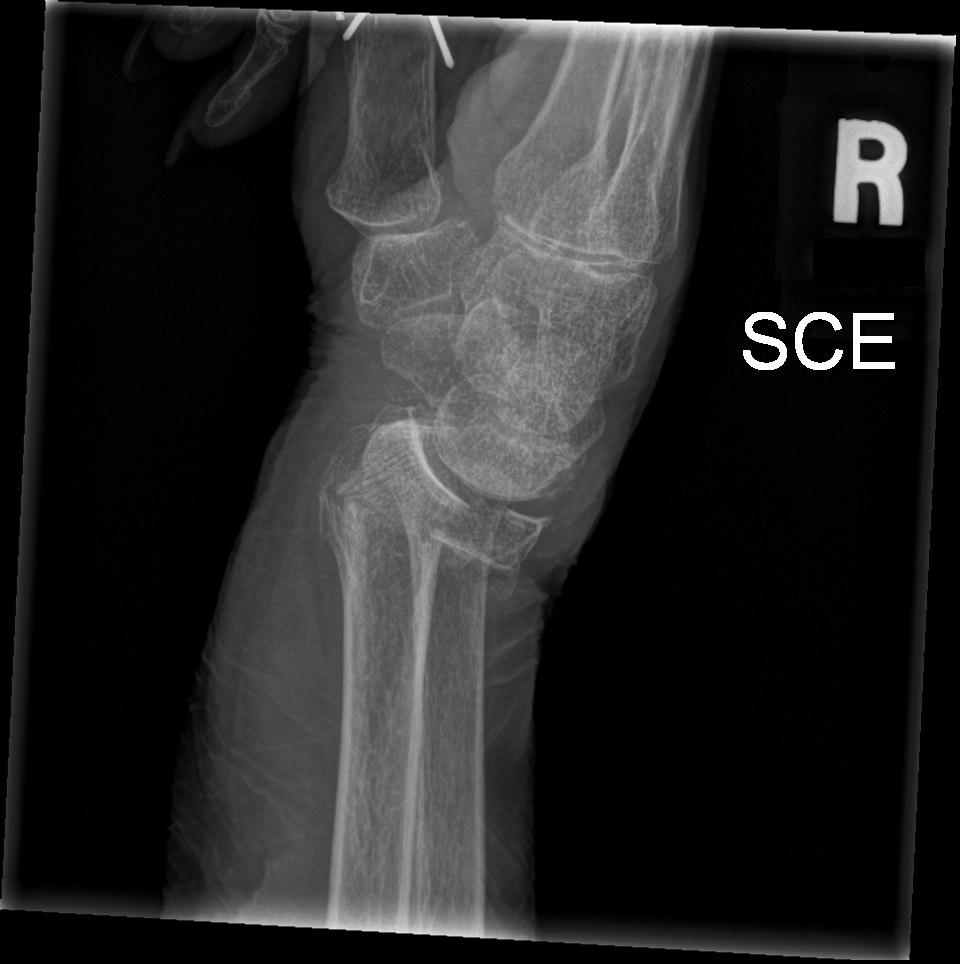

[3 of 3 positions shown; findings below may reference images not displayed]

FINDINGS: Moderate osteopenia is present. A comminuted distal radius fracture
demonstrates significant dorsal angulation of 30-40 degrees. The
fracture extends to the articular surface. An ulnar styloid fracture
is present as well. The carpal bones are intact.
IMPRESSION: 1. Comminuted distal radius fracture with probable intra-articular
extension and significant dorsal angulation as described.
2. Ulnar styloid fracture.
3. Moderate osteopenia.

## 2015-05-10 DIAGNOSIS — M79671 Pain in right foot: Secondary | ICD-10-CM | POA: Diagnosis not present

## 2015-05-10 DIAGNOSIS — M79672 Pain in left foot: Secondary | ICD-10-CM | POA: Diagnosis not present

## 2015-05-10 DIAGNOSIS — B351 Tinea unguium: Secondary | ICD-10-CM | POA: Diagnosis not present

## 2015-05-11 DIAGNOSIS — D649 Anemia, unspecified: Secondary | ICD-10-CM | POA: Diagnosis not present

## 2015-05-11 DIAGNOSIS — I10 Essential (primary) hypertension: Secondary | ICD-10-CM | POA: Diagnosis not present

## 2015-05-11 DIAGNOSIS — K219 Gastro-esophageal reflux disease without esophagitis: Secondary | ICD-10-CM | POA: Diagnosis not present

## 2015-05-11 DIAGNOSIS — F0390 Unspecified dementia without behavioral disturbance: Secondary | ICD-10-CM | POA: Diagnosis not present

## 2015-05-11 DIAGNOSIS — F419 Anxiety disorder, unspecified: Secondary | ICD-10-CM | POA: Diagnosis not present

## 2015-05-11 DIAGNOSIS — E039 Hypothyroidism, unspecified: Secondary | ICD-10-CM | POA: Diagnosis not present

## 2015-05-11 DIAGNOSIS — I6350 Cerebral infarction due to unspecified occlusion or stenosis of unspecified cerebral artery: Secondary | ICD-10-CM | POA: Diagnosis not present

## 2015-05-11 DIAGNOSIS — S72009A Fracture of unspecified part of neck of unspecified femur, initial encounter for closed fracture: Secondary | ICD-10-CM | POA: Diagnosis not present

## 2015-05-11 DIAGNOSIS — F329 Major depressive disorder, single episode, unspecified: Secondary | ICD-10-CM | POA: Diagnosis not present

## 2015-05-19 DIAGNOSIS — F419 Anxiety disorder, unspecified: Secondary | ICD-10-CM | POA: Diagnosis not present

## 2015-05-19 DIAGNOSIS — E538 Deficiency of other specified B group vitamins: Secondary | ICD-10-CM | POA: Diagnosis not present

## 2015-05-19 DIAGNOSIS — F330 Major depressive disorder, recurrent, mild: Secondary | ICD-10-CM | POA: Diagnosis not present

## 2015-05-19 DIAGNOSIS — I6930 Unspecified sequelae of cerebral infarction: Secondary | ICD-10-CM | POA: Diagnosis not present

## 2015-05-20 DIAGNOSIS — D510 Vitamin B12 deficiency anemia due to intrinsic factor deficiency: Secondary | ICD-10-CM | POA: Diagnosis not present

## 2015-05-20 DIAGNOSIS — D649 Anemia, unspecified: Secondary | ICD-10-CM | POA: Diagnosis not present

## 2015-05-25 DIAGNOSIS — I10 Essential (primary) hypertension: Secondary | ICD-10-CM | POA: Diagnosis not present

## 2015-05-25 DIAGNOSIS — D649 Anemia, unspecified: Secondary | ICD-10-CM | POA: Diagnosis not present

## 2015-05-25 DIAGNOSIS — E039 Hypothyroidism, unspecified: Secondary | ICD-10-CM | POA: Diagnosis not present

## 2015-05-25 DIAGNOSIS — F0390 Unspecified dementia without behavioral disturbance: Secondary | ICD-10-CM | POA: Diagnosis not present

## 2015-05-25 DIAGNOSIS — E538 Deficiency of other specified B group vitamins: Secondary | ICD-10-CM | POA: Diagnosis not present

## 2015-05-25 DIAGNOSIS — J45901 Unspecified asthma with (acute) exacerbation: Secondary | ICD-10-CM | POA: Diagnosis not present

## 2015-05-25 DIAGNOSIS — K219 Gastro-esophageal reflux disease without esophagitis: Secondary | ICD-10-CM | POA: Diagnosis not present

## 2015-05-25 DIAGNOSIS — F330 Major depressive disorder, recurrent, mild: Secondary | ICD-10-CM | POA: Diagnosis not present

## 2015-05-25 DIAGNOSIS — I6930 Unspecified sequelae of cerebral infarction: Secondary | ICD-10-CM | POA: Diagnosis not present

## 2015-05-25 DIAGNOSIS — K5900 Constipation, unspecified: Secondary | ICD-10-CM | POA: Diagnosis not present

## 2015-05-25 DIAGNOSIS — F419 Anxiety disorder, unspecified: Secondary | ICD-10-CM | POA: Diagnosis not present

## 2015-06-02 DIAGNOSIS — D649 Anemia, unspecified: Secondary | ICD-10-CM | POA: Diagnosis not present

## 2015-06-02 DIAGNOSIS — E568 Deficiency of other vitamins: Secondary | ICD-10-CM | POA: Diagnosis not present

## 2015-06-02 DIAGNOSIS — N390 Urinary tract infection, site not specified: Secondary | ICD-10-CM | POA: Diagnosis not present

## 2015-06-08 DIAGNOSIS — S72009A Fracture of unspecified part of neck of unspecified femur, initial encounter for closed fracture: Secondary | ICD-10-CM | POA: Diagnosis not present

## 2015-06-08 DIAGNOSIS — J45901 Unspecified asthma with (acute) exacerbation: Secondary | ICD-10-CM | POA: Diagnosis not present

## 2015-06-08 DIAGNOSIS — F329 Major depressive disorder, single episode, unspecified: Secondary | ICD-10-CM | POA: Diagnosis not present

## 2015-06-08 DIAGNOSIS — I10 Essential (primary) hypertension: Secondary | ICD-10-CM | POA: Diagnosis not present

## 2015-06-08 DIAGNOSIS — I6350 Cerebral infarction due to unspecified occlusion or stenosis of unspecified cerebral artery: Secondary | ICD-10-CM | POA: Diagnosis not present

## 2015-06-08 DIAGNOSIS — E039 Hypothyroidism, unspecified: Secondary | ICD-10-CM | POA: Diagnosis not present

## 2015-06-08 DIAGNOSIS — F419 Anxiety disorder, unspecified: Secondary | ICD-10-CM | POA: Diagnosis not present

## 2015-06-12 DIAGNOSIS — I10 Essential (primary) hypertension: Secondary | ICD-10-CM | POA: Diagnosis not present

## 2015-06-12 DIAGNOSIS — D649 Anemia, unspecified: Secondary | ICD-10-CM | POA: Diagnosis not present

## 2015-06-12 DIAGNOSIS — N390 Urinary tract infection, site not specified: Secondary | ICD-10-CM | POA: Diagnosis not present

## 2015-06-12 DIAGNOSIS — D510 Vitamin B12 deficiency anemia due to intrinsic factor deficiency: Secondary | ICD-10-CM | POA: Diagnosis not present

## 2015-06-16 DIAGNOSIS — F419 Anxiety disorder, unspecified: Secondary | ICD-10-CM | POA: Diagnosis not present

## 2015-06-16 DIAGNOSIS — B309 Viral conjunctivitis, unspecified: Secondary | ICD-10-CM | POA: Diagnosis not present

## 2015-06-16 DIAGNOSIS — I6930 Unspecified sequelae of cerebral infarction: Secondary | ICD-10-CM | POA: Diagnosis not present

## 2015-06-16 DIAGNOSIS — R41841 Cognitive communication deficit: Secondary | ICD-10-CM | POA: Diagnosis not present

## 2015-06-16 DIAGNOSIS — I2510 Atherosclerotic heart disease of native coronary artery without angina pectoris: Secondary | ICD-10-CM | POA: Diagnosis not present

## 2015-06-16 DIAGNOSIS — M6281 Muscle weakness (generalized): Secondary | ICD-10-CM | POA: Diagnosis not present

## 2015-06-16 DIAGNOSIS — N3281 Overactive bladder: Secondary | ICD-10-CM | POA: Diagnosis not present

## 2015-06-16 DIAGNOSIS — E538 Deficiency of other specified B group vitamins: Secondary | ICD-10-CM | POA: Diagnosis not present

## 2015-06-16 DIAGNOSIS — E038 Other specified hypothyroidism: Secondary | ICD-10-CM | POA: Diagnosis not present

## 2015-06-16 DIAGNOSIS — F330 Major depressive disorder, recurrent, mild: Secondary | ICD-10-CM | POA: Diagnosis not present

## 2015-06-17 DIAGNOSIS — B309 Viral conjunctivitis, unspecified: Secondary | ICD-10-CM | POA: Diagnosis not present

## 2015-06-17 DIAGNOSIS — M6281 Muscle weakness (generalized): Secondary | ICD-10-CM | POA: Diagnosis not present

## 2015-06-17 DIAGNOSIS — I2510 Atherosclerotic heart disease of native coronary artery without angina pectoris: Secondary | ICD-10-CM | POA: Diagnosis not present

## 2015-06-17 DIAGNOSIS — N3281 Overactive bladder: Secondary | ICD-10-CM | POA: Diagnosis not present

## 2015-06-17 DIAGNOSIS — R41841 Cognitive communication deficit: Secondary | ICD-10-CM | POA: Diagnosis not present

## 2015-06-17 DIAGNOSIS — E038 Other specified hypothyroidism: Secondary | ICD-10-CM | POA: Diagnosis not present

## 2015-06-18 DIAGNOSIS — B309 Viral conjunctivitis, unspecified: Secondary | ICD-10-CM | POA: Diagnosis not present

## 2015-06-18 DIAGNOSIS — M6281 Muscle weakness (generalized): Secondary | ICD-10-CM | POA: Diagnosis not present

## 2015-06-18 DIAGNOSIS — I2510 Atherosclerotic heart disease of native coronary artery without angina pectoris: Secondary | ICD-10-CM | POA: Diagnosis not present

## 2015-06-18 DIAGNOSIS — R41841 Cognitive communication deficit: Secondary | ICD-10-CM | POA: Diagnosis not present

## 2015-06-18 DIAGNOSIS — E038 Other specified hypothyroidism: Secondary | ICD-10-CM | POA: Diagnosis not present

## 2015-06-18 DIAGNOSIS — N3281 Overactive bladder: Secondary | ICD-10-CM | POA: Diagnosis not present

## 2015-06-19 DIAGNOSIS — M6281 Muscle weakness (generalized): Secondary | ICD-10-CM | POA: Diagnosis not present

## 2015-06-19 DIAGNOSIS — N3281 Overactive bladder: Secondary | ICD-10-CM | POA: Diagnosis not present

## 2015-06-19 DIAGNOSIS — B309 Viral conjunctivitis, unspecified: Secondary | ICD-10-CM | POA: Diagnosis not present

## 2015-06-19 DIAGNOSIS — E038 Other specified hypothyroidism: Secondary | ICD-10-CM | POA: Diagnosis not present

## 2015-06-19 DIAGNOSIS — I2510 Atherosclerotic heart disease of native coronary artery without angina pectoris: Secondary | ICD-10-CM | POA: Diagnosis not present

## 2015-06-19 DIAGNOSIS — R41841 Cognitive communication deficit: Secondary | ICD-10-CM | POA: Diagnosis not present

## 2015-06-21 DIAGNOSIS — E038 Other specified hypothyroidism: Secondary | ICD-10-CM | POA: Diagnosis not present

## 2015-06-21 DIAGNOSIS — I2510 Atherosclerotic heart disease of native coronary artery without angina pectoris: Secondary | ICD-10-CM | POA: Diagnosis not present

## 2015-06-21 DIAGNOSIS — M6281 Muscle weakness (generalized): Secondary | ICD-10-CM | POA: Diagnosis not present

## 2015-06-21 DIAGNOSIS — N3281 Overactive bladder: Secondary | ICD-10-CM | POA: Diagnosis not present

## 2015-06-21 DIAGNOSIS — R41841 Cognitive communication deficit: Secondary | ICD-10-CM | POA: Diagnosis not present

## 2015-06-21 DIAGNOSIS — B309 Viral conjunctivitis, unspecified: Secondary | ICD-10-CM | POA: Diagnosis not present

## 2015-06-23 DIAGNOSIS — M6281 Muscle weakness (generalized): Secondary | ICD-10-CM | POA: Diagnosis not present

## 2015-06-23 DIAGNOSIS — B309 Viral conjunctivitis, unspecified: Secondary | ICD-10-CM | POA: Diagnosis not present

## 2015-06-23 DIAGNOSIS — R41841 Cognitive communication deficit: Secondary | ICD-10-CM | POA: Diagnosis not present

## 2015-06-23 DIAGNOSIS — E038 Other specified hypothyroidism: Secondary | ICD-10-CM | POA: Diagnosis not present

## 2015-06-23 DIAGNOSIS — I2510 Atherosclerotic heart disease of native coronary artery without angina pectoris: Secondary | ICD-10-CM | POA: Diagnosis not present

## 2015-06-23 DIAGNOSIS — N3281 Overactive bladder: Secondary | ICD-10-CM | POA: Diagnosis not present

## 2015-06-25 DIAGNOSIS — I2510 Atherosclerotic heart disease of native coronary artery without angina pectoris: Secondary | ICD-10-CM | POA: Diagnosis not present

## 2015-06-25 DIAGNOSIS — R41841 Cognitive communication deficit: Secondary | ICD-10-CM | POA: Diagnosis not present

## 2015-06-25 DIAGNOSIS — E038 Other specified hypothyroidism: Secondary | ICD-10-CM | POA: Diagnosis not present

## 2015-06-25 DIAGNOSIS — M6281 Muscle weakness (generalized): Secondary | ICD-10-CM | POA: Diagnosis not present

## 2015-06-25 DIAGNOSIS — B309 Viral conjunctivitis, unspecified: Secondary | ICD-10-CM | POA: Diagnosis not present

## 2015-06-25 DIAGNOSIS — N3281 Overactive bladder: Secondary | ICD-10-CM | POA: Diagnosis not present

## 2015-06-29 DIAGNOSIS — R41841 Cognitive communication deficit: Secondary | ICD-10-CM | POA: Diagnosis not present

## 2015-06-29 DIAGNOSIS — N3281 Overactive bladder: Secondary | ICD-10-CM | POA: Diagnosis not present

## 2015-06-29 DIAGNOSIS — E038 Other specified hypothyroidism: Secondary | ICD-10-CM | POA: Diagnosis not present

## 2015-06-29 DIAGNOSIS — B309 Viral conjunctivitis, unspecified: Secondary | ICD-10-CM | POA: Diagnosis not present

## 2015-06-29 DIAGNOSIS — M6281 Muscle weakness (generalized): Secondary | ICD-10-CM | POA: Diagnosis not present

## 2015-06-29 DIAGNOSIS — I2510 Atherosclerotic heart disease of native coronary artery without angina pectoris: Secondary | ICD-10-CM | POA: Diagnosis not present

## 2015-06-30 DIAGNOSIS — F0390 Unspecified dementia without behavioral disturbance: Secondary | ICD-10-CM | POA: Diagnosis not present

## 2015-06-30 DIAGNOSIS — M25559 Pain in unspecified hip: Secondary | ICD-10-CM | POA: Diagnosis not present

## 2015-06-30 DIAGNOSIS — I10 Essential (primary) hypertension: Secondary | ICD-10-CM | POA: Diagnosis not present

## 2015-06-30 DIAGNOSIS — M109 Gout, unspecified: Secondary | ICD-10-CM | POA: Diagnosis not present

## 2015-06-30 DIAGNOSIS — J45901 Unspecified asthma with (acute) exacerbation: Secondary | ICD-10-CM | POA: Diagnosis not present

## 2015-06-30 DIAGNOSIS — E039 Hypothyroidism, unspecified: Secondary | ICD-10-CM | POA: Diagnosis not present

## 2015-06-30 DIAGNOSIS — K219 Gastro-esophageal reflux disease without esophagitis: Secondary | ICD-10-CM | POA: Diagnosis not present

## 2015-06-30 DIAGNOSIS — K5900 Constipation, unspecified: Secondary | ICD-10-CM | POA: Diagnosis not present

## 2015-06-30 DIAGNOSIS — D649 Anemia, unspecified: Secondary | ICD-10-CM | POA: Diagnosis not present

## 2015-07-08 ENCOUNTER — Ambulatory Visit (INDEPENDENT_AMBULATORY_CARE_PROVIDER_SITE_OTHER): Payer: Medicare Other | Admitting: Obstetrics and Gynecology

## 2015-07-08 ENCOUNTER — Encounter: Payer: Self-pay | Admitting: Obstetrics and Gynecology

## 2015-07-08 VITALS — BP 113/66 | HR 66 | Temp 98.3°F | Resp 16

## 2015-07-08 DIAGNOSIS — N390 Urinary tract infection, site not specified: Secondary | ICD-10-CM

## 2015-07-08 DIAGNOSIS — R3129 Other microscopic hematuria: Secondary | ICD-10-CM

## 2015-07-08 LAB — URINALYSIS, COMPLETE
Bilirubin, UA: NEGATIVE
GLUCOSE, UA: NEGATIVE
KETONES UA: NEGATIVE
NITRITE UA: NEGATIVE
SPEC GRAV UA: 1.02 (ref 1.005–1.030)
Urobilinogen, Ur: 0.2 mg/dL (ref 0.2–1.0)
pH, UA: 7.5 (ref 5.0–7.5)

## 2015-07-08 LAB — MICROSCOPIC EXAMINATION
Epithelial Cells (non renal): NONE SEEN /hpf (ref 0–10)
WBC, UA: >30 /hpf — ABNORMAL HIGH (ref 0–?)

## 2015-07-08 NOTE — Progress Notes (Signed)
In and Out Catheterization  Patient is present today for a I & O catheterization due to recurrent UTIs. Patient was cleaned and prepped in a sterile fashion with betadine and Lidocaine 2% jelly was instilled into the urethra.  A 14 FR cath was inserted no complications were noted , 100 ml of urine return was noted, urine was yellow in color. A clean urine sample was collected for culture. Bladder was drained  And catheter was removed with out difficulty.    Preformed by: Golden Hurter, CMA

## 2015-07-08 NOTE — Progress Notes (Addendum)
10:30 AM   Christina Hahn 05-Sep-1922 [id number]  Referring provider: [name], MD [address] [place], [place] [address]  Chief Complaint  Patient presents with  . Recurrent UTI    HPI:   Resident at [facility] [phone] Patient is a 82 year old female presenting today with her two daughters with complaints of frequent urinary tract infections.  She was recently admitted for a femur fracture and was noted that at that time to have a urinary tract infection. She has since been discharged and is a resident at [facility] for rehabilitation.  Urine culture on 03/18/15  80,000 COLONIES/ml ESCHERICHIA COLI  50,000 COLONIES/mL ENTEROCOCCUS FAECALIS  MIXED BACTERIAL ORGANISMS  Current Status: Patient presents today with staff member and is unable to provide accurate history. She has significant baseline dementia.   Patient is very pleasant but continues to repeatedly ask for a cup of coffee. UA suspicious for urinary tract infection.   She denies and urinary complaints.    PMH: Past Medical History  Diagnosis Date  . Arthritis   . Hypertension   . GERD (gastroesophageal reflux disease)   . Depression   . Eczema   . Asthma   . Anxiety   . Dementia   . Thyroid disease   . Stroke ([place])   . Chronic kidney disease     Surgical History: Past Surgical History  Procedure Laterality Date  . Cholecystectomy    . Tonsillectomy    . Abdominal hysterectomy    . Breast lumpectomy    . Laminectomy    . Cataract extraction    . Intramedullary (im) nail intertrochanteric Right 01/09/2015    Procedure: INTRAMEDULLARY (IM) NAIL INTERTROCHANTRIC;  Surgeon: Earnestine Leys, MD;  Location: [facility] ORS;  Service: Orthopedics;  Laterality: Right;    Home Medications:    Medication List       This list is accurate as of: 07/08/15 10:30 AM.  Always use your most recent med list.               acetaminophen 325 MG tablet  Commonly known as:  TYLENOL  Take 650 mg by mouth every  4 (four) hours as needed for mild pain.     albuterol 108 (90 Base) MCG/ACT inhaler  Commonly known as:  PROVENTIL HFA;VENTOLIN HFA  Inhale 2 puffs into the lungs every 8 (eight) hours as needed for shortness of breath.     albuterol (2.5 MG/3ML) 0.083% nebulizer solution  Commonly known as:  PROVENTIL  Take 2.5 mg by nebulization every 6 (six) hours as needed for shortness of breath.     aspirin 81 MG chewable tablet  Chew 81 mg by mouth daily.     Calcium Carbonate-Vitamin D 600-400 MG-UNIT tablet  Take 1 tablet by mouth daily.     cefUROXime 250 MG tablet  Commonly known as:  CEFTIN  Take 1 tablet (250 mg total) by mouth 2 (two) times daily with a meal.     citalopram 20 MG tablet  Commonly known as:  CELEXA  Take 20 mg by mouth 2 (two) times daily.     docusate sodium 100 MG capsule  Commonly known as:  COLACE  Take 1 capsule (100 mg total) by mouth 2 (two) times daily.     donepezil 5 MG tablet  Commonly known as:  ARICEPT  Take 5 mg by mouth daily.     erythromycin ophthalmic ointment  Place 1 application into the left eye 4 (four) times daily.  Fluticasone-Salmeterol 250-50 MCG/DOSE Aepb  Commonly known as:  ADVAIR  Inhale 1 puff into the lungs 2 (two) times daily.     lactose free nutrition Liqd  Take 237 mLs by mouth 2 (two) times daily as needed (when pt is not eating well).     feeding supplement (ENSURE ENLIVE) Liqd  Take 237 mLs by mouth 3 (three) times daily between meals.     levothyroxine 75 MCG tablet  Commonly known as:  SYNTHROID, LEVOTHROID  Take 75 mcg by mouth daily.     loratadine 10 MG tablet  Commonly known as:  CLARITIN  Take 10 mg by mouth daily.     magnesium hydroxide 400 MG/5ML suspension  Commonly known as:  MILK OF MAGNESIA  Take 30 mLs by mouth at bedtime.     montelukast 10 MG tablet  Commonly known as:  SINGULAIR  Take 10 mg by mouth daily.     nitrofurantoin (macrocrystal-monohydrate) 100 MG capsule  Commonly known  as:  MACROBID  Once daily     OLANZapine 2.5 MG tablet  Commonly known as:  ZYPREXA  Take 1 tablet (2.5 mg total) by mouth at bedtime.     omeprazole 20 MG capsule  Commonly known as:  PRILOSEC  Take 20 mg by mouth 2 (two) times daily.     oxycodone 5 MG capsule  Commonly known as:  OXY-IR  Take 1 capsule (5 mg total) by mouth every 6 (six) hours as needed for pain.     phenazopyridine 100 MG tablet  Commonly known as:  PYRIDIUM  Take 100 mg by mouth.     SYSTANE OP  Apply 1 drop to eye 2 (two) times daily as needed (for dry eyes).     THEREMS Tabs  Take 1 tablet by mouth 2 (two) times daily.     traMADol 50 MG tablet  Commonly known as:  ULTRAM  Take 50 mg by mouth.     trospium 20 MG tablet  Commonly known as:  SANCTURA  Take 20 mg by mouth daily.        Allergies:  Allergies  Allergen Reactions  . Antivert [Meclizine] Other (See Comments)    Reaction:  Dizziness   . Codeine Other (See Comments)    Reaction:  Gives pt nightmares   . Dramamine [Dimenhydrinate] Other (See Comments)    Reaction:  Dizziness   . Flonase [Fluticasone Propionate] Other (See Comments)    Reaction:  Unknown   . Nsaids Other (See Comments)    Reaction:  Unknown   . Penicillins Other (See Comments)    Reaction:  Unknown   . Shrimp [Shellfish Allergy] Other (See Comments)    Reaction:  Unknown   . Tape Other (See Comments)    Reaction:  Tears pts skin   . Voltaren [Diclofenac Sodium] Other (See Comments)    Reaction:  Unknown     Family History: Family History  Problem Relation Age of Onset  . Pancreatic cancer Mother   . CAD Father   . Lung cancer Sister   . Lung cancer Brother     Social History:  reports that she has never smoked. She does not have any smokeless tobacco history on file. She reports that she does not drink alcohol. Her drug history is not on file.  ROS: UROLOGY Frequent Urination?: No Hard to postpone urination?: No Burning/pain with urination?:  No Get up at night to urinate?: No Leakage of urine?: No Urine stream starts and  stops?: No Trouble starting stream?: No Do you have to strain to urinate?: No Blood in urine?: No Urinary tract infection?: No Sexually transmitted disease?: No Injury to kidneys or bladder?: No Painful intercourse?: No Weak stream?: No Currently pregnant?: No Vaginal bleeding?: No Last menstrual period?: n  Gastrointestinal Nausea?: No Vomiting?: No Indigestion/heartburn?: No Diarrhea?: No Constipation?: No  Constitutional Fever: No Night sweats?: No Weight loss?: No Fatigue?: No  Skin Skin rash/lesions?: No Itching?: No  Eyes Blurred vision?: No Double vision?: No  Ears/Nose/Throat Sore throat?: No Sinus problems?: No  Hematologic/Lymphatic Swollen glands?: No Easy bruising?: No  Cardiovascular Leg swelling?: No Chest pain?: No  Respiratory Cough?: No Shortness of breath?: No  Endocrine Excessive thirst?: No  Musculoskeletal Back pain?: No Joint pain?: No  Neurological Headaches?: No Dizziness?: No  Psychologic Depression?: No Anxiety?: No  Physical Exam: BP 113/66 mmHg  Pulse 66  Temp(Src) 98.3 F (36.8 C)  Resp 16  Ht   Wt   Constitutional:  Alert and oriented, No acute distress, wheelchair bound HEENT: [place] AT, moist mucus membranes.  Trachea midline, no masses. Cardiovascular: No clubbing, cyanosis, or edema. Respiratory: Normal respiratory effort, no increased work of breathing. GI: Abdomen is soft, nontender, nondistended, no abdominal masses GU: No CVA tenderness.  Skin: No rashes, bruises or suspicious lesions. Lymph: No cervical or inguinal adenopathy. Neurologic: Grossly intact, no focal deficits, moving all 4 extremities. Psychiatric: Normal mood and affect.  Laboratory Data:   Urinalysis   Pertinent Imaging:   Assessment & Plan:    1. UTI (lower urinary tract infection-  Cath specimen obtained. UA suspicious for infection though  nitrite negative. Sent for culture. Continue suppressive antibiotic therapy. UTI prevention strategies discussed.  Good perineal hygiene reviewed. Patient is encouraged to increase daily water intake, start cranberry supplements to prevent invasive colonization along the urinary tract and probiotics, especially lactobacillus to restore normal vaginal flora. - Urinalysis, Complete -Urinary Culture- will treat pending results and call in prescription to [facility]  2. Microscopic Hematuria- May need workup pending urine culture results.  Return in about 1 month (around 08/08/2015).  These notes generated with voice recognition software. I apologize for typographical errors.  Herbert Moors, [facility] [address] [place], [place] [address] [phone]

## 2015-07-10 LAB — CULTURE, URINE COMPREHENSIVE

## 2015-07-12 ENCOUNTER — Telehealth: Payer: Self-pay | Admitting: Obstetrics and Gynecology

## 2015-07-12 DIAGNOSIS — R3129 Other microscopic hematuria: Secondary | ICD-10-CM

## 2015-07-12 NOTE — Telephone Encounter (Signed)
Spoke with DD at [employer] and pt daughter Christina Hahn in reference to -ucx and rus. Both voiced understanding. Christina Hahn asked that she receive a phone call when RUS is scheduled. A note was made of the RUS orders for Christina Hahn to receive a call.

## 2015-07-12 NOTE — Telephone Encounter (Signed)
Please notify staff at [facility] as well as patient's daughters that her urine culture was negative for infection but she did have blood in her urine when she was seen at her last visit. I would like her to have a renal ultrasound performed and for her to come in to review results. I have already placed the order. Please ask if her daughters can come with her to that appointment because the patient does have significant baseline dementia.  thanks

## 2015-07-19 ENCOUNTER — Ambulatory Visit
Admission: RE | Admit: 2015-07-19 | Discharge: 2015-07-19 | Disposition: A | Discharge status: Home / Self Care | Payer: Medicare Other | Source: Ambulatory Visit | Attending: Obstetrics and Gynecology | Admitting: Obstetrics and Gynecology

## 2015-07-19 ENCOUNTER — Telehealth: Payer: Self-pay

## 2015-07-19 DIAGNOSIS — R3129 Other microscopic hematuria: Secondary | ICD-10-CM | POA: Diagnosis not present

## 2015-07-19 NOTE — Telephone Encounter (Signed)
[facility] Radiology called stating pt u/s results are back.

## 2015-07-19 NOTE — Telephone Encounter (Signed)
Is notify patient that there were some possible abnormalities seen on her ultrasound. Recommendations are for further evaluation with cystoscopy. I would like her her follow-up appointment with me to be changed to a cystoscopy appointment and review of her ultrasound results with an M.D. She has any further questions I would be happy to speak with her. Please have the front desk change her appointment it may need to be switched to another day. Thanks

## 2015-07-20 NOTE — Telephone Encounter (Signed)
Spoke with Elmyra Ricks, pt nurse at facility, in reference to u/s results. Made aware pt needs a cysto which means appt from [facility] will have to be changed to a MD appt. Elmyra Ricks voiced understanding and was transferred to the front to change appt.

## 2015-07-21 ENCOUNTER — Ambulatory Visit: Payer: Medicare Other | Admitting: Obstetrics and Gynecology

## 2015-07-26 ENCOUNTER — Ambulatory Visit (INDEPENDENT_AMBULATORY_CARE_PROVIDER_SITE_OTHER): Payer: Medicare Other | Admitting: Urology

## 2015-07-26 VITALS — BP 109/76 | HR 73

## 2015-07-26 DIAGNOSIS — R3129 Other microscopic hematuria: Secondary | ICD-10-CM | POA: Diagnosis not present

## 2015-07-26 DIAGNOSIS — R8271 Bacteriuria: Secondary | ICD-10-CM

## 2015-07-26 LAB — URINALYSIS, COMPLETE
BILIRUBIN UA: NEGATIVE
GLUCOSE, UA: NEGATIVE
NITRITE UA: POSITIVE — AB
SPEC GRAV UA: 1.02 (ref 1.005–1.030)
Urobilinogen, Ur: 0.2 mg/dL (ref 0.2–1.0)
pH, UA: 5.5 (ref 5.0–7.5)

## 2015-07-26 LAB — MICROSCOPIC EXAMINATION
Epithelial Cells (non renal): NONE SEEN /hpf (ref 0–10)
RBC MICROSCOPIC, UA: NONE SEEN /HPF (ref 0–?)

## 2015-07-26 NOTE — Progress Notes (Signed)
Follow-up for microscopic hematuria and recurrent urinary tract infection. Patient was seen about 3 weeks ago. History of recurrent urinary tract infection. Patient is 82 years old and has some dementia. She lives in a nursing home. She does not eat and drink without reminders. She recently broke her hip. When she was seen she had 3-10 red blood cells per high powered field. Her urine culture was negative. She continues nitrofurantoin prophylaxis. She's had no gross hematuria. She is here with her 4 daughters.  She underwent a renal ultrasound which shows no mass stone or hydronephrosis of either kidney. The bladder appeared normal apart from some layering debris dependently. I reviewed all the images.  Post void residual by catheterization = 30 mL  Physical exam: Christina Hahn was chaperone GU-vaginal atrophy but no mass. The introitus was narrow and the meatus withdrawn. The bladder and urethra were palpably normal.  Cystoscopy: After timeout was performed the patient was prepped in the usual sterile fashion. The cystoscope was passed per urethra and the bladder inspected, the bladder irrigated and the scope removed. Findings: Visualization was cloudy but there was no obvious stone, tumor or mass in the bladder. The trigone appeared normal. There was layering white debris in the bladder which was evacuated and the bladder inspected again.   Assessment/plan:  #1 bacteriuria-discussed with patient and her family (4 daughters), bacteriuria is common and likely will persist. We discussed per ID guidelines, positive urine cultures not need to be treated unless there is concern for infection such as symptoms of dysuria, fever, mental status changes without other clearly identifiable etiology. Antibiotics have not been shown to prevent future symptomatic urinary tract infection, have many side effects and can lead to resistance.   #2 microscopic hematuria - benign evaluation   See back as needed.

## 2015-07-26 NOTE — Addendum Note (Signed)
Addended by: Wilson Singer on: 07/26/2015 04:34 PM   Modules accepted: Orders

## 2015-07-28 LAB — CULTURE, URINE COMPREHENSIVE

## 2015-07-30 ENCOUNTER — Telehealth: Payer: Self-pay

## 2015-07-30 NOTE — Telephone Encounter (Signed)
-----   Message from Festus Aloe, MD sent at 07/30/2015  7:46 AM EST ----- Notify patient's daughter (s) her urine culture was positive, but she doesn't need antibiotics now. If she develops symptoms (fever, white count, bladder pain, etc.) she might need antibiotics.  ----- Message -----    From: Lestine Box, LPN    Sent: [id number]   8:56 AM      To: Festus Aloe, MD    ----- Message -----    From: [facility] Lab Results In Interface    Sent: 07/26/2015   4:38 PM      To: [facility]

## 2015-07-30 NOTE — Telephone Encounter (Signed)
Spoke with Elmyra Ricks, pt nurse at facility, and made aware at this point +ucx will not be treated unless pt is symptomatic. Elmyra Ricks stated pt has been "fine and not having any symptoms". Reinforced with Elmyra Ricks is pt develops n/v, f/c, lower abd pain to give [place] a call. Elmyra Ricks voiced understanding. Left message for both pt emergency contacts.

## 2015-08-02 DIAGNOSIS — B351 Tinea unguium: Secondary | ICD-10-CM | POA: Diagnosis not present

## 2015-08-02 DIAGNOSIS — M79671 Pain in right foot: Secondary | ICD-10-CM | POA: Diagnosis not present

## 2015-08-02 DIAGNOSIS — M79672 Pain in left foot: Secondary | ICD-10-CM | POA: Diagnosis not present

## 2015-08-03 DIAGNOSIS — F330 Major depressive disorder, recurrent, mild: Secondary | ICD-10-CM | POA: Diagnosis not present

## 2015-08-03 DIAGNOSIS — F419 Anxiety disorder, unspecified: Secondary | ICD-10-CM | POA: Diagnosis not present

## 2015-08-03 DIAGNOSIS — E538 Deficiency of other specified B group vitamins: Secondary | ICD-10-CM | POA: Diagnosis not present

## 2015-08-03 DIAGNOSIS — I6930 Unspecified sequelae of cerebral infarction: Secondary | ICD-10-CM | POA: Diagnosis not present

## 2015-08-06 DIAGNOSIS — K219 Gastro-esophageal reflux disease without esophagitis: Secondary | ICD-10-CM | POA: Diagnosis not present

## 2015-08-06 DIAGNOSIS — I10 Essential (primary) hypertension: Secondary | ICD-10-CM | POA: Diagnosis not present

## 2015-08-06 DIAGNOSIS — S72009A Fracture of unspecified part of neck of unspecified femur, initial encounter for closed fracture: Secondary | ICD-10-CM | POA: Diagnosis not present

## 2015-08-06 DIAGNOSIS — E039 Hypothyroidism, unspecified: Secondary | ICD-10-CM | POA: Diagnosis not present

## 2015-08-06 DIAGNOSIS — D649 Anemia, unspecified: Secondary | ICD-10-CM | POA: Diagnosis not present

## 2015-08-06 DIAGNOSIS — I6350 Cerebral infarction due to unspecified occlusion or stenosis of unspecified cerebral artery: Secondary | ICD-10-CM | POA: Diagnosis not present

## 2015-08-06 DIAGNOSIS — F329 Major depressive disorder, single episode, unspecified: Secondary | ICD-10-CM | POA: Diagnosis not present

## 2015-08-06 DIAGNOSIS — F0390 Unspecified dementia without behavioral disturbance: Secondary | ICD-10-CM | POA: Diagnosis not present

## 2015-08-06 DIAGNOSIS — F419 Anxiety disorder, unspecified: Secondary | ICD-10-CM | POA: Diagnosis not present

## 2015-08-06 DIAGNOSIS — J45901 Unspecified asthma with (acute) exacerbation: Secondary | ICD-10-CM | POA: Diagnosis not present

## 2015-08-09 ENCOUNTER — Ambulatory Visit: Payer: Medicare Other | Admitting: Obstetrics and Gynecology

## 2015-08-17 DIAGNOSIS — I10 Essential (primary) hypertension: Secondary | ICD-10-CM | POA: Diagnosis not present

## 2015-08-19 DIAGNOSIS — N390 Urinary tract infection, site not specified: Secondary | ICD-10-CM | POA: Diagnosis not present

## 2015-08-28 DIAGNOSIS — N390 Urinary tract infection, site not specified: Secondary | ICD-10-CM | POA: Diagnosis not present

## 2015-08-30 DIAGNOSIS — I10 Essential (primary) hypertension: Secondary | ICD-10-CM | POA: Diagnosis not present

## 2015-08-30 DIAGNOSIS — N183 Chronic kidney disease, stage 3 (moderate): Secondary | ICD-10-CM | POA: Diagnosis not present

## 2015-08-30 DIAGNOSIS — R809 Proteinuria, unspecified: Secondary | ICD-10-CM | POA: Diagnosis not present

## 2015-08-30 DIAGNOSIS — R600 Localized edema: Secondary | ICD-10-CM | POA: Diagnosis not present

## 2015-09-16 DIAGNOSIS — D649 Anemia, unspecified: Secondary | ICD-10-CM | POA: Diagnosis not present

## 2015-09-17 DIAGNOSIS — K5900 Constipation, unspecified: Secondary | ICD-10-CM | POA: Diagnosis not present

## 2015-09-17 DIAGNOSIS — J45901 Unspecified asthma with (acute) exacerbation: Secondary | ICD-10-CM | POA: Diagnosis not present

## 2015-09-17 DIAGNOSIS — I10 Essential (primary) hypertension: Secondary | ICD-10-CM | POA: Diagnosis not present

## 2015-09-17 DIAGNOSIS — M25559 Pain in unspecified hip: Secondary | ICD-10-CM | POA: Diagnosis not present

## 2015-09-17 DIAGNOSIS — D649 Anemia, unspecified: Secondary | ICD-10-CM | POA: Diagnosis not present

## 2015-09-17 DIAGNOSIS — F0390 Unspecified dementia without behavioral disturbance: Secondary | ICD-10-CM | POA: Diagnosis not present

## 2015-09-17 DIAGNOSIS — E039 Hypothyroidism, unspecified: Secondary | ICD-10-CM | POA: Diagnosis not present

## 2015-09-27 DIAGNOSIS — N390 Urinary tract infection, site not specified: Secondary | ICD-10-CM | POA: Diagnosis not present

## 2015-09-28 DIAGNOSIS — F419 Anxiety disorder, unspecified: Secondary | ICD-10-CM | POA: Diagnosis not present

## 2015-09-28 DIAGNOSIS — F330 Major depressive disorder, recurrent, mild: Secondary | ICD-10-CM | POA: Diagnosis not present

## 2015-09-28 DIAGNOSIS — I6930 Unspecified sequelae of cerebral infarction: Secondary | ICD-10-CM | POA: Diagnosis not present

## 2015-09-28 DIAGNOSIS — E538 Deficiency of other specified B group vitamins: Secondary | ICD-10-CM | POA: Diagnosis not present

## 2015-10-04 DIAGNOSIS — N390 Urinary tract infection, site not specified: Secondary | ICD-10-CM | POA: Diagnosis not present

## 2015-10-09 DIAGNOSIS — N390 Urinary tract infection, site not specified: Secondary | ICD-10-CM | POA: Diagnosis not present

## 2015-10-13 DIAGNOSIS — N3281 Overactive bladder: Secondary | ICD-10-CM | POA: Diagnosis not present

## 2015-10-13 DIAGNOSIS — B309 Viral conjunctivitis, unspecified: Secondary | ICD-10-CM | POA: Diagnosis not present

## 2015-10-13 DIAGNOSIS — I2510 Atherosclerotic heart disease of native coronary artery without angina pectoris: Secondary | ICD-10-CM | POA: Diagnosis not present

## 2015-10-13 DIAGNOSIS — E038 Other specified hypothyroidism: Secondary | ICD-10-CM | POA: Diagnosis not present

## 2015-10-13 DIAGNOSIS — R41841 Cognitive communication deficit: Secondary | ICD-10-CM | POA: Diagnosis not present

## 2015-10-13 DIAGNOSIS — M6281 Muscle weakness (generalized): Secondary | ICD-10-CM | POA: Diagnosis not present

## 2015-10-14 DIAGNOSIS — I2510 Atherosclerotic heart disease of native coronary artery without angina pectoris: Secondary | ICD-10-CM | POA: Diagnosis not present

## 2015-10-14 DIAGNOSIS — R41841 Cognitive communication deficit: Secondary | ICD-10-CM | POA: Diagnosis not present

## 2015-10-14 DIAGNOSIS — B309 Viral conjunctivitis, unspecified: Secondary | ICD-10-CM | POA: Diagnosis not present

## 2015-10-14 DIAGNOSIS — M6281 Muscle weakness (generalized): Secondary | ICD-10-CM | POA: Diagnosis not present

## 2015-10-14 DIAGNOSIS — N3281 Overactive bladder: Secondary | ICD-10-CM | POA: Diagnosis not present

## 2015-10-14 DIAGNOSIS — E038 Other specified hypothyroidism: Secondary | ICD-10-CM | POA: Diagnosis not present

## 2015-10-15 DIAGNOSIS — B309 Viral conjunctivitis, unspecified: Secondary | ICD-10-CM | POA: Diagnosis not present

## 2015-10-15 DIAGNOSIS — I2510 Atherosclerotic heart disease of native coronary artery without angina pectoris: Secondary | ICD-10-CM | POA: Diagnosis not present

## 2015-10-15 DIAGNOSIS — N3281 Overactive bladder: Secondary | ICD-10-CM | POA: Diagnosis not present

## 2015-10-15 DIAGNOSIS — M6281 Muscle weakness (generalized): Secondary | ICD-10-CM | POA: Diagnosis not present

## 2015-10-15 DIAGNOSIS — R41841 Cognitive communication deficit: Secondary | ICD-10-CM | POA: Diagnosis not present

## 2015-10-15 DIAGNOSIS — E038 Other specified hypothyroidism: Secondary | ICD-10-CM | POA: Diagnosis not present

## 2015-10-18 DIAGNOSIS — E038 Other specified hypothyroidism: Secondary | ICD-10-CM | POA: Diagnosis not present

## 2015-10-18 DIAGNOSIS — R41841 Cognitive communication deficit: Secondary | ICD-10-CM | POA: Diagnosis not present

## 2015-10-18 DIAGNOSIS — R1312 Dysphagia, oropharyngeal phase: Secondary | ICD-10-CM | POA: Diagnosis not present

## 2015-10-18 DIAGNOSIS — M6281 Muscle weakness (generalized): Secondary | ICD-10-CM | POA: Diagnosis not present

## 2015-10-18 DIAGNOSIS — N3281 Overactive bladder: Secondary | ICD-10-CM | POA: Diagnosis not present

## 2015-10-18 DIAGNOSIS — I2510 Atherosclerotic heart disease of native coronary artery without angina pectoris: Secondary | ICD-10-CM | POA: Diagnosis not present

## 2015-10-18 DIAGNOSIS — B309 Viral conjunctivitis, unspecified: Secondary | ICD-10-CM | POA: Diagnosis not present

## 2015-10-19 DIAGNOSIS — M6281 Muscle weakness (generalized): Secondary | ICD-10-CM | POA: Diagnosis not present

## 2015-10-19 DIAGNOSIS — R41841 Cognitive communication deficit: Secondary | ICD-10-CM | POA: Diagnosis not present

## 2015-10-19 DIAGNOSIS — R1312 Dysphagia, oropharyngeal phase: Secondary | ICD-10-CM | POA: Diagnosis not present

## 2015-10-19 DIAGNOSIS — I2510 Atherosclerotic heart disease of native coronary artery without angina pectoris: Secondary | ICD-10-CM | POA: Diagnosis not present

## 2015-10-19 DIAGNOSIS — N3281 Overactive bladder: Secondary | ICD-10-CM | POA: Diagnosis not present

## 2015-10-19 DIAGNOSIS — B309 Viral conjunctivitis, unspecified: Secondary | ICD-10-CM | POA: Diagnosis not present

## 2015-10-20 DIAGNOSIS — M6281 Muscle weakness (generalized): Secondary | ICD-10-CM | POA: Diagnosis not present

## 2015-10-20 DIAGNOSIS — R41841 Cognitive communication deficit: Secondary | ICD-10-CM | POA: Diagnosis not present

## 2015-10-20 DIAGNOSIS — N390 Urinary tract infection, site not specified: Secondary | ICD-10-CM | POA: Diagnosis not present

## 2015-10-20 DIAGNOSIS — R1312 Dysphagia, oropharyngeal phase: Secondary | ICD-10-CM | POA: Diagnosis not present

## 2015-10-20 DIAGNOSIS — N3281 Overactive bladder: Secondary | ICD-10-CM | POA: Diagnosis not present

## 2015-10-20 DIAGNOSIS — B309 Viral conjunctivitis, unspecified: Secondary | ICD-10-CM | POA: Diagnosis not present

## 2015-10-20 DIAGNOSIS — I2510 Atherosclerotic heart disease of native coronary artery without angina pectoris: Secondary | ICD-10-CM | POA: Diagnosis not present

## 2015-10-21 DIAGNOSIS — I2510 Atherosclerotic heart disease of native coronary artery without angina pectoris: Secondary | ICD-10-CM | POA: Diagnosis not present

## 2015-10-21 DIAGNOSIS — M6281 Muscle weakness (generalized): Secondary | ICD-10-CM | POA: Diagnosis not present

## 2015-10-21 DIAGNOSIS — B309 Viral conjunctivitis, unspecified: Secondary | ICD-10-CM | POA: Diagnosis not present

## 2015-10-21 DIAGNOSIS — R41841 Cognitive communication deficit: Secondary | ICD-10-CM | POA: Diagnosis not present

## 2015-10-21 DIAGNOSIS — N3281 Overactive bladder: Secondary | ICD-10-CM | POA: Diagnosis not present

## 2015-10-21 DIAGNOSIS — R1312 Dysphagia, oropharyngeal phase: Secondary | ICD-10-CM | POA: Diagnosis not present

## 2015-10-22 DIAGNOSIS — I2510 Atherosclerotic heart disease of native coronary artery without angina pectoris: Secondary | ICD-10-CM | POA: Diagnosis not present

## 2015-10-22 DIAGNOSIS — M6281 Muscle weakness (generalized): Secondary | ICD-10-CM | POA: Diagnosis not present

## 2015-10-22 DIAGNOSIS — B309 Viral conjunctivitis, unspecified: Secondary | ICD-10-CM | POA: Diagnosis not present

## 2015-10-22 DIAGNOSIS — R1312 Dysphagia, oropharyngeal phase: Secondary | ICD-10-CM | POA: Diagnosis not present

## 2015-10-22 DIAGNOSIS — R41841 Cognitive communication deficit: Secondary | ICD-10-CM | POA: Diagnosis not present

## 2015-10-22 DIAGNOSIS — N3281 Overactive bladder: Secondary | ICD-10-CM | POA: Diagnosis not present

## 2015-10-26 DIAGNOSIS — R41841 Cognitive communication deficit: Secondary | ICD-10-CM | POA: Diagnosis not present

## 2015-10-26 DIAGNOSIS — M6281 Muscle weakness (generalized): Secondary | ICD-10-CM | POA: Diagnosis not present

## 2015-10-26 DIAGNOSIS — B309 Viral conjunctivitis, unspecified: Secondary | ICD-10-CM | POA: Diagnosis not present

## 2015-10-26 DIAGNOSIS — R1312 Dysphagia, oropharyngeal phase: Secondary | ICD-10-CM | POA: Diagnosis not present

## 2015-10-26 DIAGNOSIS — N3281 Overactive bladder: Secondary | ICD-10-CM | POA: Diagnosis not present

## 2015-10-26 DIAGNOSIS — I2510 Atherosclerotic heart disease of native coronary artery without angina pectoris: Secondary | ICD-10-CM | POA: Diagnosis not present

## 2015-10-27 DIAGNOSIS — I2510 Atherosclerotic heart disease of native coronary artery without angina pectoris: Secondary | ICD-10-CM | POA: Diagnosis not present

## 2015-10-27 DIAGNOSIS — N3281 Overactive bladder: Secondary | ICD-10-CM | POA: Diagnosis not present

## 2015-10-27 DIAGNOSIS — R41841 Cognitive communication deficit: Secondary | ICD-10-CM | POA: Diagnosis not present

## 2015-10-27 DIAGNOSIS — R1312 Dysphagia, oropharyngeal phase: Secondary | ICD-10-CM | POA: Diagnosis not present

## 2015-10-27 DIAGNOSIS — B309 Viral conjunctivitis, unspecified: Secondary | ICD-10-CM | POA: Diagnosis not present

## 2015-10-27 DIAGNOSIS — M6281 Muscle weakness (generalized): Secondary | ICD-10-CM | POA: Diagnosis not present

## 2015-10-28 DIAGNOSIS — R41841 Cognitive communication deficit: Secondary | ICD-10-CM | POA: Diagnosis not present

## 2015-10-28 DIAGNOSIS — B309 Viral conjunctivitis, unspecified: Secondary | ICD-10-CM | POA: Diagnosis not present

## 2015-10-28 DIAGNOSIS — R1312 Dysphagia, oropharyngeal phase: Secondary | ICD-10-CM | POA: Diagnosis not present

## 2015-10-28 DIAGNOSIS — I2510 Atherosclerotic heart disease of native coronary artery without angina pectoris: Secondary | ICD-10-CM | POA: Diagnosis not present

## 2015-10-28 DIAGNOSIS — N3281 Overactive bladder: Secondary | ICD-10-CM | POA: Diagnosis not present

## 2015-10-28 DIAGNOSIS — M6281 Muscle weakness (generalized): Secondary | ICD-10-CM | POA: Diagnosis not present

## 2015-11-01 DIAGNOSIS — R1312 Dysphagia, oropharyngeal phase: Secondary | ICD-10-CM | POA: Diagnosis not present

## 2015-11-01 DIAGNOSIS — I2510 Atherosclerotic heart disease of native coronary artery without angina pectoris: Secondary | ICD-10-CM | POA: Diagnosis not present

## 2015-11-01 DIAGNOSIS — R41841 Cognitive communication deficit: Secondary | ICD-10-CM | POA: Diagnosis not present

## 2015-11-01 DIAGNOSIS — M6281 Muscle weakness (generalized): Secondary | ICD-10-CM | POA: Diagnosis not present

## 2015-11-01 DIAGNOSIS — B309 Viral conjunctivitis, unspecified: Secondary | ICD-10-CM | POA: Diagnosis not present

## 2015-11-01 DIAGNOSIS — N3281 Overactive bladder: Secondary | ICD-10-CM | POA: Diagnosis not present

## 2015-11-03 DIAGNOSIS — B309 Viral conjunctivitis, unspecified: Secondary | ICD-10-CM | POA: Diagnosis not present

## 2015-11-03 DIAGNOSIS — N3281 Overactive bladder: Secondary | ICD-10-CM | POA: Diagnosis not present

## 2015-11-03 DIAGNOSIS — M6281 Muscle weakness (generalized): Secondary | ICD-10-CM | POA: Diagnosis not present

## 2015-11-03 DIAGNOSIS — I2510 Atherosclerotic heart disease of native coronary artery without angina pectoris: Secondary | ICD-10-CM | POA: Diagnosis not present

## 2015-11-03 DIAGNOSIS — R41841 Cognitive communication deficit: Secondary | ICD-10-CM | POA: Diagnosis not present

## 2015-11-03 DIAGNOSIS — R1312 Dysphagia, oropharyngeal phase: Secondary | ICD-10-CM | POA: Diagnosis not present

## 2015-11-04 DIAGNOSIS — R1312 Dysphagia, oropharyngeal phase: Secondary | ICD-10-CM | POA: Diagnosis not present

## 2015-11-04 DIAGNOSIS — I2510 Atherosclerotic heart disease of native coronary artery without angina pectoris: Secondary | ICD-10-CM | POA: Diagnosis not present

## 2015-11-04 DIAGNOSIS — B309 Viral conjunctivitis, unspecified: Secondary | ICD-10-CM | POA: Diagnosis not present

## 2015-11-04 DIAGNOSIS — R41841 Cognitive communication deficit: Secondary | ICD-10-CM | POA: Diagnosis not present

## 2015-11-04 DIAGNOSIS — M6281 Muscle weakness (generalized): Secondary | ICD-10-CM | POA: Diagnosis not present

## 2015-11-04 DIAGNOSIS — N3281 Overactive bladder: Secondary | ICD-10-CM | POA: Diagnosis not present

## 2015-11-09 DIAGNOSIS — B309 Viral conjunctivitis, unspecified: Secondary | ICD-10-CM | POA: Diagnosis not present

## 2015-11-09 DIAGNOSIS — R41841 Cognitive communication deficit: Secondary | ICD-10-CM | POA: Diagnosis not present

## 2015-11-09 DIAGNOSIS — N3281 Overactive bladder: Secondary | ICD-10-CM | POA: Diagnosis not present

## 2015-11-09 DIAGNOSIS — M6281 Muscle weakness (generalized): Secondary | ICD-10-CM | POA: Diagnosis not present

## 2015-11-09 DIAGNOSIS — I2510 Atherosclerotic heart disease of native coronary artery without angina pectoris: Secondary | ICD-10-CM | POA: Diagnosis not present

## 2015-11-09 DIAGNOSIS — R1312 Dysphagia, oropharyngeal phase: Secondary | ICD-10-CM | POA: Diagnosis not present

## 2015-11-12 DIAGNOSIS — M6281 Muscle weakness (generalized): Secondary | ICD-10-CM | POA: Diagnosis not present

## 2015-11-12 DIAGNOSIS — R1312 Dysphagia, oropharyngeal phase: Secondary | ICD-10-CM | POA: Diagnosis not present

## 2015-11-12 DIAGNOSIS — I2510 Atherosclerotic heart disease of native coronary artery without angina pectoris: Secondary | ICD-10-CM | POA: Diagnosis not present

## 2015-11-12 DIAGNOSIS — N3281 Overactive bladder: Secondary | ICD-10-CM | POA: Diagnosis not present

## 2015-11-12 DIAGNOSIS — B309 Viral conjunctivitis, unspecified: Secondary | ICD-10-CM | POA: Diagnosis not present

## 2015-11-12 DIAGNOSIS — R41841 Cognitive communication deficit: Secondary | ICD-10-CM | POA: Diagnosis not present

## 2015-11-15 DIAGNOSIS — R41841 Cognitive communication deficit: Secondary | ICD-10-CM | POA: Diagnosis not present

## 2015-11-15 DIAGNOSIS — N390 Urinary tract infection, site not specified: Secondary | ICD-10-CM | POA: Diagnosis not present

## 2015-11-15 DIAGNOSIS — B309 Viral conjunctivitis, unspecified: Secondary | ICD-10-CM | POA: Diagnosis not present

## 2015-11-15 DIAGNOSIS — I2510 Atherosclerotic heart disease of native coronary artery without angina pectoris: Secondary | ICD-10-CM | POA: Diagnosis not present

## 2015-11-15 DIAGNOSIS — M6281 Muscle weakness (generalized): Secondary | ICD-10-CM | POA: Diagnosis not present

## 2015-11-15 DIAGNOSIS — R1312 Dysphagia, oropharyngeal phase: Secondary | ICD-10-CM | POA: Diagnosis not present

## 2015-11-15 DIAGNOSIS — N3281 Overactive bladder: Secondary | ICD-10-CM | POA: Diagnosis not present

## 2015-11-16 DIAGNOSIS — R41841 Cognitive communication deficit: Secondary | ICD-10-CM | POA: Diagnosis not present

## 2015-11-16 DIAGNOSIS — B309 Viral conjunctivitis, unspecified: Secondary | ICD-10-CM | POA: Diagnosis not present

## 2015-11-16 DIAGNOSIS — M6281 Muscle weakness (generalized): Secondary | ICD-10-CM | POA: Diagnosis not present

## 2015-11-16 DIAGNOSIS — I2510 Atherosclerotic heart disease of native coronary artery without angina pectoris: Secondary | ICD-10-CM | POA: Diagnosis not present

## 2015-11-16 DIAGNOSIS — R1312 Dysphagia, oropharyngeal phase: Secondary | ICD-10-CM | POA: Diagnosis not present

## 2015-11-16 DIAGNOSIS — N3281 Overactive bladder: Secondary | ICD-10-CM | POA: Diagnosis not present

## 2015-11-17 DIAGNOSIS — R41841 Cognitive communication deficit: Secondary | ICD-10-CM | POA: Diagnosis not present

## 2015-11-17 DIAGNOSIS — R1312 Dysphagia, oropharyngeal phase: Secondary | ICD-10-CM | POA: Diagnosis not present

## 2015-11-17 DIAGNOSIS — I2510 Atherosclerotic heart disease of native coronary artery without angina pectoris: Secondary | ICD-10-CM | POA: Diagnosis not present

## 2015-11-17 DIAGNOSIS — B309 Viral conjunctivitis, unspecified: Secondary | ICD-10-CM | POA: Diagnosis not present

## 2015-11-17 DIAGNOSIS — M6281 Muscle weakness (generalized): Secondary | ICD-10-CM | POA: Diagnosis not present

## 2015-11-17 DIAGNOSIS — N3281 Overactive bladder: Secondary | ICD-10-CM | POA: Diagnosis not present

## 2015-11-18 DIAGNOSIS — F0390 Unspecified dementia without behavioral disturbance: Secondary | ICD-10-CM | POA: Diagnosis not present

## 2015-11-18 DIAGNOSIS — K5900 Constipation, unspecified: Secondary | ICD-10-CM | POA: Diagnosis not present

## 2015-11-18 DIAGNOSIS — E039 Hypothyroidism, unspecified: Secondary | ICD-10-CM | POA: Diagnosis not present

## 2015-11-18 DIAGNOSIS — S72009A Fracture of unspecified part of neck of unspecified femur, initial encounter for closed fracture: Secondary | ICD-10-CM | POA: Diagnosis not present

## 2015-11-18 DIAGNOSIS — I10 Essential (primary) hypertension: Secondary | ICD-10-CM | POA: Diagnosis not present

## 2015-11-18 DIAGNOSIS — F329 Major depressive disorder, single episode, unspecified: Secondary | ICD-10-CM | POA: Diagnosis not present

## 2015-11-18 DIAGNOSIS — D649 Anemia, unspecified: Secondary | ICD-10-CM | POA: Diagnosis not present

## 2015-11-23 DIAGNOSIS — F330 Major depressive disorder, recurrent, mild: Secondary | ICD-10-CM | POA: Diagnosis not present

## 2015-11-23 DIAGNOSIS — I6930 Unspecified sequelae of cerebral infarction: Secondary | ICD-10-CM | POA: Diagnosis not present

## 2015-11-23 DIAGNOSIS — F419 Anxiety disorder, unspecified: Secondary | ICD-10-CM | POA: Diagnosis not present

## 2015-11-23 DIAGNOSIS — E538 Deficiency of other specified B group vitamins: Secondary | ICD-10-CM | POA: Diagnosis not present

## 2015-11-24 DIAGNOSIS — E559 Vitamin D deficiency, unspecified: Secondary | ICD-10-CM | POA: Diagnosis not present

## 2015-11-24 DIAGNOSIS — F0390 Unspecified dementia without behavioral disturbance: Secondary | ICD-10-CM | POA: Diagnosis not present

## 2015-11-24 DIAGNOSIS — M2550 Pain in unspecified joint: Secondary | ICD-10-CM | POA: Diagnosis not present

## 2015-11-30 DIAGNOSIS — B351 Tinea unguium: Secondary | ICD-10-CM | POA: Diagnosis not present

## 2015-11-30 DIAGNOSIS — M79672 Pain in left foot: Secondary | ICD-10-CM | POA: Diagnosis not present

## 2015-11-30 DIAGNOSIS — M79671 Pain in right foot: Secondary | ICD-10-CM | POA: Diagnosis not present

## 2015-12-07 DIAGNOSIS — F0280 Dementia in other diseases classified elsewhere without behavioral disturbance: Secondary | ICD-10-CM | POA: Diagnosis not present

## 2016-01-13 DIAGNOSIS — H02034 Senile entropion of left upper eyelid: Secondary | ICD-10-CM | POA: Diagnosis not present

## 2016-01-13 DIAGNOSIS — H353133 Nonexudative age-related macular degeneration, bilateral, advanced atrophic without subfoveal involvement: Secondary | ICD-10-CM | POA: Diagnosis not present

## 2016-01-13 DIAGNOSIS — H02032 Senile entropion of right lower eyelid: Secondary | ICD-10-CM | POA: Diagnosis not present

## 2016-01-20 DIAGNOSIS — K5900 Constipation, unspecified: Secondary | ICD-10-CM | POA: Diagnosis not present

## 2016-01-20 DIAGNOSIS — M25559 Pain in unspecified hip: Secondary | ICD-10-CM | POA: Diagnosis not present

## 2016-01-20 DIAGNOSIS — E039 Hypothyroidism, unspecified: Secondary | ICD-10-CM | POA: Diagnosis not present

## 2016-01-20 DIAGNOSIS — J45901 Unspecified asthma with (acute) exacerbation: Secondary | ICD-10-CM | POA: Diagnosis not present

## 2016-01-20 DIAGNOSIS — F0390 Unspecified dementia without behavioral disturbance: Secondary | ICD-10-CM | POA: Diagnosis not present

## 2016-01-20 DIAGNOSIS — I10 Essential (primary) hypertension: Secondary | ICD-10-CM | POA: Diagnosis not present

## 2016-01-20 DIAGNOSIS — D649 Anemia, unspecified: Secondary | ICD-10-CM | POA: Diagnosis not present

## 2016-02-08 DIAGNOSIS — E538 Deficiency of other specified B group vitamins: Secondary | ICD-10-CM | POA: Diagnosis not present

## 2016-02-08 DIAGNOSIS — F330 Major depressive disorder, recurrent, mild: Secondary | ICD-10-CM | POA: Diagnosis not present

## 2016-02-08 DIAGNOSIS — I6930 Unspecified sequelae of cerebral infarction: Secondary | ICD-10-CM | POA: Diagnosis not present

## 2016-02-08 DIAGNOSIS — F419 Anxiety disorder, unspecified: Secondary | ICD-10-CM | POA: Diagnosis not present

## 2016-02-09 DIAGNOSIS — E539 Vitamin B deficiency, unspecified: Secondary | ICD-10-CM | POA: Diagnosis not present

## 2016-03-06 DIAGNOSIS — N183 Chronic kidney disease, stage 3 (moderate): Secondary | ICD-10-CM | POA: Diagnosis not present

## 2016-03-06 DIAGNOSIS — I10 Essential (primary) hypertension: Secondary | ICD-10-CM | POA: Diagnosis not present

## 2016-03-06 DIAGNOSIS — R809 Proteinuria, unspecified: Secondary | ICD-10-CM | POA: Diagnosis not present

## 2016-03-06 DIAGNOSIS — R600 Localized edema: Secondary | ICD-10-CM | POA: Diagnosis not present

## 2016-03-07 DIAGNOSIS — F419 Anxiety disorder, unspecified: Secondary | ICD-10-CM | POA: Diagnosis not present

## 2016-03-07 DIAGNOSIS — I6930 Unspecified sequelae of cerebral infarction: Secondary | ICD-10-CM | POA: Diagnosis not present

## 2016-03-07 DIAGNOSIS — F330 Major depressive disorder, recurrent, mild: Secondary | ICD-10-CM | POA: Diagnosis not present

## 2016-03-07 DIAGNOSIS — E538 Deficiency of other specified B group vitamins: Secondary | ICD-10-CM | POA: Diagnosis not present

## 2016-03-22 DIAGNOSIS — K219 Gastro-esophageal reflux disease without esophagitis: Secondary | ICD-10-CM | POA: Diagnosis not present

## 2016-03-22 DIAGNOSIS — D519 Vitamin B12 deficiency anemia, unspecified: Secondary | ICD-10-CM | POA: Diagnosis not present

## 2016-03-22 DIAGNOSIS — I10 Essential (primary) hypertension: Secondary | ICD-10-CM | POA: Diagnosis not present

## 2016-03-22 DIAGNOSIS — F329 Major depressive disorder, single episode, unspecified: Secondary | ICD-10-CM | POA: Diagnosis not present

## 2016-03-22 DIAGNOSIS — F419 Anxiety disorder, unspecified: Secondary | ICD-10-CM | POA: Diagnosis not present

## 2016-03-22 DIAGNOSIS — F0390 Unspecified dementia without behavioral disturbance: Secondary | ICD-10-CM | POA: Diagnosis not present

## 2016-03-22 DIAGNOSIS — K5900 Constipation, unspecified: Secondary | ICD-10-CM | POA: Diagnosis not present

## 2016-03-22 DIAGNOSIS — S72009A Fracture of unspecified part of neck of unspecified femur, initial encounter for closed fracture: Secondary | ICD-10-CM | POA: Diagnosis not present

## 2016-03-29 DIAGNOSIS — R197 Diarrhea, unspecified: Secondary | ICD-10-CM | POA: Diagnosis not present

## 2016-04-13 DIAGNOSIS — N390 Urinary tract infection, site not specified: Secondary | ICD-10-CM | POA: Diagnosis not present

## 2016-05-01 DIAGNOSIS — M79674 Pain in right toe(s): Secondary | ICD-10-CM | POA: Diagnosis not present

## 2016-05-01 DIAGNOSIS — I70203 Unspecified atherosclerosis of native arteries of extremities, bilateral legs: Secondary | ICD-10-CM | POA: Diagnosis not present

## 2016-05-01 DIAGNOSIS — B351 Tinea unguium: Secondary | ICD-10-CM | POA: Diagnosis not present

## 2016-05-01 DIAGNOSIS — M79675 Pain in left toe(s): Secondary | ICD-10-CM | POA: Diagnosis not present

## 2016-05-09 DIAGNOSIS — E538 Deficiency of other specified B group vitamins: Secondary | ICD-10-CM | POA: Diagnosis not present

## 2016-05-09 DIAGNOSIS — F419 Anxiety disorder, unspecified: Secondary | ICD-10-CM | POA: Diagnosis not present

## 2016-05-09 DIAGNOSIS — I6930 Unspecified sequelae of cerebral infarction: Secondary | ICD-10-CM | POA: Diagnosis not present

## 2016-05-09 DIAGNOSIS — F330 Major depressive disorder, recurrent, mild: Secondary | ICD-10-CM | POA: Diagnosis not present

## 2016-05-10 ENCOUNTER — Encounter: Payer: Self-pay | Admitting: *Deleted

## 2016-05-18 ENCOUNTER — Ambulatory Visit (INDEPENDENT_AMBULATORY_CARE_PROVIDER_SITE_OTHER): Payer: Medicare Other | Admitting: *Deleted

## 2016-05-18 DIAGNOSIS — Z853 Personal history of malignant neoplasm of breast: Secondary | ICD-10-CM

## 2016-05-18 DIAGNOSIS — Z85038 Personal history of other malignant neoplasm of large intestine: Secondary | ICD-10-CM

## 2016-05-18 DIAGNOSIS — Z8543 Personal history of malignant neoplasm of ovary: Secondary | ICD-10-CM

## 2016-05-18 NOTE — Patient Instructions (Signed)
The patient is aware to call back for any questions or concerns.  

## 2016-05-18 NOTE — Progress Notes (Addendum)
Patient ID: Christina Hahn, female   DOB: 02-15-1923, 82 y.o.   MRN: [id number]   Patient is here today for [facility] with her daughter, Celedonio Savage, Evalina Field to have genetic testing completed. Dr Bary Castilla has ordered the genetic testing for this patient. She has a known history of breast, ovarian and colon cancer.

## 2016-05-20 DIAGNOSIS — E039 Hypothyroidism, unspecified: Secondary | ICD-10-CM | POA: Diagnosis not present

## 2016-05-20 DIAGNOSIS — I10 Essential (primary) hypertension: Secondary | ICD-10-CM | POA: Diagnosis not present

## 2016-05-20 DIAGNOSIS — J45901 Unspecified asthma with (acute) exacerbation: Secondary | ICD-10-CM | POA: Diagnosis not present

## 2016-05-20 DIAGNOSIS — D649 Anemia, unspecified: Secondary | ICD-10-CM | POA: Diagnosis not present

## 2016-05-20 DIAGNOSIS — K219 Gastro-esophageal reflux disease without esophagitis: Secondary | ICD-10-CM | POA: Diagnosis not present

## 2016-05-20 DIAGNOSIS — K5900 Constipation, unspecified: Secondary | ICD-10-CM | POA: Diagnosis not present

## 2016-05-20 DIAGNOSIS — F0390 Unspecified dementia without behavioral disturbance: Secondary | ICD-10-CM | POA: Diagnosis not present

## 2016-05-23 DIAGNOSIS — Z1379 Encounter for other screening for genetic and chromosomal anomalies: Secondary | ICD-10-CM

## 2016-05-23 HISTORY — DX: Encounter for other screening for genetic and chromosomal anomalies: Z13.79

## 2016-05-24 ENCOUNTER — Encounter: Payer: Self-pay | Admitting: *Deleted

## 2016-05-24 ENCOUNTER — Telehealth: Payer: Self-pay | Admitting: *Deleted

## 2016-05-24 NOTE — Telephone Encounter (Signed)
Please inform daughter that the most recent genetic testing was negative. No indications for her to be tested per Dr Bary Castilla.

## 2016-05-25 ENCOUNTER — Telehealth: Payer: Self-pay | Admitting: *Deleted

## 2016-05-25 NOTE — Telephone Encounter (Signed)
Notified patient daughter as instructed, patient daughter pleased. Mailed copy.

## 2016-05-26 ENCOUNTER — Encounter: Payer: Self-pay | Admitting: General Surgery

## 2016-06-06 DIAGNOSIS — E039 Hypothyroidism, unspecified: Secondary | ICD-10-CM | POA: Diagnosis not present

## 2016-06-06 DIAGNOSIS — J0100 Acute maxillary sinusitis, unspecified: Secondary | ICD-10-CM | POA: Diagnosis not present

## 2016-06-06 DIAGNOSIS — K219 Gastro-esophageal reflux disease without esophagitis: Secondary | ICD-10-CM | POA: Diagnosis not present

## 2016-06-07 DIAGNOSIS — I2510 Atherosclerotic heart disease of native coronary artery without angina pectoris: Secondary | ICD-10-CM | POA: Diagnosis not present

## 2016-06-24 IMAGING — DX DG CHEST 2V
2 series · 2 of 2 positions shown · non-contrast
Comparison: Multiple prior studies including 11/23/2014

CLINICAL DATA: Hypoxia cold

EXAM:
CHEST  2 VIEW

[chest pa]
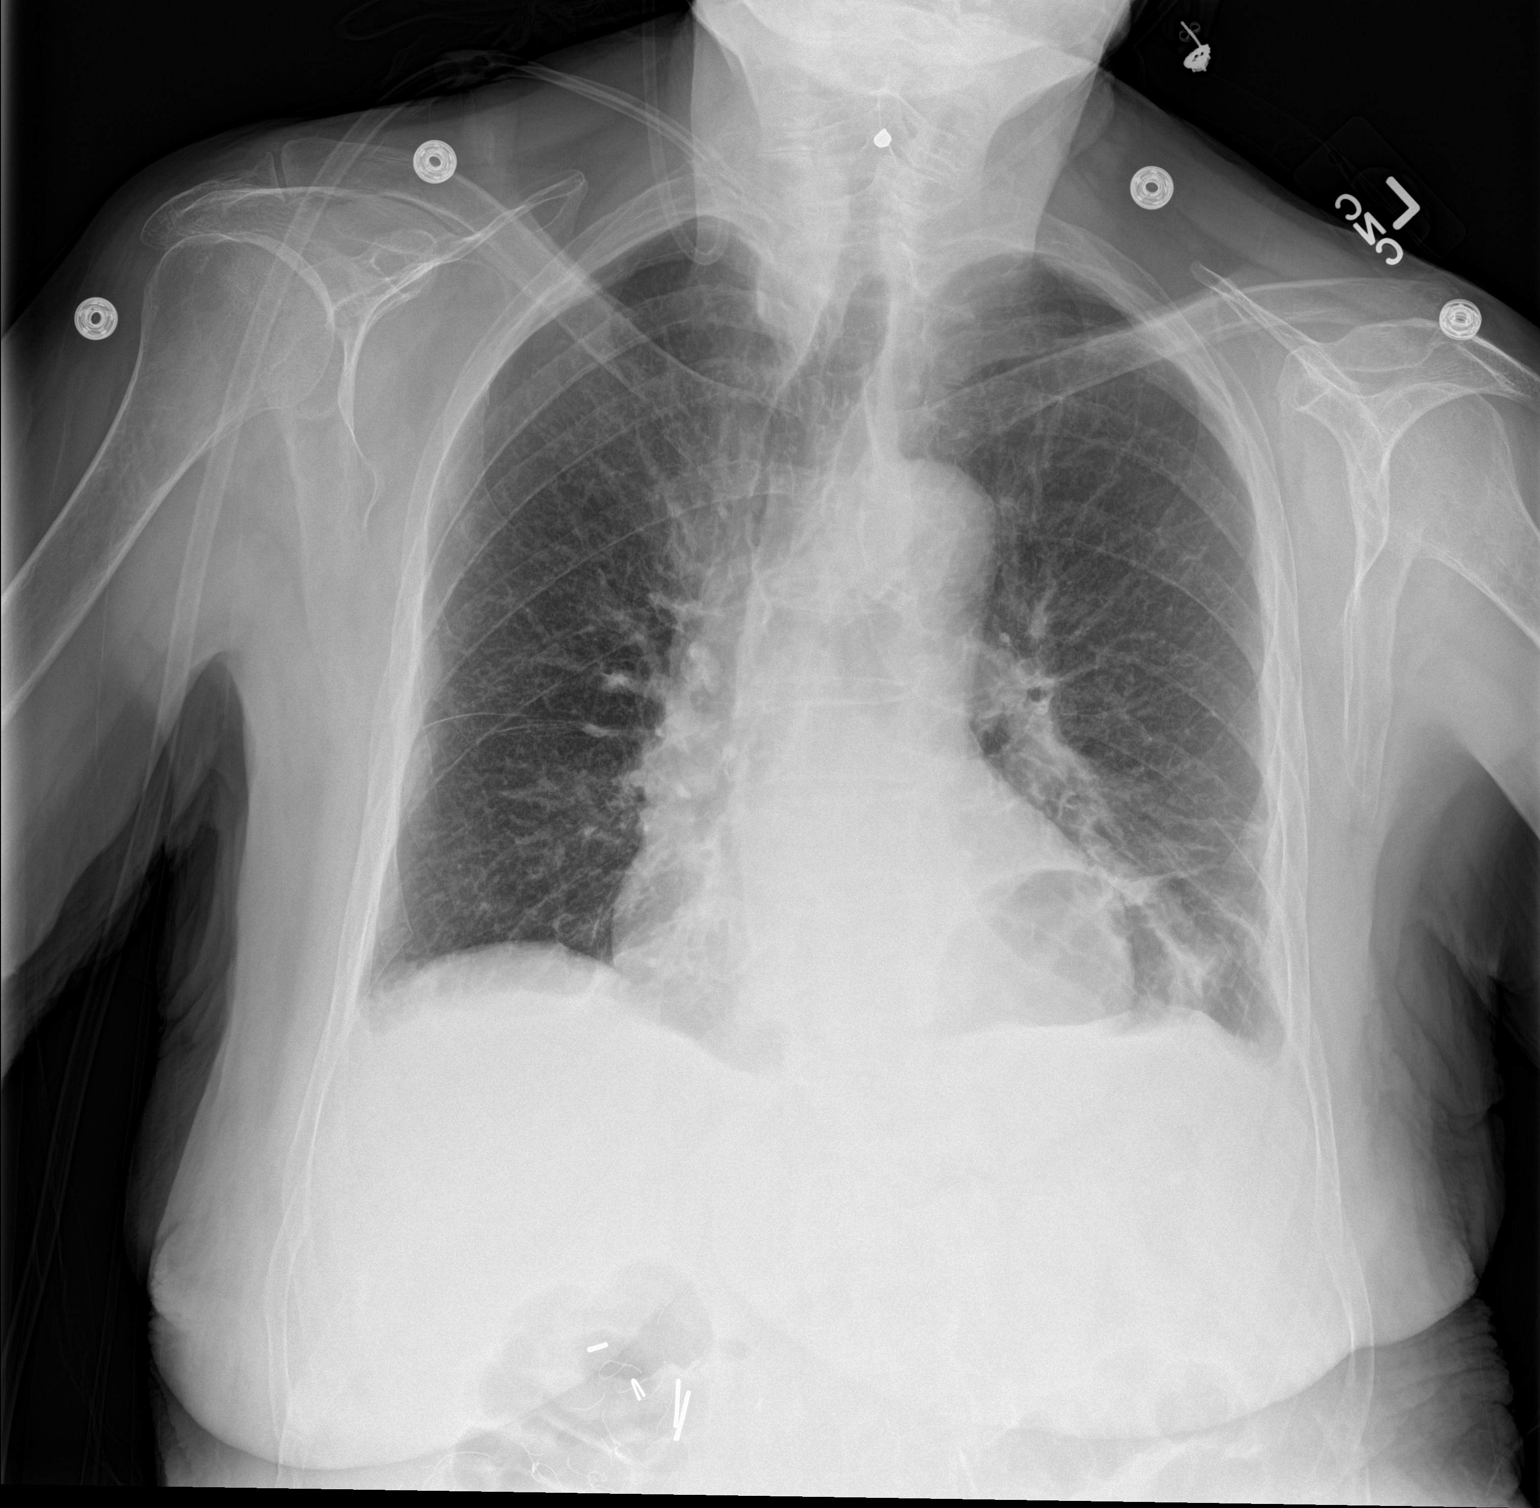

[chest lat]
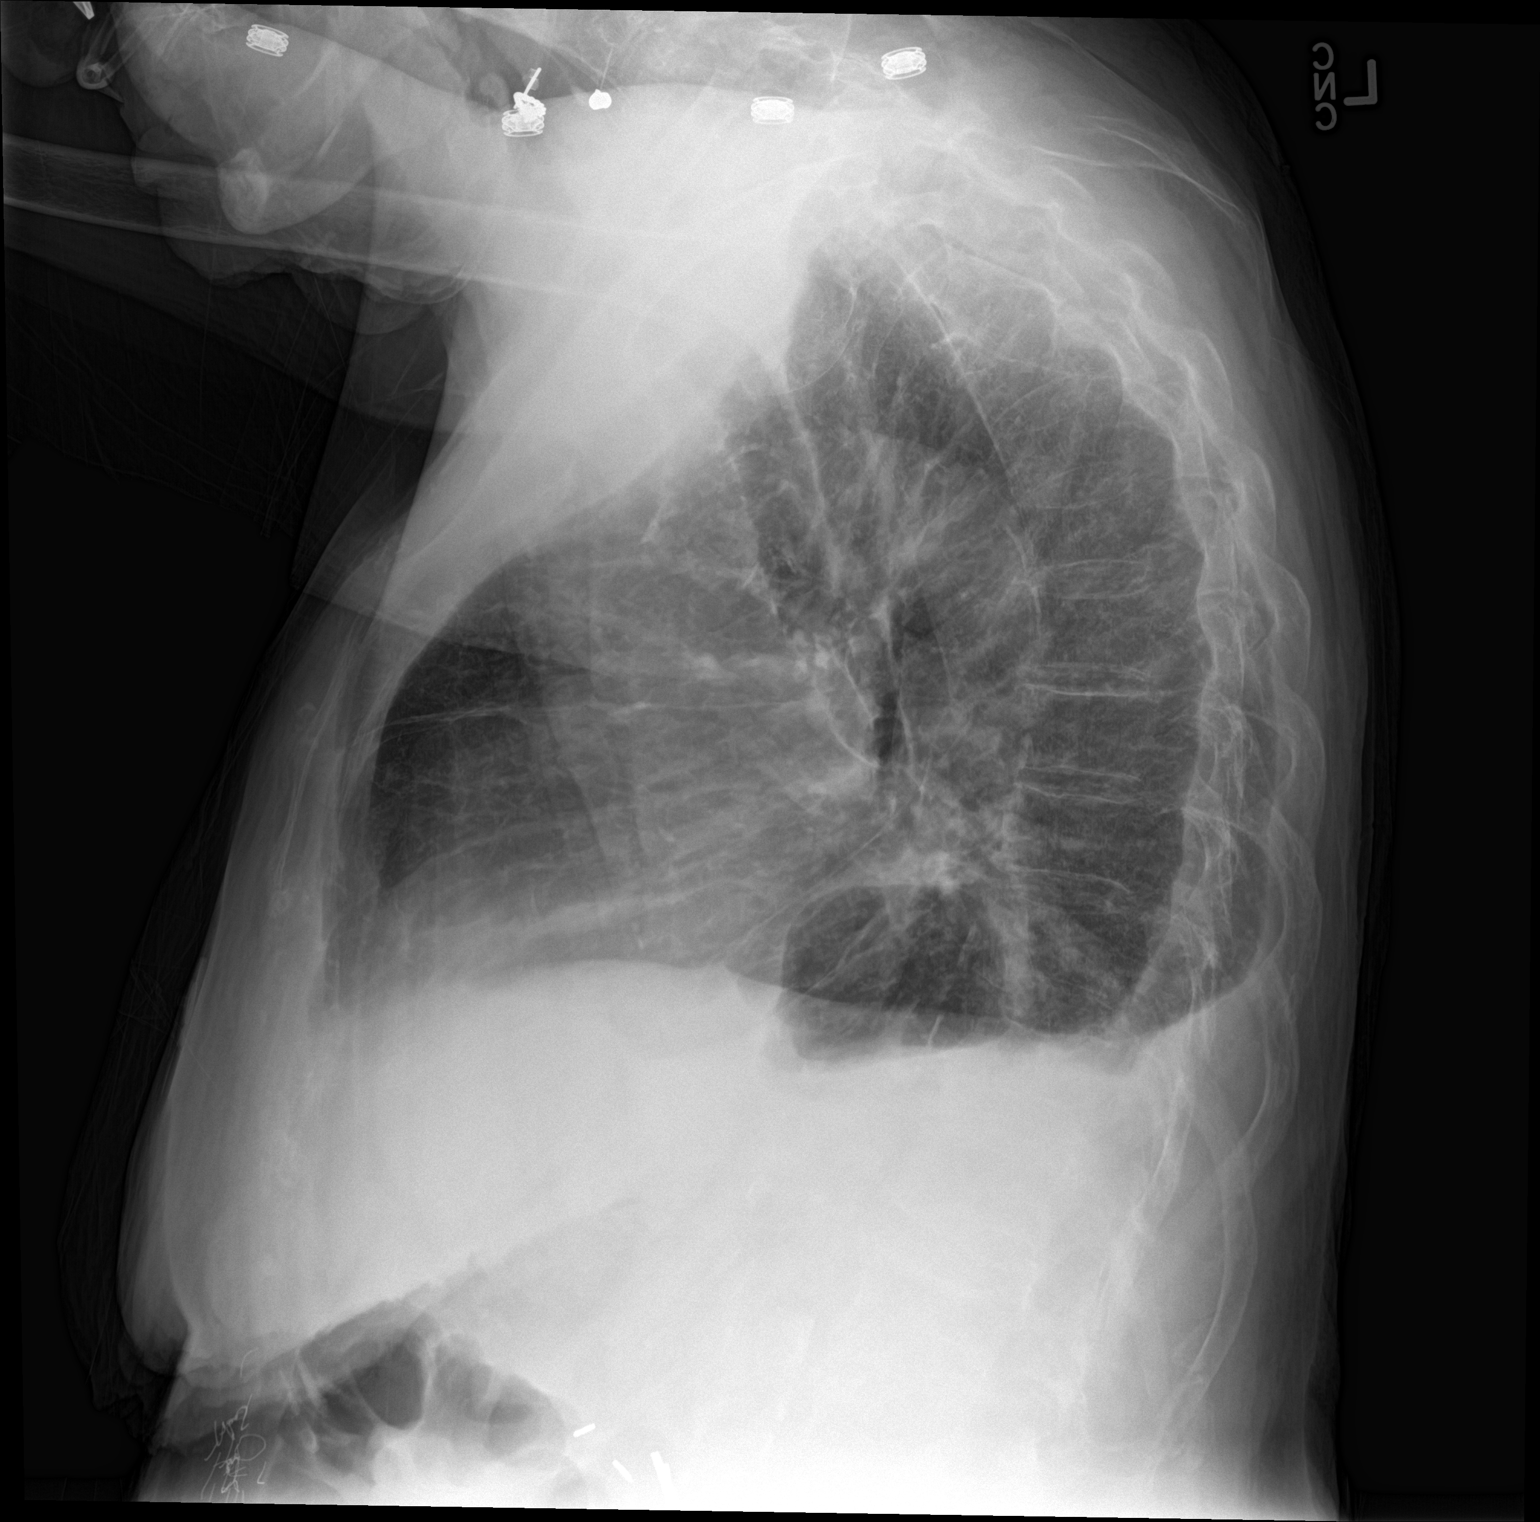

[2 of 2 positions shown; findings below may reference images not displayed]

FINDINGS: Mild cardiac enlargement stable. Vascular pattern within normal
limits. Right lung is clear. There are numerous linear opacities in
the left lower lobe which could again represent pneumonia. This is
mildly more prominent when compared the prior study. There is also
evidence of 5 cm rounded air-filled structure at the left base. This
could represent either hiatal hernia or diaphragmatic hernia or
eventration not appreciated on the prior study are multiple prior
studies.
IMPRESSION: Again identified is left lower lobe infiltrate concerning for
possible pneumonia with tiny associated effusion. Infiltrate is
mildly worse when compared to 11/23/2014.

Hiatal hernia versus diaphragmatic hernia or eventration.

## 2016-06-27 DIAGNOSIS — I2510 Atherosclerotic heart disease of native coronary artery without angina pectoris: Secondary | ICD-10-CM | POA: Diagnosis not present

## 2016-06-27 DIAGNOSIS — E038 Other specified hypothyroidism: Secondary | ICD-10-CM | POA: Diagnosis not present

## 2016-06-27 DIAGNOSIS — N3281 Overactive bladder: Secondary | ICD-10-CM | POA: Diagnosis not present

## 2016-06-27 DIAGNOSIS — B309 Viral conjunctivitis, unspecified: Secondary | ICD-10-CM | POA: Diagnosis not present

## 2016-06-27 DIAGNOSIS — M6281 Muscle weakness (generalized): Secondary | ICD-10-CM | POA: Diagnosis not present

## 2016-06-28 DIAGNOSIS — I2510 Atherosclerotic heart disease of native coronary artery without angina pectoris: Secondary | ICD-10-CM | POA: Diagnosis not present

## 2016-06-28 DIAGNOSIS — B309 Viral conjunctivitis, unspecified: Secondary | ICD-10-CM | POA: Diagnosis not present

## 2016-06-28 DIAGNOSIS — E038 Other specified hypothyroidism: Secondary | ICD-10-CM | POA: Diagnosis not present

## 2016-06-28 DIAGNOSIS — N3281 Overactive bladder: Secondary | ICD-10-CM | POA: Diagnosis not present

## 2016-06-28 DIAGNOSIS — M6281 Muscle weakness (generalized): Secondary | ICD-10-CM | POA: Diagnosis not present

## 2016-06-29 DIAGNOSIS — M6281 Muscle weakness (generalized): Secondary | ICD-10-CM | POA: Diagnosis not present

## 2016-06-29 DIAGNOSIS — N3281 Overactive bladder: Secondary | ICD-10-CM | POA: Diagnosis not present

## 2016-06-29 DIAGNOSIS — E038 Other specified hypothyroidism: Secondary | ICD-10-CM | POA: Diagnosis not present

## 2016-06-29 DIAGNOSIS — I2510 Atherosclerotic heart disease of native coronary artery without angina pectoris: Secondary | ICD-10-CM | POA: Diagnosis not present

## 2016-06-29 DIAGNOSIS — H02034 Senile entropion of left upper eyelid: Secondary | ICD-10-CM | POA: Diagnosis not present

## 2016-06-29 DIAGNOSIS — H353134 Nonexudative age-related macular degeneration, bilateral, advanced atrophic with subfoveal involvement: Secondary | ICD-10-CM | POA: Diagnosis not present

## 2016-06-29 DIAGNOSIS — B309 Viral conjunctivitis, unspecified: Secondary | ICD-10-CM | POA: Diagnosis not present

## 2016-06-30 DIAGNOSIS — B309 Viral conjunctivitis, unspecified: Secondary | ICD-10-CM | POA: Diagnosis not present

## 2016-06-30 DIAGNOSIS — E038 Other specified hypothyroidism: Secondary | ICD-10-CM | POA: Diagnosis not present

## 2016-06-30 DIAGNOSIS — M6281 Muscle weakness (generalized): Secondary | ICD-10-CM | POA: Diagnosis not present

## 2016-06-30 DIAGNOSIS — I2510 Atherosclerotic heart disease of native coronary artery without angina pectoris: Secondary | ICD-10-CM | POA: Diagnosis not present

## 2016-06-30 DIAGNOSIS — N3281 Overactive bladder: Secondary | ICD-10-CM | POA: Diagnosis not present

## 2016-07-03 DIAGNOSIS — N3281 Overactive bladder: Secondary | ICD-10-CM | POA: Diagnosis not present

## 2016-07-03 DIAGNOSIS — I2510 Atherosclerotic heart disease of native coronary artery without angina pectoris: Secondary | ICD-10-CM | POA: Diagnosis not present

## 2016-07-03 DIAGNOSIS — E038 Other specified hypothyroidism: Secondary | ICD-10-CM | POA: Diagnosis not present

## 2016-07-03 DIAGNOSIS — M6281 Muscle weakness (generalized): Secondary | ICD-10-CM | POA: Diagnosis not present

## 2016-07-03 DIAGNOSIS — B309 Viral conjunctivitis, unspecified: Secondary | ICD-10-CM | POA: Diagnosis not present

## 2016-07-04 DIAGNOSIS — B309 Viral conjunctivitis, unspecified: Secondary | ICD-10-CM | POA: Diagnosis not present

## 2016-07-04 DIAGNOSIS — N3281 Overactive bladder: Secondary | ICD-10-CM | POA: Diagnosis not present

## 2016-07-04 DIAGNOSIS — I2510 Atherosclerotic heart disease of native coronary artery without angina pectoris: Secondary | ICD-10-CM | POA: Diagnosis not present

## 2016-07-04 DIAGNOSIS — M6281 Muscle weakness (generalized): Secondary | ICD-10-CM | POA: Diagnosis not present

## 2016-07-04 DIAGNOSIS — E038 Other specified hypothyroidism: Secondary | ICD-10-CM | POA: Diagnosis not present

## 2016-07-05 DIAGNOSIS — I2510 Atherosclerotic heart disease of native coronary artery without angina pectoris: Secondary | ICD-10-CM | POA: Diagnosis not present

## 2016-07-05 DIAGNOSIS — E038 Other specified hypothyroidism: Secondary | ICD-10-CM | POA: Diagnosis not present

## 2016-07-05 DIAGNOSIS — N3281 Overactive bladder: Secondary | ICD-10-CM | POA: Diagnosis not present

## 2016-07-05 DIAGNOSIS — M6281 Muscle weakness (generalized): Secondary | ICD-10-CM | POA: Diagnosis not present

## 2016-07-05 DIAGNOSIS — B309 Viral conjunctivitis, unspecified: Secondary | ICD-10-CM | POA: Diagnosis not present

## 2016-07-06 DIAGNOSIS — F419 Anxiety disorder, unspecified: Secondary | ICD-10-CM | POA: Diagnosis not present

## 2016-07-06 DIAGNOSIS — E038 Other specified hypothyroidism: Secondary | ICD-10-CM | POA: Diagnosis not present

## 2016-07-06 DIAGNOSIS — N3281 Overactive bladder: Secondary | ICD-10-CM | POA: Diagnosis not present

## 2016-07-06 DIAGNOSIS — F330 Major depressive disorder, recurrent, mild: Secondary | ICD-10-CM | POA: Diagnosis not present

## 2016-07-06 DIAGNOSIS — M6281 Muscle weakness (generalized): Secondary | ICD-10-CM | POA: Diagnosis not present

## 2016-07-06 DIAGNOSIS — B309 Viral conjunctivitis, unspecified: Secondary | ICD-10-CM | POA: Diagnosis not present

## 2016-07-06 DIAGNOSIS — I2510 Atherosclerotic heart disease of native coronary artery without angina pectoris: Secondary | ICD-10-CM | POA: Diagnosis not present

## 2016-07-06 DIAGNOSIS — I6930 Unspecified sequelae of cerebral infarction: Secondary | ICD-10-CM | POA: Diagnosis not present

## 2016-07-06 DIAGNOSIS — E538 Deficiency of other specified B group vitamins: Secondary | ICD-10-CM | POA: Diagnosis not present

## 2016-07-07 DIAGNOSIS — E038 Other specified hypothyroidism: Secondary | ICD-10-CM | POA: Diagnosis not present

## 2016-07-07 DIAGNOSIS — I2510 Atherosclerotic heart disease of native coronary artery without angina pectoris: Secondary | ICD-10-CM | POA: Diagnosis not present

## 2016-07-07 DIAGNOSIS — N3281 Overactive bladder: Secondary | ICD-10-CM | POA: Diagnosis not present

## 2016-07-07 DIAGNOSIS — M6281 Muscle weakness (generalized): Secondary | ICD-10-CM | POA: Diagnosis not present

## 2016-07-07 DIAGNOSIS — B309 Viral conjunctivitis, unspecified: Secondary | ICD-10-CM | POA: Diagnosis not present

## 2016-07-10 DIAGNOSIS — M6281 Muscle weakness (generalized): Secondary | ICD-10-CM | POA: Diagnosis not present

## 2016-07-10 DIAGNOSIS — E038 Other specified hypothyroidism: Secondary | ICD-10-CM | POA: Diagnosis not present

## 2016-07-10 DIAGNOSIS — I2510 Atherosclerotic heart disease of native coronary artery without angina pectoris: Secondary | ICD-10-CM | POA: Diagnosis not present

## 2016-07-10 DIAGNOSIS — N3281 Overactive bladder: Secondary | ICD-10-CM | POA: Diagnosis not present

## 2016-07-10 DIAGNOSIS — B309 Viral conjunctivitis, unspecified: Secondary | ICD-10-CM | POA: Diagnosis not present

## 2016-07-11 ENCOUNTER — Other Ambulatory Visit
Admission: RE | Admit: 2016-07-11 | Discharge: 2016-07-11 | Disposition: A | Discharge status: Home / Self Care | Payer: Medicare Other | Source: Ambulatory Visit | Attending: Family Medicine | Admitting: Family Medicine

## 2016-07-11 DIAGNOSIS — J111 Influenza due to unidentified influenza virus with other respiratory manifestations: Secondary | ICD-10-CM | POA: Diagnosis not present

## 2016-07-11 DIAGNOSIS — I2510 Atherosclerotic heart disease of native coronary artery without angina pectoris: Secondary | ICD-10-CM | POA: Diagnosis not present

## 2016-07-11 DIAGNOSIS — B309 Viral conjunctivitis, unspecified: Secondary | ICD-10-CM | POA: Diagnosis not present

## 2016-07-11 DIAGNOSIS — N3281 Overactive bladder: Secondary | ICD-10-CM | POA: Diagnosis not present

## 2016-07-11 DIAGNOSIS — E038 Other specified hypothyroidism: Secondary | ICD-10-CM | POA: Diagnosis not present

## 2016-07-11 DIAGNOSIS — M6281 Muscle weakness (generalized): Secondary | ICD-10-CM | POA: Diagnosis not present

## 2016-07-11 LAB — RAPID INFLUENZA A&B ANTIGENS
Influenza A (ARMC): NEGATIVE
Influenza B (ARMC): NEGATIVE

## 2016-07-12 DIAGNOSIS — B309 Viral conjunctivitis, unspecified: Secondary | ICD-10-CM | POA: Diagnosis not present

## 2016-07-12 DIAGNOSIS — E038 Other specified hypothyroidism: Secondary | ICD-10-CM | POA: Diagnosis not present

## 2016-07-12 DIAGNOSIS — M6281 Muscle weakness (generalized): Secondary | ICD-10-CM | POA: Diagnosis not present

## 2016-07-12 DIAGNOSIS — I2510 Atherosclerotic heart disease of native coronary artery without angina pectoris: Secondary | ICD-10-CM | POA: Diagnosis not present

## 2016-07-12 DIAGNOSIS — N3281 Overactive bladder: Secondary | ICD-10-CM | POA: Diagnosis not present

## 2016-07-13 DIAGNOSIS — M6281 Muscle weakness (generalized): Secondary | ICD-10-CM | POA: Diagnosis not present

## 2016-07-13 DIAGNOSIS — B309 Viral conjunctivitis, unspecified: Secondary | ICD-10-CM | POA: Diagnosis not present

## 2016-07-13 DIAGNOSIS — I2510 Atherosclerotic heart disease of native coronary artery without angina pectoris: Secondary | ICD-10-CM | POA: Diagnosis not present

## 2016-07-13 DIAGNOSIS — E038 Other specified hypothyroidism: Secondary | ICD-10-CM | POA: Diagnosis not present

## 2016-07-13 DIAGNOSIS — N3281 Overactive bladder: Secondary | ICD-10-CM | POA: Diagnosis not present

## 2016-07-14 DIAGNOSIS — E038 Other specified hypothyroidism: Secondary | ICD-10-CM | POA: Diagnosis not present

## 2016-07-14 DIAGNOSIS — M6281 Muscle weakness (generalized): Secondary | ICD-10-CM | POA: Diagnosis not present

## 2016-07-14 DIAGNOSIS — N3281 Overactive bladder: Secondary | ICD-10-CM | POA: Diagnosis not present

## 2016-07-14 DIAGNOSIS — I2510 Atherosclerotic heart disease of native coronary artery without angina pectoris: Secondary | ICD-10-CM | POA: Diagnosis not present

## 2016-07-14 DIAGNOSIS — B309 Viral conjunctivitis, unspecified: Secondary | ICD-10-CM | POA: Diagnosis not present

## 2016-07-17 DIAGNOSIS — I2510 Atherosclerotic heart disease of native coronary artery without angina pectoris: Secondary | ICD-10-CM | POA: Diagnosis not present

## 2016-07-17 DIAGNOSIS — M6281 Muscle weakness (generalized): Secondary | ICD-10-CM | POA: Diagnosis not present

## 2016-07-17 DIAGNOSIS — B309 Viral conjunctivitis, unspecified: Secondary | ICD-10-CM | POA: Diagnosis not present

## 2016-07-17 DIAGNOSIS — N3281 Overactive bladder: Secondary | ICD-10-CM | POA: Diagnosis not present

## 2016-07-17 DIAGNOSIS — E038 Other specified hypothyroidism: Secondary | ICD-10-CM | POA: Diagnosis not present

## 2016-07-18 DIAGNOSIS — B309 Viral conjunctivitis, unspecified: Secondary | ICD-10-CM | POA: Diagnosis not present

## 2016-07-18 DIAGNOSIS — I2510 Atherosclerotic heart disease of native coronary artery without angina pectoris: Secondary | ICD-10-CM | POA: Diagnosis not present

## 2016-07-18 DIAGNOSIS — E038 Other specified hypothyroidism: Secondary | ICD-10-CM | POA: Diagnosis not present

## 2016-07-18 DIAGNOSIS — M6281 Muscle weakness (generalized): Secondary | ICD-10-CM | POA: Diagnosis not present

## 2016-07-18 DIAGNOSIS — N3281 Overactive bladder: Secondary | ICD-10-CM | POA: Diagnosis not present

## 2016-07-19 DIAGNOSIS — B309 Viral conjunctivitis, unspecified: Secondary | ICD-10-CM | POA: Diagnosis not present

## 2016-07-19 DIAGNOSIS — N3281 Overactive bladder: Secondary | ICD-10-CM | POA: Diagnosis not present

## 2016-07-19 DIAGNOSIS — I2510 Atherosclerotic heart disease of native coronary artery without angina pectoris: Secondary | ICD-10-CM | POA: Diagnosis not present

## 2016-07-19 DIAGNOSIS — M6281 Muscle weakness (generalized): Secondary | ICD-10-CM | POA: Diagnosis not present

## 2016-07-19 DIAGNOSIS — E038 Other specified hypothyroidism: Secondary | ICD-10-CM | POA: Diagnosis not present

## 2016-07-20 DIAGNOSIS — R41841 Cognitive communication deficit: Secondary | ICD-10-CM | POA: Diagnosis not present

## 2016-07-20 DIAGNOSIS — N3281 Overactive bladder: Secondary | ICD-10-CM | POA: Diagnosis not present

## 2016-07-20 DIAGNOSIS — M6281 Muscle weakness (generalized): Secondary | ICD-10-CM | POA: Diagnosis not present

## 2016-07-20 DIAGNOSIS — R1312 Dysphagia, oropharyngeal phase: Secondary | ICD-10-CM | POA: Diagnosis not present

## 2016-07-20 DIAGNOSIS — B309 Viral conjunctivitis, unspecified: Secondary | ICD-10-CM | POA: Diagnosis not present

## 2016-07-20 DIAGNOSIS — I2510 Atherosclerotic heart disease of native coronary artery without angina pectoris: Secondary | ICD-10-CM | POA: Diagnosis not present

## 2016-07-20 DIAGNOSIS — E038 Other specified hypothyroidism: Secondary | ICD-10-CM | POA: Diagnosis not present

## 2016-07-21 DIAGNOSIS — B309 Viral conjunctivitis, unspecified: Secondary | ICD-10-CM | POA: Diagnosis not present

## 2016-07-21 DIAGNOSIS — I2510 Atherosclerotic heart disease of native coronary artery without angina pectoris: Secondary | ICD-10-CM | POA: Diagnosis not present

## 2016-07-21 DIAGNOSIS — N3281 Overactive bladder: Secondary | ICD-10-CM | POA: Diagnosis not present

## 2016-07-21 DIAGNOSIS — R41841 Cognitive communication deficit: Secondary | ICD-10-CM | POA: Diagnosis not present

## 2016-07-21 DIAGNOSIS — R1312 Dysphagia, oropharyngeal phase: Secondary | ICD-10-CM | POA: Diagnosis not present

## 2016-07-21 DIAGNOSIS — M6281 Muscle weakness (generalized): Secondary | ICD-10-CM | POA: Diagnosis not present

## 2016-07-27 DIAGNOSIS — F0390 Unspecified dementia without behavioral disturbance: Secondary | ICD-10-CM | POA: Diagnosis not present

## 2016-07-27 DIAGNOSIS — M810 Age-related osteoporosis without current pathological fracture: Secondary | ICD-10-CM | POA: Diagnosis not present

## 2016-07-27 DIAGNOSIS — N3281 Overactive bladder: Secondary | ICD-10-CM | POA: Diagnosis not present

## 2016-07-27 DIAGNOSIS — J45909 Unspecified asthma, uncomplicated: Secondary | ICD-10-CM | POA: Diagnosis not present

## 2016-07-27 DIAGNOSIS — D519 Vitamin B12 deficiency anemia, unspecified: Secondary | ICD-10-CM | POA: Diagnosis not present

## 2016-07-27 DIAGNOSIS — K5900 Constipation, unspecified: Secondary | ICD-10-CM | POA: Diagnosis not present

## 2016-07-27 DIAGNOSIS — E039 Hypothyroidism, unspecified: Secondary | ICD-10-CM | POA: Diagnosis not present

## 2016-07-27 DIAGNOSIS — I10 Essential (primary) hypertension: Secondary | ICD-10-CM | POA: Diagnosis not present

## 2016-07-28 DIAGNOSIS — L98419 Non-pressure chronic ulcer of buttock with unspecified severity: Secondary | ICD-10-CM | POA: Diagnosis not present

## 2016-08-02 DIAGNOSIS — E039 Hypothyroidism, unspecified: Secondary | ICD-10-CM | POA: Diagnosis not present

## 2016-08-02 IMAGING — CR DG CHEST 2V
1 series · 2 of 2 positions shown · non-contrast
Comparison: PA and lateral chest x-ray November 26, 2014.

CLINICAL DATA: One-week history of pneumonia, weakness, and
unsteadiness. history of asthma-COPD, CVA, and previous cardiac
surgery.

EXAM:
CHEST  2 VIEW

[Series 1: dg chest 2 view · 0.14mm/px · 2 of 2 slices shown]
[im 1/2]
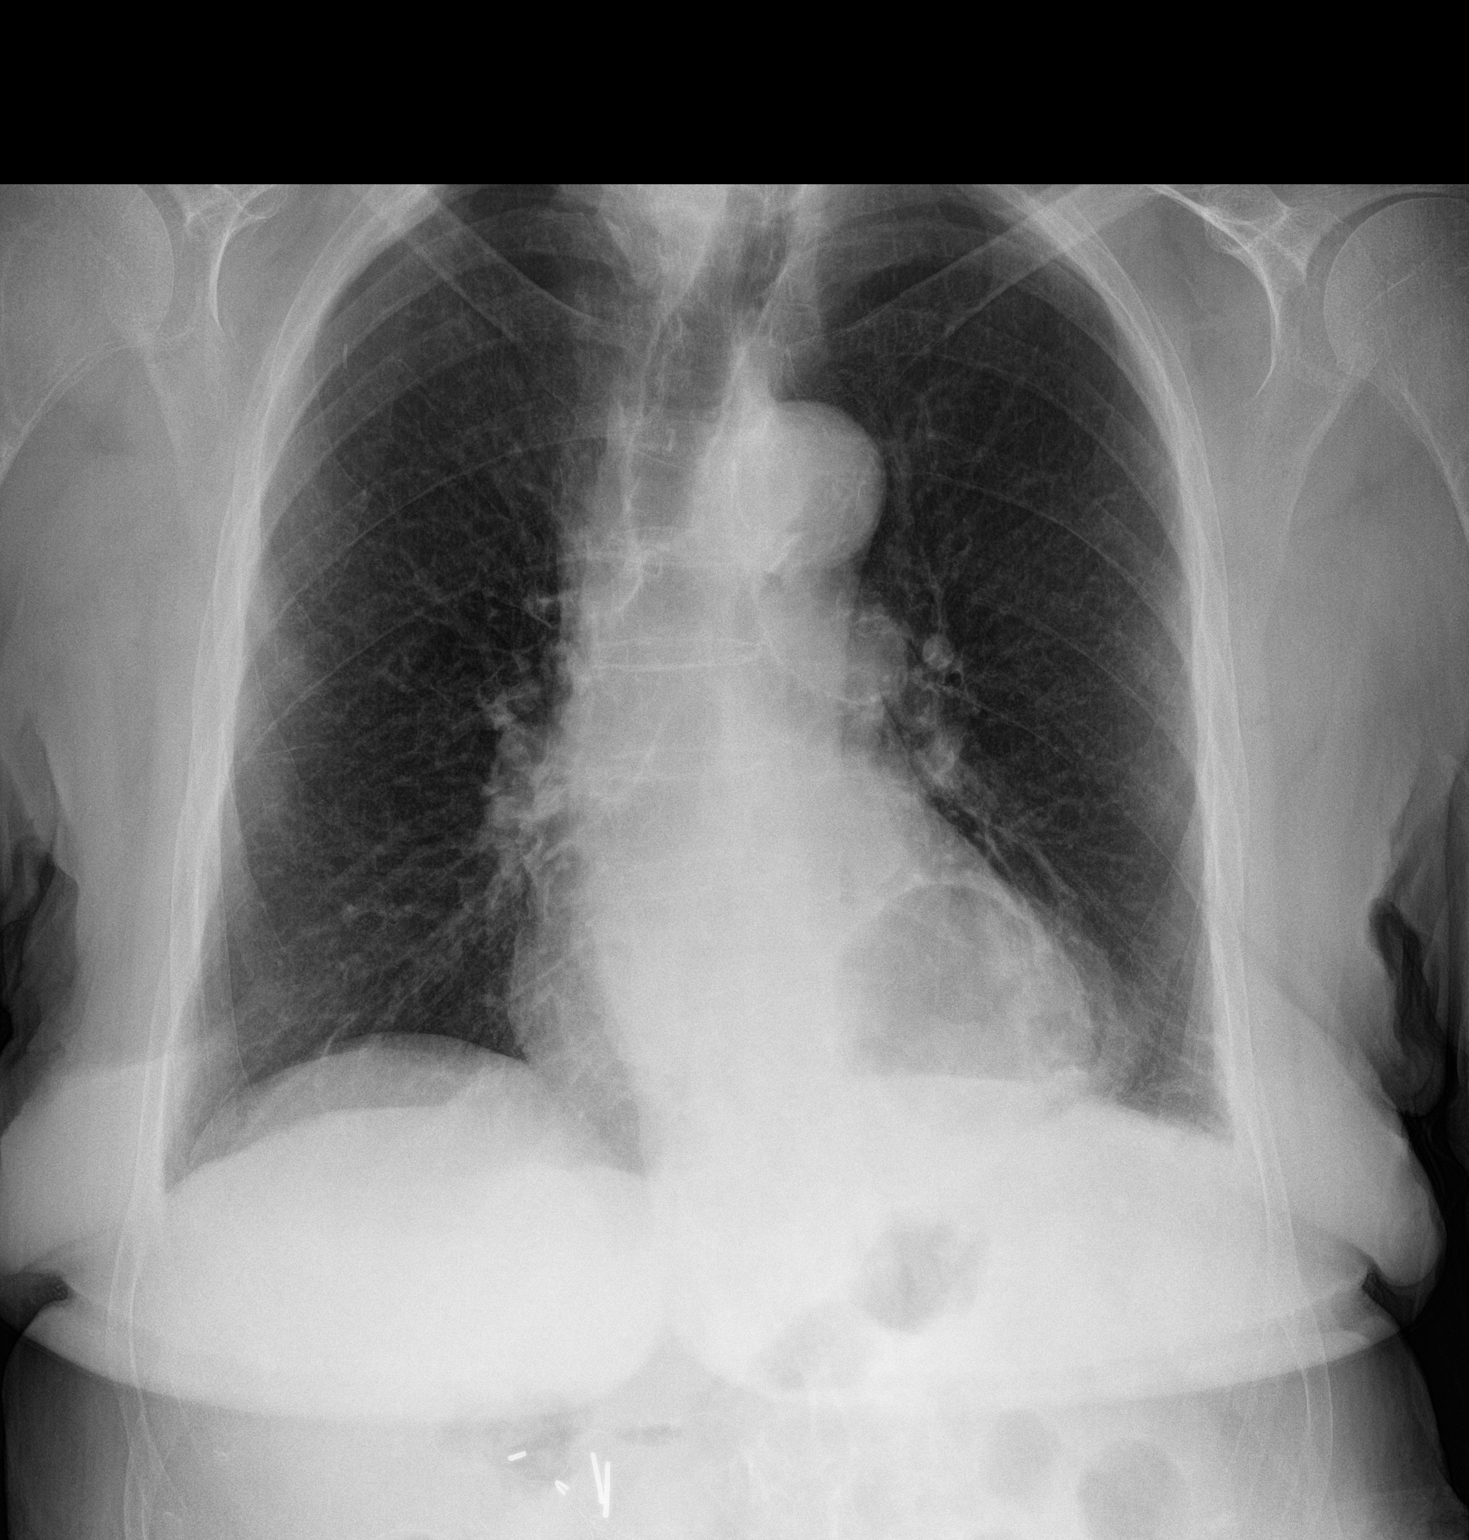
[im 2/2]
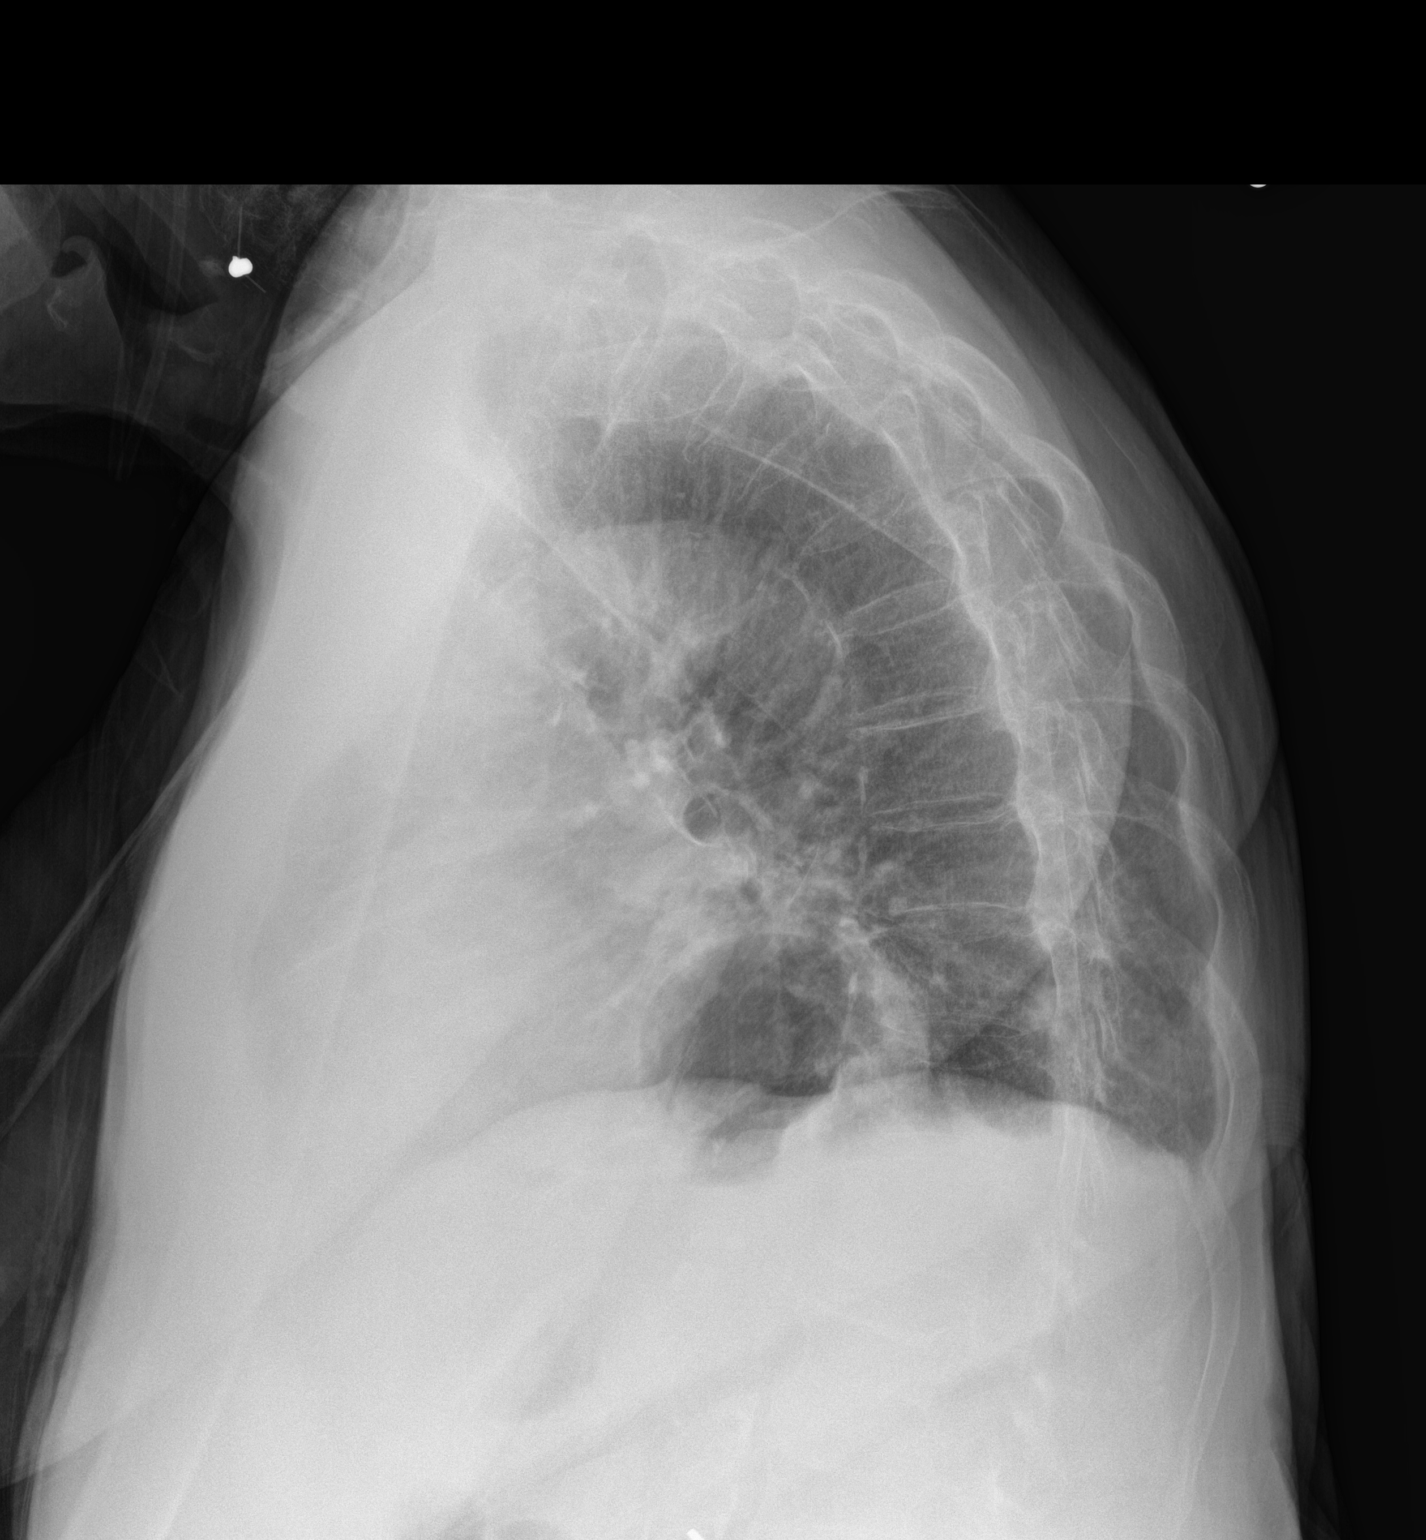

[2 of 2 positions shown; findings below may reference images not displayed]

FINDINGS: The lungs are hyperinflated. There is a moderate-sized hiatal hernia
containing fluid and air. There is no alveolar infiltrate. There is
no significant pleural effusion. The heart is normal in size. The
pulmonary vascularity is not engorged. There is mild dextrocurvature
centered in the mid thoracic spine.
IMPRESSION: COPD. Interval clearing of the infiltrate or atelectasis at the left
lung base. There remains increased density here likely secondary to
the large hiatal hernia.

## 2016-08-04 DIAGNOSIS — L98419 Non-pressure chronic ulcer of buttock with unspecified severity: Secondary | ICD-10-CM | POA: Diagnosis not present

## 2016-08-07 DIAGNOSIS — R41841 Cognitive communication deficit: Secondary | ICD-10-CM | POA: Diagnosis not present

## 2016-08-07 DIAGNOSIS — R1312 Dysphagia, oropharyngeal phase: Secondary | ICD-10-CM | POA: Diagnosis not present

## 2016-08-07 DIAGNOSIS — M6281 Muscle weakness (generalized): Secondary | ICD-10-CM | POA: Diagnosis not present

## 2016-08-07 DIAGNOSIS — I2510 Atherosclerotic heart disease of native coronary artery without angina pectoris: Secondary | ICD-10-CM | POA: Diagnosis not present

## 2016-08-07 DIAGNOSIS — N3281 Overactive bladder: Secondary | ICD-10-CM | POA: Diagnosis not present

## 2016-08-07 DIAGNOSIS — B309 Viral conjunctivitis, unspecified: Secondary | ICD-10-CM | POA: Diagnosis not present

## 2016-08-08 DIAGNOSIS — N3281 Overactive bladder: Secondary | ICD-10-CM | POA: Diagnosis not present

## 2016-08-08 DIAGNOSIS — M6281 Muscle weakness (generalized): Secondary | ICD-10-CM | POA: Diagnosis not present

## 2016-08-08 DIAGNOSIS — B309 Viral conjunctivitis, unspecified: Secondary | ICD-10-CM | POA: Diagnosis not present

## 2016-08-08 DIAGNOSIS — I2510 Atherosclerotic heart disease of native coronary artery without angina pectoris: Secondary | ICD-10-CM | POA: Diagnosis not present

## 2016-08-08 DIAGNOSIS — R41841 Cognitive communication deficit: Secondary | ICD-10-CM | POA: Diagnosis not present

## 2016-08-08 DIAGNOSIS — R1312 Dysphagia, oropharyngeal phase: Secondary | ICD-10-CM | POA: Diagnosis not present

## 2016-08-09 DIAGNOSIS — B309 Viral conjunctivitis, unspecified: Secondary | ICD-10-CM | POA: Diagnosis not present

## 2016-08-09 DIAGNOSIS — I2510 Atherosclerotic heart disease of native coronary artery without angina pectoris: Secondary | ICD-10-CM | POA: Diagnosis not present

## 2016-08-09 DIAGNOSIS — N3281 Overactive bladder: Secondary | ICD-10-CM | POA: Diagnosis not present

## 2016-08-09 DIAGNOSIS — R41841 Cognitive communication deficit: Secondary | ICD-10-CM | POA: Diagnosis not present

## 2016-08-09 DIAGNOSIS — M6281 Muscle weakness (generalized): Secondary | ICD-10-CM | POA: Diagnosis not present

## 2016-08-09 DIAGNOSIS — R1312 Dysphagia, oropharyngeal phase: Secondary | ICD-10-CM | POA: Diagnosis not present

## 2016-08-10 DIAGNOSIS — M6281 Muscle weakness (generalized): Secondary | ICD-10-CM | POA: Diagnosis not present

## 2016-08-10 DIAGNOSIS — I2510 Atherosclerotic heart disease of native coronary artery without angina pectoris: Secondary | ICD-10-CM | POA: Diagnosis not present

## 2016-08-10 DIAGNOSIS — R41841 Cognitive communication deficit: Secondary | ICD-10-CM | POA: Diagnosis not present

## 2016-08-10 DIAGNOSIS — N3281 Overactive bladder: Secondary | ICD-10-CM | POA: Diagnosis not present

## 2016-08-10 DIAGNOSIS — R1312 Dysphagia, oropharyngeal phase: Secondary | ICD-10-CM | POA: Diagnosis not present

## 2016-08-10 DIAGNOSIS — B309 Viral conjunctivitis, unspecified: Secondary | ICD-10-CM | POA: Diagnosis not present

## 2016-08-11 DIAGNOSIS — R41841 Cognitive communication deficit: Secondary | ICD-10-CM | POA: Diagnosis not present

## 2016-08-11 DIAGNOSIS — I2510 Atherosclerotic heart disease of native coronary artery without angina pectoris: Secondary | ICD-10-CM | POA: Diagnosis not present

## 2016-08-11 DIAGNOSIS — N3281 Overactive bladder: Secondary | ICD-10-CM | POA: Diagnosis not present

## 2016-08-11 DIAGNOSIS — M6281 Muscle weakness (generalized): Secondary | ICD-10-CM | POA: Diagnosis not present

## 2016-08-11 DIAGNOSIS — B309 Viral conjunctivitis, unspecified: Secondary | ICD-10-CM | POA: Diagnosis not present

## 2016-08-11 DIAGNOSIS — R1312 Dysphagia, oropharyngeal phase: Secondary | ICD-10-CM | POA: Diagnosis not present

## 2016-08-14 DIAGNOSIS — M6281 Muscle weakness (generalized): Secondary | ICD-10-CM | POA: Diagnosis not present

## 2016-08-14 DIAGNOSIS — I2510 Atherosclerotic heart disease of native coronary artery without angina pectoris: Secondary | ICD-10-CM | POA: Diagnosis not present

## 2016-08-14 DIAGNOSIS — R1312 Dysphagia, oropharyngeal phase: Secondary | ICD-10-CM | POA: Diagnosis not present

## 2016-08-14 DIAGNOSIS — B309 Viral conjunctivitis, unspecified: Secondary | ICD-10-CM | POA: Diagnosis not present

## 2016-08-14 DIAGNOSIS — N3281 Overactive bladder: Secondary | ICD-10-CM | POA: Diagnosis not present

## 2016-08-14 DIAGNOSIS — R41841 Cognitive communication deficit: Secondary | ICD-10-CM | POA: Diagnosis not present

## 2016-08-15 DIAGNOSIS — N3281 Overactive bladder: Secondary | ICD-10-CM | POA: Diagnosis not present

## 2016-08-15 DIAGNOSIS — B309 Viral conjunctivitis, unspecified: Secondary | ICD-10-CM | POA: Diagnosis not present

## 2016-08-15 DIAGNOSIS — M6281 Muscle weakness (generalized): Secondary | ICD-10-CM | POA: Diagnosis not present

## 2016-08-15 DIAGNOSIS — R41841 Cognitive communication deficit: Secondary | ICD-10-CM | POA: Diagnosis not present

## 2016-08-15 DIAGNOSIS — I2510 Atherosclerotic heart disease of native coronary artery without angina pectoris: Secondary | ICD-10-CM | POA: Diagnosis not present

## 2016-08-15 DIAGNOSIS — R1312 Dysphagia, oropharyngeal phase: Secondary | ICD-10-CM | POA: Diagnosis not present

## 2016-08-16 DIAGNOSIS — M6281 Muscle weakness (generalized): Secondary | ICD-10-CM | POA: Diagnosis not present

## 2016-08-16 DIAGNOSIS — B309 Viral conjunctivitis, unspecified: Secondary | ICD-10-CM | POA: Diagnosis not present

## 2016-08-16 DIAGNOSIS — I2510 Atherosclerotic heart disease of native coronary artery without angina pectoris: Secondary | ICD-10-CM | POA: Diagnosis not present

## 2016-08-16 DIAGNOSIS — R41841 Cognitive communication deficit: Secondary | ICD-10-CM | POA: Diagnosis not present

## 2016-08-16 DIAGNOSIS — N3281 Overactive bladder: Secondary | ICD-10-CM | POA: Diagnosis not present

## 2016-08-16 DIAGNOSIS — R1312 Dysphagia, oropharyngeal phase: Secondary | ICD-10-CM | POA: Diagnosis not present

## 2016-08-30 DIAGNOSIS — F419 Anxiety disorder, unspecified: Secondary | ICD-10-CM | POA: Diagnosis not present

## 2016-08-30 DIAGNOSIS — F330 Major depressive disorder, recurrent, mild: Secondary | ICD-10-CM | POA: Diagnosis not present

## 2016-08-30 DIAGNOSIS — E538 Deficiency of other specified B group vitamins: Secondary | ICD-10-CM | POA: Diagnosis not present

## 2016-08-30 DIAGNOSIS — I6930 Unspecified sequelae of cerebral infarction: Secondary | ICD-10-CM | POA: Diagnosis not present

## 2016-09-01 DIAGNOSIS — D510 Vitamin B12 deficiency anemia due to intrinsic factor deficiency: Secondary | ICD-10-CM | POA: Diagnosis not present

## 2016-09-04 DIAGNOSIS — D631 Anemia in chronic kidney disease: Secondary | ICD-10-CM | POA: Diagnosis not present

## 2016-09-04 DIAGNOSIS — R809 Proteinuria, unspecified: Secondary | ICD-10-CM | POA: Diagnosis not present

## 2016-09-04 DIAGNOSIS — R600 Localized edema: Secondary | ICD-10-CM | POA: Diagnosis not present

## 2016-09-04 DIAGNOSIS — I10 Essential (primary) hypertension: Secondary | ICD-10-CM | POA: Diagnosis not present

## 2016-09-04 DIAGNOSIS — N2581 Secondary hyperparathyroidism of renal origin: Secondary | ICD-10-CM | POA: Diagnosis not present

## 2016-09-04 DIAGNOSIS — N183 Chronic kidney disease, stage 3 (moderate): Secondary | ICD-10-CM | POA: Diagnosis not present

## 2016-10-10 DIAGNOSIS — E119 Type 2 diabetes mellitus without complications: Secondary | ICD-10-CM | POA: Diagnosis not present

## 2016-11-28 DIAGNOSIS — F0390 Unspecified dementia without behavioral disturbance: Secondary | ICD-10-CM | POA: Diagnosis not present

## 2016-12-25 DIAGNOSIS — R262 Difficulty in walking, not elsewhere classified: Secondary | ICD-10-CM | POA: Diagnosis not present

## 2016-12-25 DIAGNOSIS — B351 Tinea unguium: Secondary | ICD-10-CM | POA: Diagnosis not present

## 2016-12-25 DIAGNOSIS — I739 Peripheral vascular disease, unspecified: Secondary | ICD-10-CM | POA: Diagnosis not present

## 2016-12-25 DIAGNOSIS — R600 Localized edema: Secondary | ICD-10-CM | POA: Diagnosis not present

## 2017-01-02 DIAGNOSIS — H353134 Nonexudative age-related macular degeneration, bilateral, advanced atrophic with subfoveal involvement: Secondary | ICD-10-CM | POA: Diagnosis not present

## 2017-01-02 DIAGNOSIS — Z961 Presence of intraocular lens: Secondary | ICD-10-CM | POA: Diagnosis not present

## 2017-05-09 DIAGNOSIS — B351 Tinea unguium: Secondary | ICD-10-CM | POA: Diagnosis not present

## 2017-05-09 DIAGNOSIS — I739 Peripheral vascular disease, unspecified: Secondary | ICD-10-CM | POA: Diagnosis not present

## 2019-04-20 DEATH — deceased
